# Patient Record
Sex: Female | Born: 1945
Health system: Southern US, Community
[De-identification: ages and names within clinical notes are randomized; demographics above are authoritative.]

## PROBLEM LIST (undated history)

## (undated) DIAGNOSIS — I10 Essential (primary) hypertension: Secondary | ICD-10-CM

## (undated) DIAGNOSIS — E785 Hyperlipidemia, unspecified: Secondary | ICD-10-CM

## (undated) DIAGNOSIS — N189 Chronic kidney disease, unspecified: Secondary | ICD-10-CM

## (undated) DIAGNOSIS — I480 Paroxysmal atrial fibrillation: Secondary | ICD-10-CM

## (undated) DIAGNOSIS — M797 Fibromyalgia: Secondary | ICD-10-CM

## (undated) HISTORY — DX: Hyperlipidemia, unspecified: E78.5

## (undated) HISTORY — DX: Chronic kidney disease, unspecified: N18.9

## (undated) HISTORY — DX: Paroxysmal atrial fibrillation: I48.0

## (undated) HISTORY — DX: Essential (primary) hypertension: I10

---

## 2001-07-01 ENCOUNTER — Emergency Department (HOSPITAL_COMMUNITY): Admission: EM | Admit: 2001-07-01 | Discharge: 2001-07-01 | Payer: Self-pay | Admitting: *Deleted

## 2001-08-01 ENCOUNTER — Ambulatory Visit (HOSPITAL_COMMUNITY): Admission: RE | Admit: 2001-08-01 | Discharge: 2001-08-01 | Payer: Self-pay | Admitting: Internal Medicine

## 2001-08-01 ENCOUNTER — Encounter: Payer: Self-pay | Admitting: Internal Medicine

## 2002-08-06 ENCOUNTER — Ambulatory Visit (HOSPITAL_COMMUNITY): Admission: RE | Admit: 2002-08-06 | Discharge: 2002-08-06 | Payer: Self-pay | Admitting: Internal Medicine

## 2002-08-06 ENCOUNTER — Encounter: Payer: Self-pay | Admitting: Internal Medicine

## 2003-08-18 ENCOUNTER — Encounter: Payer: Self-pay | Admitting: Family Medicine

## 2003-08-18 ENCOUNTER — Ambulatory Visit (HOSPITAL_COMMUNITY): Admission: RE | Admit: 2003-08-18 | Discharge: 2003-08-18 | Payer: Self-pay | Admitting: Family Medicine

## 2004-04-28 ENCOUNTER — Ambulatory Visit (HOSPITAL_COMMUNITY): Admission: RE | Admit: 2004-04-28 | Discharge: 2004-04-28 | Payer: Self-pay | Admitting: Family Medicine

## 2010-11-26 ENCOUNTER — Encounter: Payer: Self-pay | Admitting: Family Medicine

## 2011-07-29 ENCOUNTER — Encounter: Payer: Self-pay | Admitting: *Deleted

## 2011-07-29 ENCOUNTER — Emergency Department (HOSPITAL_COMMUNITY)
Admission: EM | Admit: 2011-07-29 | Discharge: 2011-07-29 | Disposition: A | Discharge status: Home / Self Care | Payer: Medicare Other | Attending: Emergency Medicine | Admitting: Emergency Medicine

## 2011-07-29 ENCOUNTER — Emergency Department (HOSPITAL_COMMUNITY): Payer: Medicare Other

## 2011-07-29 DIAGNOSIS — R509 Fever, unspecified: Secondary | ICD-10-CM | POA: Insufficient documentation

## 2011-07-29 DIAGNOSIS — R05 Cough: Secondary | ICD-10-CM | POA: Insufficient documentation

## 2011-07-29 DIAGNOSIS — I10 Essential (primary) hypertension: Secondary | ICD-10-CM | POA: Insufficient documentation

## 2011-07-29 DIAGNOSIS — J3489 Other specified disorders of nose and nasal sinuses: Secondary | ICD-10-CM | POA: Insufficient documentation

## 2011-07-29 DIAGNOSIS — R059 Cough, unspecified: Secondary | ICD-10-CM | POA: Insufficient documentation

## 2011-07-29 DIAGNOSIS — R079 Chest pain, unspecified: Secondary | ICD-10-CM | POA: Insufficient documentation

## 2011-07-29 DIAGNOSIS — J159 Unspecified bacterial pneumonia: Secondary | ICD-10-CM

## 2011-07-29 MED ORDER — ALBUTEROL SULFATE HFA 108 (90 BASE) MCG/ACT IN AERS: 1.0000 | INHALATION_SPRAY | Freq: Four times a day (QID) | RESPIRATORY_TRACT | Status: DC | PRN

## 2011-07-29 MED ORDER — AZITHROMYCIN 250 MG PO TABS: 250.0000 mg | ORAL_TABLET | Freq: Every day | ORAL | Status: AC

## 2011-07-29 NOTE — ED Provider Notes (Signed)
History     CSN: [id number] Arrival date & time: 07/29/2011  6:42 AM  Chief Complaint  Patient presents with  . Cough  . Nasal Congestion  . Fever  . Nausea    HPI  (Consider location/radiation/quality/duration/timing/severity/associated sxs/prior treatment)  Patient is a 65 y.o. female presenting with cough and fever. The history is provided by the patient.  Cough This is a new problem. The current episode started 2 days ago. The problem occurs constantly. The cough is productive of sputum. There has been no fever. Associated symptoms include chest pain. Pertinent negatives include no chills, no ear pain, no headaches, no sore throat, no myalgias, no shortness of breath, no wheezing and no eye redness.  Fever Primary symptoms of the febrile illness include fever and cough. Primary symptoms do not include headaches, wheezing, shortness of breath, abdominal pain, dysuria or myalgias.  SAW DR Margo Aye ON Friday FOR THIS AND RX WITH ROBITUSSIN DM. ? FEVER BUT NOT DOCUMENTED.   Past Medical History  Diagnosis Date  . Hypertension     History reviewed. No pertinent past surgical history.  History reviewed. No pertinent family history.  History  Substance Use Topics  . Smoking status: Unknown If Ever Smoked  . Smokeless tobacco: Not on file  . Alcohol Use: No    OB History    Grav Para Term Preterm Abortions TAB SAB Ect Mult Living                  Review of Systems  Review of Systems  Constitutional: Positive for fever. Negative for chills.  HENT: Negative for ear pain and sore throat.   Eyes: Negative for redness.  Respiratory: Positive for cough. Negative for shortness of breath and wheezing.   Cardiovascular: Positive for chest pain.  Gastrointestinal: Negative for abdominal pain.  Genitourinary: Negative for dysuria.  Musculoskeletal: Negative for myalgias and back pain.  Neurological: Negative for headaches.    Allergies  Review of patient's allergies indicates  no known allergies.  Home Medications   Current Outpatient Rx  Name Route Sig Dispense Refill  . ASPIRIN 81 MG PO TABS Oral Take 81 mg by mouth daily.      Marland Kitchen UNKNOWN TO PATIENT       . ALBUTEROL SULFATE HFA 108 (90 BASE) MCG/ACT IN AERS Inhalation Inhale 1-2 puffs into the lungs every 6 (six) hours as needed for wheezing. 1 Inhaler 0  . AZITHROMYCIN 250 MG PO TABS Oral Take 1 tablet (250 mg total) by mouth daily. Take first 2 tablets together, then 1 every day until finished. 6 tablet 0    Physical Exam    BP 120/56  Pulse 79  Temp(Src) 99.3 F (37.4 C) (Oral)  Resp 20  Ht 5\' 10"  (1.778 m)  Wt 180 lb (81.647 kg)  BMI 25.83 kg/m2  SpO2 95%  Physical Exam  Nursing note and vitals reviewed. Constitutional: She is oriented to person, place, and time. She appears well-developed and well-nourished.  HENT:  Head: Normocephalic and atraumatic.  Mouth/Throat: Oropharynx is clear and moist.  Eyes: Conjunctivae and EOM are normal. Pupils are equal, round, and reactive to light.  Neck: Normal range of motion. Neck supple.  Cardiovascular: Normal rate, regular rhythm and normal heart sounds.   Pulmonary/Chest: Effort normal and breath sounds normal. No respiratory distress. She has no wheezes. She has no rales. She exhibits tenderness.  Abdominal: Soft. Bowel sounds are normal. There is no tenderness.  Musculoskeletal: Normal range of motion. She exhibits  no edema.  Neurological: She is alert and oriented to person, place, and time. No cranial nerve deficit. She exhibits normal muscle tone. Coordination normal.  Skin: Skin is warm and dry. No rash noted.    ED Course  Procedures (including critical care time)  Labs Reviewed - No data to display Dg Chest 2 View  07/29/2011  *RADIOLOGY REPORT*  Clinical Data: Cough and fever  CHEST - 2 VIEW  Comparison: None.  Findings: Focal airspace disease in the posteromedial left lower lobe.  No effusion.  Heart size upper limits normal.  Regional  bones unremarkable.  IMPRESSION:  1. Posterior left   lower lobe airspace disease suggesting pneumonia.  Original Report Authenticated By: Osa Craver, M.D.     1. Community acquired bacterial pneumonia      MDM cxr cw pneumonia  WILL RX WITH ZITHROMAX AND ALBUTEROL FOR COUGH. ALREADY TAKING ROBITUSIN DM.         Shelda Jakes, MD 07/29/11 [phone]

## 2011-07-29 NOTE — ED Notes (Signed)
Pt c/o cough, congestion, fever, and nausea x 3 days.

## 2011-10-26 ENCOUNTER — Other Ambulatory Visit (HOSPITAL_COMMUNITY): Payer: Self-pay | Admitting: Internal Medicine

## 2011-10-26 DIAGNOSIS — Z139 Encounter for screening, unspecified: Secondary | ICD-10-CM

## 2011-11-02 ENCOUNTER — Ambulatory Visit (HOSPITAL_COMMUNITY)
Admission: RE | Admit: 2011-11-02 | Discharge: 2011-11-02 | Disposition: A | Discharge status: Home / Self Care | Payer: Medicare Other | Source: Ambulatory Visit | Attending: Internal Medicine | Admitting: Internal Medicine

## 2011-11-02 DIAGNOSIS — Z1231 Encounter for screening mammogram for malignant neoplasm of breast: Secondary | ICD-10-CM | POA: Insufficient documentation

## 2011-11-02 DIAGNOSIS — Z139 Encounter for screening, unspecified: Secondary | ICD-10-CM

## 2013-09-12 ENCOUNTER — Emergency Department (HOSPITAL_COMMUNITY)
Admission: EM | Admit: 2013-09-12 | Discharge: 2013-09-12 | Disposition: A | Discharge status: Home / Self Care | Payer: Medicare Other | Attending: Emergency Medicine | Admitting: Emergency Medicine

## 2013-09-12 ENCOUNTER — Encounter (HOSPITAL_COMMUNITY): Payer: Self-pay | Admitting: Emergency Medicine

## 2013-09-12 DIAGNOSIS — I10 Essential (primary) hypertension: Secondary | ICD-10-CM | POA: Insufficient documentation

## 2013-09-12 DIAGNOSIS — M2550 Pain in unspecified joint: Secondary | ICD-10-CM | POA: Insufficient documentation

## 2013-09-12 DIAGNOSIS — Z7982 Long term (current) use of aspirin: Secondary | ICD-10-CM | POA: Insufficient documentation

## 2013-09-12 DIAGNOSIS — R197 Diarrhea, unspecified: Secondary | ICD-10-CM | POA: Insufficient documentation

## 2013-09-12 LAB — BASIC METABOLIC PANEL
CO2: 25 mEq/L (ref 19–32)
Calcium: 9.4 mg/dL (ref 8.4–10.5)
Chloride: 102 mEq/L (ref 96–112)
Creatinine, Ser: 0.98 mg/dL (ref 0.50–1.10)
Glucose, Bld: 98 mg/dL (ref 70–99)

## 2013-09-12 LAB — CBC WITH DIFFERENTIAL/PLATELET
Eosinophils Relative: 0 % (ref 0–5)
HCT: 38.5 % (ref 36.0–46.0)
Hemoglobin: 12.4 g/dL (ref 12.0–15.0)
Lymphocytes Relative: 22 % (ref 12–46)
Lymphs Abs: 1.5 10*3/uL (ref 0.7–4.0)
MCV: 88.7 fL (ref 78.0–100.0)
Monocytes Absolute: 0.7 10*3/uL (ref 0.1–1.0)
Neutro Abs: 4.8 10*3/uL (ref 1.7–7.7)
RBC: 4.34 MIL/uL (ref 3.87–5.11)
WBC: 7.1 10*3/uL (ref 4.0–10.5)

## 2013-09-12 MED ORDER — SODIUM CHLORIDE 0.9 % IV SOLN
1000.0000 mL | Freq: Once | INTRAVENOUS | Status: AC
  Administered 2013-09-12: 1000 mL via INTRAVENOUS

## 2013-09-12 MED ORDER — PB-HYOSCY-ATROPINE-SCOPOLAMINE 16.2 MG/5ML PO ELIX
10.0000 mL | ORAL_SOLUTION | Freq: Once | ORAL | Status: AC
  Administered 2013-09-12: 32.4 mg via ORAL
  Filled 2013-09-12: qty 2

## 2013-09-12 MED ORDER — SODIUM CHLORIDE 0.9 % IV SOLN
1000.0000 mL | INTRAVENOUS | Status: DC
  Administered 2013-09-12: 1000 mL via INTRAVENOUS

## 2013-09-12 NOTE — ED Notes (Signed)
Pt states "really bad diarrhea" began yesterday morning at 0200. Denies any other symptoms. States otc meds are not working.

## 2013-09-12 NOTE — ED Notes (Signed)
Pt c/o diarrhea that started yesterday am, has tried otc medication without any relief, states that she recently has finished up two rounds of antibiotics, last dose was last Tuesday, denies any n/v, pain,

## 2013-09-12 NOTE — ED Provider Notes (Signed)
CSN: [id number]     Arrival date & time 09/12/13  4098 History   First MD Initiated Contact with Patient 09/12/13 [phone]     Chief Complaint  Patient presents with  . Diarrhea   (Consider location/radiation/quality/duration/timing/severity/associated sxs/prior Treatment) Patient is a 67 y.o. female presenting with diarrhea. The history is provided by the patient.  Diarrhea Quality:  Semi-solid Severity:  Moderate Onset quality:  Sudden Duration:  1 day Timing:  Intermittent Progression:  Unchanged Relieved by:  Nothing Ineffective treatments:  Anti-motility medications Associated symptoms: arthralgias   Associated symptoms: no abdominal pain, no chills, no fever and no vomiting   Risk factors: recent antibiotic use   Risk factors: no suspicious food intake     Past Medical History  Diagnosis Date  . Hypertension    Past Surgical History  Procedure Laterality Date  . Cesarean section     No family history on file. History  Substance Use Topics  . Smoking status: Never Smoker   . Smokeless tobacco: Not on file  . Alcohol Use: No   OB History   Grav Para Term Preterm Abortions TAB SAB Ect Mult Living                 Review of Systems  Constitutional: Negative for fever, chills and activity change.       All ROS Neg except as noted in HPI  HENT: Negative for nosebleeds.   Eyes: Negative for photophobia and discharge.  Respiratory: Negative for cough, shortness of breath and wheezing.   Cardiovascular: Negative for chest pain and palpitations.  Gastrointestinal: Positive for diarrhea. Negative for vomiting, abdominal pain and blood in stool.  Genitourinary: Negative for dysuria, frequency and hematuria.  Musculoskeletal: Positive for arthralgias. Negative for back pain and neck pain.  Skin: Negative.   Neurological: Negative for dizziness, seizures and speech difficulty.  Psychiatric/Behavioral: Negative for hallucinations and confusion.    Allergies  Review of  patient's allergies indicates no known allergies.  Home Medications   Current Outpatient Rx  Name  Route  Sig  Dispense  Refill  . EXPIRED: albuterol (PROVENTIL HFA;VENTOLIN HFA) 108 (90 BASE) MCG/ACT inhaler   Inhalation   Inhale 1-2 puffs into the lungs every 6 (six) hours as needed for wheezing.   1 Inhaler   0   . aspirin 81 MG tablet   Oral   Take 81 mg by mouth daily.           Marland Kitchen UNKNOWN TO PATIENT                 BP 123/70  Pulse 70  Temp(Src) 98.4 F (36.9 C) (Oral)  Resp 16  Ht 5\' 2"  (1.575 m)  Wt 165 lb (74.844 kg)  BMI 30.17 kg/m2  SpO2 98% Physical Exam  Nursing note and vitals reviewed. Constitutional: She is oriented to person, place, and time. She appears well-developed and well-nourished.  Non-toxic appearance.  HENT:  Head: Normocephalic.  Right Ear: Tympanic membrane and external ear normal.  Left Ear: Tympanic membrane and external ear normal.  Eyes: EOM and lids are normal. Pupils are equal, round, and reactive to light.  Neck: Normal range of motion. Neck supple. Carotid bruit is not present.  Cardiovascular: Normal rate, regular rhythm, normal heart sounds, intact distal pulses and normal pulses.   Pulmonary/Chest: Breath sounds normal. No respiratory distress.  Abdominal: Soft. Bowel sounds are normal. There is tenderness. There is no guarding.  Mild diffuse tenderness of the abdomen.  Musculoskeletal: Normal range of motion.  Lymphadenopathy:       Head (right side): No submandibular adenopathy present.       Head (left side): No submandibular adenopathy present.    She has no cervical adenopathy.  Neurological: She is alert and oriented to person, place, and time. She has normal strength. No cranial nerve deficit or sensory deficit.  Skin: Skin is warm and dry.  Psychiatric: She has a normal mood and affect. Her speech is normal.    ED Course  Procedures (including critical care time) Labs Review Labs Reviewed - No data to  display Imaging Review No results found.  EKG Interpretation   None       MDM  No diagnosis found. *I have reviewed nursing notes, vital signs, and all appropriate lab and imaging results for this patient.**  Complete blood count is well within normal limits. Basic metabolic panel well within normal limits.  Patient states she feels much better after IV fluids and donnatal . There's been no diarrhea since the patient has been in the emergency department. No significant vital sign changes. IPt to return if any changes or problem.  Kathie Dike, PA-C 09/14/13 [phone]

## 2013-09-12 NOTE — ED Provider Notes (Signed)
Medical screening examination/treatment/procedure(s) were conducted as a shared visit with non-physician practitioner(s) and myself.  I personally evaluated the patient during the encounter.  EKG Interpretation   None       Patient is a 67 y.o. female who presents emergency Department diarrhea. No vomiting. No fever. No abdominal pain. She is hemodynamically stable with a benign abdominal exam. She has not had any diarrhea in the ED. Her labs are unremarkable. Plan is to discharge patient home. She does have stool cultures pending given she recently finished a course of penicillin.  Vanessa Maw Armend Hochstatter, DO 09/12/13 1621

## 2013-09-12 NOTE — ED Notes (Signed)
Pt states she has been on antibiotics lately due to sore throat then for dental procedure.

## 2013-09-23 NOTE — ED Provider Notes (Signed)
Medical screening examination/treatment/procedure(s) were conducted as a shared visit with non-physician practitioner(s) and myself.  I personally evaluated the patient during the encounter.  EKG Interpretation   None         Layla Maw Zephyr Ridley, DO 09/23/13 [phone]

## 2013-11-26 ENCOUNTER — Encounter (HOSPITAL_COMMUNITY): Payer: Self-pay | Admitting: Emergency Medicine

## 2013-11-26 ENCOUNTER — Inpatient Hospital Stay (HOSPITAL_COMMUNITY)
Admission: EM | Admit: 2013-11-26 | Discharge: 2013-11-27 | DRG: 310 | Disposition: A | Discharge status: Home / Self Care | Payer: Medicare Other | Attending: Family Medicine | Admitting: Family Medicine

## 2013-11-26 ENCOUNTER — Emergency Department (HOSPITAL_COMMUNITY): Payer: Medicare Other

## 2013-11-26 DIAGNOSIS — J101 Influenza due to other identified influenza virus with other respiratory manifestations: Secondary | ICD-10-CM | POA: Diagnosis present

## 2013-11-26 DIAGNOSIS — R059 Cough, unspecified: Secondary | ICD-10-CM | POA: Diagnosis present

## 2013-11-26 DIAGNOSIS — I10 Essential (primary) hypertension: Secondary | ICD-10-CM | POA: Diagnosis present

## 2013-11-26 DIAGNOSIS — R0789 Other chest pain: Secondary | ICD-10-CM

## 2013-11-26 DIAGNOSIS — IMO0001 Reserved for inherently not codable concepts without codable children: Secondary | ICD-10-CM | POA: Diagnosis present

## 2013-11-26 DIAGNOSIS — R05 Cough: Secondary | ICD-10-CM

## 2013-11-26 DIAGNOSIS — J111 Influenza due to unidentified influenza virus with other respiratory manifestations: Secondary | ICD-10-CM

## 2013-11-26 DIAGNOSIS — I480 Paroxysmal atrial fibrillation: Secondary | ICD-10-CM | POA: Diagnosis present

## 2013-11-26 DIAGNOSIS — I4891 Unspecified atrial fibrillation: Principal | ICD-10-CM

## 2013-11-26 HISTORY — DX: Fibromyalgia: M79.7

## 2013-11-26 HISTORY — DX: Essential (primary) hypertension: I10

## 2013-11-26 LAB — CBC WITH DIFFERENTIAL/PLATELET
Basophils Absolute: 0 10*3/uL (ref 0.0–0.1)
Basophils Relative: 0 % (ref 0–1)
EOS ABS: 0.1 10*3/uL (ref 0.0–0.7)
EOS PCT: 1 % (ref 0–5)
HCT: 41.7 % (ref 36.0–46.0)
HEMOGLOBIN: 13.4 g/dL (ref 12.0–15.0)
LYMPHS ABS: 1.4 10*3/uL (ref 0.7–4.0)
LYMPHS PCT: 17 % (ref 12–46)
MCH: 28.8 pg (ref 26.0–34.0)
MCHC: 32.1 g/dL (ref 30.0–36.0)
MCV: 89.5 fL (ref 78.0–100.0)
MONO ABS: 1 10*3/uL (ref 0.1–1.0)
Monocytes Relative: 12 % (ref 3–12)
Neutro Abs: 5.4 10*3/uL (ref 1.7–7.7)
Neutrophils Relative %: 69 % (ref 43–77)
Platelets: 179 10*3/uL (ref 150–400)
RBC: 4.66 MIL/uL (ref 3.87–5.11)
RDW: 13.9 % (ref 11.5–15.5)
WBC: 7.8 10*3/uL (ref 4.0–10.5)

## 2013-11-26 LAB — COMPREHENSIVE METABOLIC PANEL
ALT: 11 U/L (ref 0–35)
AST: 15 U/L (ref 0–37)
Albumin: 4.3 g/dL (ref 3.5–5.2)
Alkaline Phosphatase: 135 U/L — ABNORMAL HIGH (ref 39–117)
BILIRUBIN TOTAL: 0.4 mg/dL (ref 0.3–1.2)
BUN: 17 mg/dL (ref 6–23)
CALCIUM: 9.7 mg/dL (ref 8.4–10.5)
CHLORIDE: 100 meq/L (ref 96–112)
CO2: 27 meq/L (ref 19–32)
CREATININE: 0.99 mg/dL (ref 0.50–1.10)
GFR, EST AFRICAN AMERICAN: 66 mL/min — AB (ref >90)
GFR, EST NON AFRICAN AMERICAN: 57 mL/min — AB (ref >90)
GLUCOSE: 97 mg/dL (ref 70–99)
Potassium: 4.4 mEq/L (ref 3.7–5.3)
Sodium: 140 mEq/L (ref 137–147)
Total Protein: 8.1 g/dL (ref 6.0–8.3)

## 2013-11-26 LAB — TROPONIN I: Troponin I: <0.3 ng/mL (ref <0.30)

## 2013-11-26 LAB — APTT: APTT: 33 s (ref 24–37)

## 2013-11-26 LAB — INFLUENZA PANEL BY PCR (TYPE A & B)
H1N1 flu by pcr: NOT DETECTED
Influenza A By PCR: POSITIVE — AB
Influenza B By PCR: NEGATIVE

## 2013-11-26 LAB — PROTIME-INR
INR: 1.02 (ref 0.00–1.49)
PROTHROMBIN TIME: 13.2 s (ref 11.6–15.2)

## 2013-11-26 LAB — MAGNESIUM: MAGNESIUM: 2.1 mg/dL (ref 1.5–2.5)

## 2013-11-26 LAB — PRO B NATRIURETIC PEPTIDE: PRO B NATRI PEPTIDE: 135.7 pg/mL — AB (ref 0–125)

## 2013-11-26 MED ORDER — SODIUM CHLORIDE 0.9 % IV SOLN: 250.0000 mL | INTRAVENOUS | Status: DC | PRN

## 2013-11-26 MED ORDER — LISINOPRIL-HYDROCHLOROTHIAZIDE 20-25 MG PO TABS: 1.0000 | ORAL_TABLET | Freq: Every day | ORAL | Status: DC

## 2013-11-26 MED ORDER — LISINOPRIL 10 MG PO TABS
20.0000 mg | ORAL_TABLET | Freq: Every day | ORAL | Status: DC
  Administered 2013-11-27: 20 mg via ORAL
  Filled 2013-11-26 (×2): qty 2

## 2013-11-26 MED ORDER — OSELTAMIVIR PHOSPHATE 75 MG PO CAPS
75.0000 mg | ORAL_CAPSULE | Freq: Two times a day (BID) | ORAL | Status: DC
  Administered 2013-11-26 – 2013-11-27 (×2): 75 mg via ORAL
  Filled 2013-11-26 (×2): qty 1

## 2013-11-26 MED ORDER — ASPIRIN 81 MG PO CHEW
81.0000 mg | CHEWABLE_TABLET | Freq: Every day | ORAL | Status: DC
  Administered 2013-11-26 – 2013-11-27 (×2): 81 mg via ORAL
  Filled 2013-11-26 (×2): qty 1

## 2013-11-26 MED ORDER — SODIUM CHLORIDE 0.9 % IJ SOLN
3.0000 mL | Freq: Two times a day (BID) | INTRAMUSCULAR | Status: DC
  Administered 2013-11-26: 3 mL via INTRAVENOUS

## 2013-11-26 MED ORDER — GUAIFENESIN-DM 100-10 MG/5ML PO SYRP
5.0000 mL | ORAL_SOLUTION | ORAL | Status: DC | PRN
  Administered 2013-11-26 – 2013-11-27 (×3): 5 mL via ORAL
  Filled 2013-11-26 (×3): qty 5

## 2013-11-26 MED ORDER — ENOXAPARIN SODIUM 80 MG/0.8ML ~~LOC~~ SOLN
1.0000 mg/kg | Freq: Two times a day (BID) | SUBCUTANEOUS | Status: DC
Start: 2013-11-26 — End: 2013-11-27
  Administered 2013-11-26 – 2013-11-27 (×2): 80 mg via SUBCUTANEOUS
  Filled 2013-11-26 (×2): qty 0.8

## 2013-11-26 MED ORDER — SODIUM CHLORIDE 0.9 % IJ SOLN: 3.0000 mL | Freq: Two times a day (BID) | INTRAMUSCULAR | Status: DC

## 2013-11-26 MED ORDER — DILTIAZEM HCL 100 MG IV SOLR
5.0000 mg/h | Freq: Once | INTRAVENOUS | Status: DC
  Filled 2013-11-26: qty 100

## 2013-11-26 MED ORDER — ONDANSETRON HCL 4 MG/2ML IJ SOLN: 4.0000 mg | Freq: Four times a day (QID) | INTRAMUSCULAR | Status: DC | PRN

## 2013-11-26 MED ORDER — ALBUTEROL SULFATE (2.5 MG/3ML) 0.083% IN NEBU: 3.0000 mL | INHALATION_SOLUTION | Freq: Four times a day (QID) | RESPIRATORY_TRACT | Status: DC | PRN

## 2013-11-26 MED ORDER — DILTIAZEM HCL 25 MG/5ML IV SOLN: 10.0000 mg | Freq: Once | INTRAVENOUS | Status: DC

## 2013-11-26 MED ORDER — SODIUM CHLORIDE 0.9 % IJ SOLN: 3.0000 mL | INTRAMUSCULAR | Status: DC | PRN

## 2013-11-26 MED ORDER — ONDANSETRON HCL 4 MG PO TABS
4.0000 mg | ORAL_TABLET | Freq: Four times a day (QID) | ORAL | Status: DC | PRN
Start: 2013-11-26 — End: 2013-11-27

## 2013-11-26 MED ORDER — HYDROCHLOROTHIAZIDE 25 MG PO TABS
25.0000 mg | ORAL_TABLET | Freq: Every day | ORAL | Status: DC
  Administered 2013-11-27: 25 mg via ORAL
  Filled 2013-11-26 (×2): qty 1

## 2013-11-26 NOTE — ED Notes (Signed)
EKG shown to Dr. Tomi Bamberger, Lab called for stat blood work, will move pt in room 2 asap.

## 2013-11-26 NOTE — ED Provider Notes (Signed)
CSN: [id number]     Arrival date & time 11/26/13  1651 History  This chart was scribed for Janice Norrie, MD by Roxan Diesel, ED scribe.  This patient was seen in room APA02/APA02 and the patient's care was started at 5:29 PM.   Chief Complaint  Patient presents with  . Atrial Fibrillation    The history is provided by the patient. No language interpreter was used.    HPI Comments: Vanessa Anderson is a 68 y.o. female with h/o HTN sent from PCP's office to the Emergency Department complaining of atrial fibrillation.  Pt was seen by her PCP this evening for a cough that began yesterday and was told that an EKG shows new-onset rapid A-fib and PCP was unable to rule out ischemic changes.  She was sent here for further evaluation.  Currently her A-fib has resolved since she was placed in her room after triage.  Pt denies any symptoms associated with A-fib.  She notes some central CP from coughing but denies any other CP.  She denies palpitations, leg swelling, SOB, nausea, vomiting, lightheadedness, dizziness, exertional SOB, or orthopnea or PND.  She states she does not feel any differently currently than when she was in A-fib.  She states she had a scratchy throat yesterday but this has since resolved.  Pt reports she was seen by her PCP for a physical 6 days ago and told at that visit that she has an "irregular beat but nothing to worry about."  She had never been told of any other heart problems before that time and has never seen a cardiologist. She does not smoke or drink.  She lives at home with her husband.    PCP Dr [name]   Past Medical History  Diagnosis Date  . Hypertension     Past Surgical History  Procedure Laterality Date  . Cesarean section      No family history on file.   History  Substance Use Topics  . Smoking status: Never Smoker   . Smokeless tobacco: Not on file  . Alcohol Use: No  lives at home Lives with spouse  OB History   Grav Para Term Preterm Abortions  TAB SAB Ect Mult Living                  Review of Systems  HENT: Positive for sore throat.   Respiratory: Positive for cough. Negative for shortness of breath.   Cardiovascular: Positive for chest pain (only on coughing). Negative for palpitations and leg swelling.  Gastrointestinal: Negative for nausea and vomiting.  Neurological: Negative for dizziness and light-headedness.  All other systems reviewed and are negative.     Allergies  Review of patient's allergies indicates no known allergies.  Home Medications   Current Outpatient Rx  Name  Route  Sig  Dispense  Refill  . albuterol (PROVENTIL HFA;VENTOLIN HFA) 108 (90 BASE) MCG/ACT inhaler   Inhalation   Inhale 2 puffs into the lungs every 6 (six) hours as needed for wheezing or shortness of breath.         Marland Kitchen aspirin 81 MG tablet   Oral   Take 81 mg by mouth daily.          Marland Kitchen lisinopril-hydrochlorothiazide (PRINZIDE,ZESTORETIC) 20-25 MG per tablet   Oral   Take 1 tablet by mouth daily.          BP 155/64  Pulse 164  Temp(Src) 100.2 F (37.9 C) (Oral)  Resp 18  Ht  5\' 2"  (1.575 m)  Wt 175 lb (79.379 kg)  BMI 32.00 kg/m2  SpO2 99%   Vital signs normal except low grade temp, tachycardia   Physical Exam  Nursing note and vitals reviewed. Constitutional: She is oriented to person, place, and time. She appears well-developed and well-nourished.  Non-toxic appearance. She does not appear ill. No distress.  HENT:  Head: Normocephalic and atraumatic.  Right Ear: External ear normal.  Left Ear: External ear normal.  Nose: Nose normal. No mucosal edema or rhinorrhea.  Mouth/Throat: Oropharynx is clear and moist and mucous membranes are normal. No dental abscesses or uvula swelling.  Eyes: Conjunctivae and EOM are normal. Pupils are equal, round, and reactive to light.  Neck: Normal range of motion and full passive range of motion without pain. Neck supple.  Cardiovascular: Normal rate, regular rhythm and  normal heart sounds.  Exam reveals no gallop and no friction rub.   No murmur heard. In normal sinus rhythm  Pulmonary/Chest: Effort normal and breath sounds normal. No respiratory distress. She has no wheezes. She has no rhonchi. She has no rales. She exhibits no tenderness and no crepitus.  Abdominal: Soft. Normal appearance and bowel sounds are normal. She exhibits no distension. There is no tenderness. There is no rebound and no guarding.  Musculoskeletal: Normal range of motion. She exhibits no edema and no tenderness.  Moves all extremities well.   Neurological: She is alert and oriented to person, place, and time. She has normal strength. No cranial nerve deficit.  Skin: Skin is warm, dry and intact. No rash noted. No erythema. No pallor.  Psychiatric: She has a normal mood and affect. Her speech is normal and behavior is normal. Her mood appears not anxious.    ED Course  Procedures (including critical care time) Medications  diltiazem (CARDIZEM) injection 10 mg (not administered)  diltiazem (CARDIZEM) 100 mg in dextrose 5 % 100 mL infusion (not administered)   These were ordered but held because pt was in NSR after placed in ED room  DIAGNOSTIC STUDIES: Oxygen Saturation is 99% on room air, normal by my interpretation.    COORDINATION OF CARE: 5:34 PM-Discussed treatment plan which includes CXR and labs with pt at bedside and pt agreed to plan.   When I entered the room to see the patient she was on the monitor. She was noted to be in normal sinus rhythm although her triage EKG had shown atrial fibrillation with fast ventricular response.  Pt remained in NSR. Influenza panel ordered with her respiratory symptoms x 2 days and low grade fever in the ED.   18:36 Dr Sarajane Jews, admit to observation, tele, team 1   Labs Review Results for orders placed during the hospital encounter of 11/26/13  CBC WITH DIFFERENTIAL      Result Value Range   WBC 7.8  4.0 - 10.5 K/uL   RBC 4.66   3.87 - 5.11 MIL/uL   Hemoglobin 13.4  12.0 - 15.0 g/dL   HCT 41.7  36.0 - 46.0 %   MCV 89.5  78.0 - 100.0 fL   MCH 28.8  26.0 - 34.0 pg   MCHC 32.1  30.0 - 36.0 g/dL   RDW 13.9  11.5 - 15.5 %   Platelets 179  150 - 400 K/uL   Neutrophils Relative % 69  43 - 77 %   Neutro Abs 5.4  1.7 - 7.7 K/uL   Lymphocytes Relative 17  12 - 46 %   Lymphs Abs  1.4  0.7 - 4.0 K/uL   Monocytes Relative 12  3 - 12 %   Monocytes Absolute 1.0  0.1 - 1.0 K/uL   Eosinophils Relative 1  0 - 5 %   Eosinophils Absolute 0.1  0.0 - 0.7 K/uL   Basophils Relative 0  0 - 1 %   Basophils Absolute 0.0  0.0 - 0.1 K/uL  TROPONIN I      Result Value Range   Troponin I <0.30  <0.30 ng/mL  COMPREHENSIVE METABOLIC PANEL      Result Value Range   Sodium 140  137 - 147 mEq/L   Potassium 4.4  3.7 - 5.3 mEq/L   Chloride 100  96 - 112 mEq/L   CO2 27  19 - 32 mEq/L   Glucose, Bld 97  70 - 99 mg/dL   BUN 17  6 - 23 mg/dL   Creatinine, Ser 0.99  0.50 - 1.10 mg/dL   Calcium 9.7  8.4 - 10.5 mg/dL   Total Protein 8.1  6.0 - 8.3 g/dL   Albumin 4.3  3.5 - 5.2 g/dL   AST 15  0 - 37 U/L   ALT 11  0 - 35 U/L   Alkaline Phosphatase 135 (*) 39 - 117 U/L   Total Bilirubin 0.4  0.3 - 1.2 mg/dL   GFR calc non Af Amer 57 (*) >90 mL/min   GFR calc Af Amer 66 (*) >90 mL/min  APTT      Result Value Range   aPTT 33  24 - 37 seconds  PROTIME-INR      Result Value Range   Prothrombin Time 13.2  11.6 - 15.2 seconds   INR 1.02  0.00 - 1.49  MAGNESIUM      Result Value Range   Magnesium 2.1  1.5 - 2.5 mg/dL  PRO B NATRIURETIC PEPTIDE      Result Value Range   Pro B Natriuretic peptide (BNP) 135.7 (*) 0 - 125 pg/mL   Laboratory interpretation all normal, thyroid tests pending    Imaging Review Dg Chest Portable 1 View  11/26/2013   CLINICAL DATA:  Cough, hypertension  EXAM: PORTABLE CHEST - 1 VIEW  COMPARISON:  July 29, 2011  FINDINGS: The heart size and mediastinal contours are within normal limits. Both lungs are clear.  The visualized skeletal structures are stable.  IMPRESSION: No active disease.   Electronically Signed   By: Abelardo Diesel M.D.   On: 11/26/2013 18:00    #1 EKG Interpretation    Date/Time:  Thursday November 26 2013 17:00:40 EST Ventricular Rate:  149 PR Interval:    QRS Duration: 80 QT Interval:  278 QTC Calculation: 437 R Axis:   58 Text Interpretation:  Atrial fibrillation with rapid ventricular response ST \\T \ T wave abnormality, consider inferior ischemia No previous ECGs available Confirmed by Nakeia Calvi  MD-I, Braelynn Benning ([id number]) on 11/26/2013 5:03:22 PM           #2 EKG Interpretation    Date/Time:  Thursday November 26 2013 17:30:25 EST Ventricular Rate:  89 PR Interval:  132 QRS Duration: 84 QT Interval:  326 QTC Calculation: 396 R Axis:   61 Text Interpretation:  Normal sinus rhythm Possible Left atrial enlargement Nonspecific ST abnormality When compared with ECG of 26-Nov-2013 17:00, Sinus rhythm has replaced Atrial fibrillation Vent. rate has decreased BY  60 BPM T wave inversion no longer evident in Inferior leads T wave inversion no longer evident in Lateral leads Confirmed by  Clifford Benninger  MD-I, Tarryn Bogdan ([id number]) on 11/26/2013 6:20:44 PM              MDM   1. Cough   2. Chest wall pain   3. Atrial fibrillation with RVR     Plan admission   Rolland Porter, MD, FACEP   I personally performed the services described in this documentation, which was scribed in my presence. The recorded information has been reviewed and considered.  Rolland Porter, MD, FACEP    Janice Norrie, MD 11/26/13 [phone]

## 2013-11-26 NOTE — H&P (Signed)
PCP:   [name], MD   Chief Complaint:  Cough uri  HPI: 68 yo female sent here from pcp for afib with rvr in 160s.  On arrival to ED she had converted spontaneously to nsr.  Pt has started with cough, nasal congestion yesterday.  Low grade fever.  No n/v/d.  No abd pain.  Has chest pain associated with her coughing.  No sick contacts.  No flu vaccine this year.  Cough nonproductive.  Denies malaise or weakness.  No sob.  Review of Systems:  Positive and negative as per HPI otherwise all other systems are negative  Past Medical History: Past Medical History  Diagnosis Date  . Hypertension   . Fibromyalgia    Past Surgical History  Procedure Laterality Date  . Cesarean section      Medications: Prior to Admission medications   Medication Sig Start Date End Date Taking? Authorizing Provider  albuterol (PROVENTIL HFA;VENTOLIN HFA) 108 (90 BASE) MCG/ACT inhaler Inhale 2 puffs into the lungs every 6 (six) hours as needed for wheezing or shortness of breath.   Yes Historical Provider, MD  aspirin 81 MG tablet Take 81 mg by mouth daily.    Yes Historical Provider, MD  lisinopril-hydrochlorothiazide (PRINZIDE,ZESTORETIC) 20-25 MG per tablet Take 1 tablet by mouth daily.   Yes Historical Provider, MD    Allergies:  No Known Allergies  Social History:  reports that she has never smoked. She has never used smokeless tobacco. She reports that she does not drink alcohol or use illicit drugs.  Family History: none  Physical Exam: Filed Vitals:   11/26/13 1655 11/26/13 1830 11/26/13 1900 11/26/13 2000  BP: 155/64 123/53 121/53 125/74  Pulse: 164 87 82 77  Temp: 100.2 F (37.9 C)   100.7 F (38.2 C)  TempSrc: Oral   Oral  Resp: 18 16 13 20   Height: 5\' 2"  (1.575 m)   5\' 2"  (1.575 m)  Weight: 79.379 kg (175 lb)   80.2 kg (176 lb 12.9 oz)  SpO2: 99% 100% 97% 97%   General appearance: alert, cooperative and no distress Head: Normocephalic, without obvious abnormality,  atraumatic Eyes: negative Nose: Nares normal. Septum midline. Mucosa normal. No drainage or sinus tenderness. Neck: no JVD and supple, symmetrical, trachea midline Lungs: clear to auscultation bilaterally Heart: regular rate and rhythm, S1, S2 normal, no murmur, click, rub or gallop Abdomen: soft, non-tender; bowel sounds normal; no masses,  no organomegaly Extremities: extremities normal, atraumatic, no cyanosis or edema Pulses: 2+ and symmetric Skin: Skin color, texture, turgor normal. No rashes or lesions Neurologic: Grossly normal   Labs on Admission:   Recent Labs  11/26/13 1705 11/26/13 1709  NA  --  140  K  --  4.4  CL  --  100  CO2  --  27  GLUCOSE  --  97  BUN  --  17  CREATININE  --  0.99  CALCIUM  --  9.7  MG 2.1  --     Recent Labs  11/26/13 1709  AST 15  ALT 11  ALKPHOS 135*  BILITOT 0.4  PROT 8.1  ALBUMIN 4.3    Recent Labs  11/26/13 1705  WBC 7.8  NEUTROABS 5.4  HGB 13.4  HCT 41.7  MCV 89.5  PLT 179    Recent Labs  11/26/13 1705  TROPONINI <0.30   Radiological Exams on Admission: Dg Chest Portable 1 View  11/26/2013   CLINICAL DATA:  Cough, hypertension  EXAM: PORTABLE CHEST -  1 VIEW  COMPARISON:  July 29, 2011  FINDINGS: The heart size and mediastinal contours are within normal limits. Both lungs are clear. The visualized skeletal structures are stable.  IMPRESSION: No active disease.   Electronically Signed   By: Abelardo Diesel M.D.   On: 11/26/2013 18:00    Assessment/Plan  68 yo female with pafib in/out rvr likely due to influenza illness  Principal Problem:   Atrial fibrillation with rapid ventricular response- now nsr.  Full dose lovenox.  Echo in am.  Tele monitoring for now.  Does have remote h/o afib several years ago.  Active Problems:   Influenza A-  tamiflu started.  Flu pcr positive influenza A.   Hypertension  stable   Cough as above   Paroxysmal a-fib  As above    Vanessa Anderson A 11/26/2013, 9:23 PM

## 2013-11-26 NOTE — ED Notes (Signed)
Patient and family members educated on isolation precaution (mask, handwashing.)

## 2013-11-26 NOTE — ED Notes (Signed)
Late note:  Hold Cardizem for present- patient in NSR  Dr Tomi Bamberger

## 2013-11-26 NOTE — ED Notes (Signed)
Dr. Juel Burrow NP called and reports pt was being seen today for cough x 2 days.  Reports  EKG shows new onset rapid afib and cannot rule out ischemic changes.  PT sent here for evaluation.  Pt denies any chest pain or sob at this time.  Pt says has some chest pain with coughing.   Also reports fever.

## 2013-11-27 ENCOUNTER — Encounter (HOSPITAL_COMMUNITY): Payer: Self-pay | Admitting: Cardiology

## 2013-11-27 DIAGNOSIS — I519 Heart disease, unspecified: Secondary | ICD-10-CM

## 2013-11-27 LAB — TROPONIN I
Troponin I: <0.3 ng/mL (ref <0.30)
Troponin I: <0.3 ng/mL (ref <0.30)

## 2013-11-27 LAB — T4: T4, Total: 8.5 ug/dL (ref 5.0–12.5)

## 2013-11-27 LAB — TSH: TSH: 3.534 u[IU]/mL (ref 0.350–4.500)

## 2013-11-27 MED ORDER — ACETAMINOPHEN 500 MG PO TABS
500.0000 mg | ORAL_TABLET | Freq: Four times a day (QID) | ORAL | Status: DC | PRN
  Administered 2013-11-27 (×2): 500 mg via ORAL
  Filled 2013-11-27 (×2): qty 1

## 2013-11-27 MED ORDER — GUAIFENESIN-DM 100-10 MG/5ML PO SYRP: 5.0000 mL | ORAL_SOLUTION | ORAL | Status: DC | PRN

## 2013-11-27 MED ORDER — OSELTAMIVIR PHOSPHATE 75 MG PO CAPS: 75.0000 mg | ORAL_CAPSULE | Freq: Two times a day (BID) | ORAL | Status: DC

## 2013-11-27 MED ORDER — LISINOPRIL-HYDROCHLOROTHIAZIDE 20-25 MG PO TABS: 1.0000 | ORAL_TABLET | Freq: Every day | ORAL | Status: DC

## 2013-11-27 NOTE — Discharge Summary (Signed)
Patient seen, independently examined and chart reviewed. I agree with exam, assessment and plan discussed with Dyanne Carrel, NP.  Late entry.  68 year old woman sent to the emergency department from primary care physician's office for atrial fibrillation with rapid ventricular response. In the emergency department she converted spontaneously to sinus rhythm prior to treatment. She reported history of upper respiratory symptoms and low-grade fever. Chest pain associated with coughing only.  Admitted for atrial fibrillation with rapid ventricular response, transient and influenza.  PMH HTN Fibromyalgia   Evaluated by cardiology today. She still some cough but feels okay.  Low-grade fever, MAXIMUM TEMPERATURE 100.7. Vitals stable no hypoxia. Appears calm and comfortable. Cardiovascular regular rate and rhythm. Respiratory clear to auscultation bilaterally. Telemetry sinus rhythm.  Basic metabolic panel unremarkable. Troponins negative. CBC normal. TSH normal. Influenza screen positive. Left ventricular ejection fraction 60-65%. Normal wall motion. Grade 2 diastolic dysfunction.  Paroxysmal atrial fibrillation, likely related to acute influenza. CHADS score of 1 and CHADSVASC of 3. Cardiology recommends no anticoagulation and no rate control, followup as an outpatient after she has recovered from influenza. She takes aspirin chronically.  Complete Tamiflu as outpatient.  Stable for discharge  Murray Hodgkins, MD [facility] [phone]

## 2013-11-27 NOTE — Consult Note (Signed)
CARDIOLOGY CONSULT NOTE   Patient ID: Vanessa Anderson MRN: [id number] DOB/AGE: [birth year] 68 y.o.  Admit Date: 11/26/2013 Referring Physician: PTH Primary Physician: [name], MD Consulting Cardiologist: Rozann Lesches MD Reason for Consultation: PAF   Clinical Summary Vanessa Anderson is a 68 y.o.female admitted with atrial fibrillation with RVR after presenting to PCP's office for complaints of dry cough, which had been occuring for two days. She took OTC Mucinex, but cough was non-productive. She was found to be tachycardic in PCP office. EKG demonstrated atrial fibrillation with RVR. She was sent to ER. She was unaware of rapid irregular rhythm.    On arrival BP 155/64, HR 164. Temp of 100.2. She spontaneously converted to NSR without medical intervention and has remained in NSR since that time. CXR was unremarkable. Cardiac markers have been negative for ACS. No metabolic or hematologic lab abnormalities.She is being treated for inifluenza A - positive screen. Coughing has subsided. Echo has been ordered.     She is normally in good health with the exception of hypertension history. She has only been hospitalized for birth of children..    No Known Allergies  Medications Scheduled Medications: . aspirin  81 mg Oral Daily  . enoxaparin (LOVENOX) injection  1 mg/kg Subcutaneous Q12H  . lisinopril  20 mg Oral Daily  . oseltamivir  75 mg Oral BID  . sodium chloride  3 mL Intravenous Q12H  . sodium chloride  3 mL Intravenous Q12H    PRN Medications: sodium chloride, acetaminophen, albuterol, guaiFENesin-dextromethorphan, ondansetron (ZOFRAN) IV, ondansetron, sodium chloride   Past Medical History  Diagnosis Date  . Essential hypertension, benign   . Fibromyalgia     Past Surgical History  Procedure Laterality Date  . Cesarean section      History reviewed. No pertinent family history.  Social History Vanessa Anderson reports that she has never smoked. She has never used  smokeless tobacco. Vanessa Anderson reports that she does not drink alcohol.  Review of Systems Otherwise reviewed and negative except as outlined.  Physical Examination Blood pressure 117/67, pulse 70, temperature 98.8 F (37.1 C), temperature source Oral, resp. rate 20, height 5\' 2"  (1.575 m), weight 176 lb 12.9 oz (80.2 kg), SpO2 96.00%.  Intake/Output Summary (Last 24 hours) at 11/27/13 1151 Last data filed at 11/27/13 0800  Gross per 24 hour  Intake    720 ml  Output      0 ml  Net    720 ml    Telemetry: NSR   Appears comfortable. HEENT: Conjunctiva and lids normal, oropharynx clear. Neck: Supple, no elevated JVP or carotid bruits, no thyromegaly. Lungs: Clear to auscultation, nonlabored breathing at rest. Cardiac: Regular rate and rhythm, no S3 or significant systolic murmur, no pericardial rub. Abdomen: Soft, nontender, bowel sounds present. Extremities: No pitting edema, distal pulses 2+. Skin: Warm and dry. Musculoskeletal: No kyphosis. Neuropsychiatric: Alert and oriented x3, affect grossly appropriate.   Lab Results  Basic Metabolic Panel:  Recent Labs Lab 11/26/13 1705 11/26/13 1709  NA  --  140  K  --  4.4  CL  --  100  CO2  --  27  GLUCOSE  --  97  BUN  --  17  CREATININE  --  0.99  CALCIUM  --  9.7  MG 2.1  --     Liver Function Tests:  Recent Labs Lab 11/26/13 1709  AST 15  ALT 11  ALKPHOS 135*  BILITOT 0.4  PROT 8.1  ALBUMIN 4.3    CBC:  Recent Labs Lab 11/26/13 1705  WBC 7.8  NEUTROABS 5.4  HGB 13.4  HCT 41.7  MCV 89.5  PLT 179    Cardiac Enzymes:  Recent Labs Lab 11/26/13 1705 11/26/13 2210 11/27/13 0321 11/27/13 0923  TROPONINI <0.30 <0.30 <0.30 <0.30    Radiology: Dg Chest Portable 1 View  11/26/2013   CLINICAL DATA:  Cough, hypertension  EXAM: PORTABLE CHEST - 1 VIEW  COMPARISON:  July 29, 2011  FINDINGS: The heart size and mediastinal contours are within normal limits. Both lungs are clear. The visualized  skeletal structures are stable.  IMPRESSION: No active disease.   Electronically Signed   By: Abelardo Diesel M.D.   On: 11/26/2013 18:00    WTU:UEKCMK fib with RVR, with inferior T-wave concavity 149 bpm.    Impression and Recommendations  1. PAF: Likely related to acute illness with febrile state.Marland Kitchen She was unaware of rapid irregular HR. This may have been occuring with associated dry cough over the last two days prior to admission. Since returning to NSR, coughing has almost completely been eliminated,. CHADS score of 1 and CHADSVASC  of 3. Currently in NSR. Echo is pending. Do not think she needs to be on anticoagulation at this time. Will not add rate control medications.  2. Hypertension:  Blood pressure is well controlled to low range on lisinopril and HCTZ. Would stop HCTZ while febrile to avoid dehydration.   3. Flu-Like Symptoms: Continues on Tamiflu and breathing treatments with bedrest. Temp normal this am.  Signed: Phill Myron. Purcell Nails NP [facility] 11/27/2013, 11:51 AM Co-Sign MD   Attending note:  Patient seen and examined. Reviewed records and modified above note by [name] NP. Patient is admitted with recent symptoms of cough and chest congestion, noted to be tachycardiac at PCP office and sent to the ER where she was found to be in rapid atrial fibrillation. She was not aware of any sense of palpitations or chest pain. She spontaneously converted to sinus rhythm without specific intervention, where she remains today on telemetry. Cardiac markers argue against ACS. ECG in sinus rhythm shows leftward axis with nonspecific ST changes. Review of the tracing showing tachycardia finds possible atrial fibrillation following initial sinus rhythm, hard to exclude atrial tachycardia with some regularity in atrial activity. Plan is to followup on echocardiogram today. If no structural abnormalities of significance are noted, would recommend observation for now. She is being  treated for influenza A. When she has recovered, we can see her back in the office to determine if any further monitoring is necessary. She has not been aware of any palpitations, so still possible that she may have paroxysmal arrhythmias that has never been uncovered. Her thromboembolic risk score is relatively low as outlined above, no anticoaguation for now - can discuss at followup.  Satira Sark, M.D., F.A.C.C.

## 2013-11-27 NOTE — Discharge Summary (Signed)
Physician Discharge Summary  Vanessa Anderson FAO:[id number] DOB: April 06, 1946 DOA: 11/26/2013  PCP: [name], MD  Admit date: 11/26/2013 Discharge date: 11/27/2013  Time spent: 45  minutes  Recommendations for Outpatient Follow-up:  1. PCP Dr [name] in 1-2 weeks for evaluation cough/flu and monitoring of HR and BP 2. [name] cardiology on 2/13 to determine if further monitoring needed and get op echo.   Discharge Diagnoses:  Principal Problem:   Atrial fibrillation with rapid ventricular response Active Problems:   Influenza A   Hypertension   Cough   Paroxysmal a-fib   Discharge Condition: stable  Diet recommendation: heart healthy  Filed Weights   11/26/13 1655 11/26/13 2000  Weight: 79.379 kg (175 lb) 80.2 kg (176 lb 12.9 oz)    History of present illness:  68 yo female sent to ED on 11/26/13 from pcp for afib with rvr in 160s. On arrival to ED she had converted spontaneously to nsr. Pt had started with cough, nasal congestion the day before. Low grade fever. No n/v/d. No abd pain. Had chest pain associated with her coughing. No sick contacts. No flu vaccine this year. Cough nonproductive. Denied malaise or weakness. No sob.    Hospital Course:  Atrial fibrillation with rapid ventricular response-  Converted to  NSR in ED without medical intervention. Chest xray unremarkable,TSH within the limits of normal.  Troponin negative x3. Echo to be done as outpatient per cardiology. Remained in nsr for hospitalization. Does have remote h/o afib several years ago. Evaluated by cardiology who opine that review of the tracing showing tachycardia finds possible atrial fibrillation following initial sinus rhythm, hard to exclude atrial tachycardia with some regularity in atrial activity. Pt not aware of any palpitations so possible that she may have paroxysmal arrhythmias. Cardiology recommend no anticoagulation at this time as thromboembolic risk score relatively low. Will follow up  with cardiology for possible further monitoring 12/18/13.    Active Problems:  Influenza A- tamiflu started. Flu pcr positive influenza A. Will continue for 5 day completion. Temp 99.3 at discharge. VSS. Non-toxic appearing. Instructed to maintain hydration with po fluids. Will hold lisinopril and HCTZ until 11/19/13 to avoid dehydration. SBP range at discharge 99-117. Follow up with PCP 1 week.   Hypertension stable: see above Cough: provided with robitussin DM. Much improved at discharge. Oxygen saturation level 96% on room air   Procedures:  Echo  Consultations:  Dr [name] cardiology  Discharge Exam: Filed Vitals:   11/27/13 1300  BP: 117/68  Pulse: 60  Temp: 99.3 F (37.4 C)  Resp: 18    General: well nourished appears comfortable Cardiovascular: RRR No MGR  No LE edema Respiratory: normal effort BS clear bilaterally to ausculation no wheeze or rhonchi  Discharge Instructions     Medication List         albuterol 108 (90 BASE) MCG/ACT inhaler  Commonly known as:  PROVENTIL HFA;VENTOLIN HFA  Inhale 2 puffs into the lungs every 6 (six) hours as needed for wheezing or shortness of breath.     aspirin 81 MG tablet  Take 81 mg by mouth daily.     guaiFENesin-dextromethorphan 100-10 MG/5ML syrup  Commonly known as:  ROBITUSSIN DM  Take 5 mLs by mouth every 4 (four) hours as needed for cough.     lisinopril-hydrochlorothiazide 20-25 MG per tablet  Commonly known as:  PRINZIDE,ZESTORETIC  Take 1 tablet by mouth daily.  Start taking on:  11/29/2013     oseltamivir 75 MG  capsule  Commonly known as:  TAMIFLU  Take 1 capsule (75 mg total) by mouth 2 (two) times daily.       No Known Allergies    The results of significant diagnostics from this hospitalization (including imaging, microbiology, ancillary and laboratory) are listed below for reference.    Significant Diagnostic Studies: Dg Chest Portable 1 View  11/26/2013   CLINICAL DATA:  Cough, hypertension   EXAM: PORTABLE CHEST - 1 VIEW  COMPARISON:  July 29, 2011  FINDINGS: The heart size and mediastinal contours are within normal limits. Both lungs are clear. The visualized skeletal structures are stable.  IMPRESSION: No active disease.   Electronically Signed   By: Abelardo Diesel M.D.   On: 11/26/2013 18:00    Microbiology: No results found for this or any previous visit (from the past 240 hour(s)).   Labs: Basic Metabolic Panel:  Recent Labs Lab 11/26/13 1705 11/26/13 1709  NA  --  140  K  --  4.4  CL  --  100  CO2  --  27  GLUCOSE  --  97  BUN  --  17  CREATININE  --  0.99  CALCIUM  --  9.7  MG 2.1  --    Liver Function Tests:  Recent Labs Lab 11/26/13 1709  AST 15  ALT 11  ALKPHOS 135*  BILITOT 0.4  PROT 8.1  ALBUMIN 4.3   No results found for this basename: LIPASE, AMYLASE,  in the last 168 hours No results found for this basename: AMMONIA,  in the last 168 hours CBC:  Recent Labs Lab 11/26/13 1705  WBC 7.8  NEUTROABS 5.4  HGB 13.4  HCT 41.7  MCV 89.5  PLT 179   Cardiac Enzymes:  Recent Labs Lab 11/26/13 1705 11/26/13 2210 11/27/13 0321 11/27/13 0923  TROPONINI <0.30 <0.30 <0.30 <0.30   BNP: BNP (last 3 results)  Recent Labs  11/26/13 1705  PROBNP 135.7*   CBG: No results found for this basename: GLUCAP,  in the last 168 hours     Signed:  Radene Gunning  [facility] 11/27/2013, 1:55 PM

## 2013-11-27 NOTE — Plan of Care (Signed)
Problem: Discharge Progression Outcomes Goal: Complications resolved/controlled Outcome: Adequate for Discharge Tamiflu ordered

## 2013-11-27 NOTE — Progress Notes (Signed)
Utilization Review Complete  

## 2013-11-27 NOTE — Care Management Note (Signed)
    Page 1 of 1   11/27/2013     2:48:51 PM   CARE MANAGEMENT NOTE 11/27/2013  Patient:  Vanessa Anderson, Vanessa Anderson   Account Number:  [id number]  Date Initiated:  11/27/2013  Documentation initiated by:  Claretha Cooper  Subjective/Objective Assessment:   Pt from home where she lives alone. Independent ADL. Plan to return home. No needs identified or anticipated     Action/Plan:   Anticipated DC Date:     Anticipated DC Plan:  [place]  CM consult      Choice offered to / List presented to:             Status of service:  Completed, signed off Medicare Important Message given?   (If response is "NO", the following Medicare IM given date fields will be blank) Date Medicare IM given:   Date Additional Medicare IM given:    Discharge Disposition:    Per UR Regulation:    If discussed at Long Length of Stay Meetings, dates discussed:    Comments:  11/27/13 Claretha Cooper RN BSN CM

## 2013-12-18 ENCOUNTER — Encounter: Payer: Self-pay | Admitting: Adult Health

## 2013-12-18 ENCOUNTER — Ambulatory Visit (INDEPENDENT_AMBULATORY_CARE_PROVIDER_SITE_OTHER): Payer: Medicare Other | Admitting: Adult Health

## 2013-12-18 VITALS — BP 138/66 | HR 66 | Ht 62.0 in | Wt 173.0 lb

## 2013-12-18 DIAGNOSIS — I10 Essential (primary) hypertension: Secondary | ICD-10-CM

## 2013-12-18 DIAGNOSIS — J111 Influenza due to unidentified influenza virus with other respiratory manifestations: Secondary | ICD-10-CM

## 2013-12-18 DIAGNOSIS — I4891 Unspecified atrial fibrillation: Secondary | ICD-10-CM

## 2013-12-18 DIAGNOSIS — I480 Paroxysmal atrial fibrillation: Secondary | ICD-10-CM

## 2013-12-18 DIAGNOSIS — J101 Influenza due to other identified influenza virus with other respiratory manifestations: Secondary | ICD-10-CM

## 2013-12-18 MED ORDER — AMLODIPINE BESYLATE 5 MG PO TABS: 5.0000 mg | ORAL_TABLET | Freq: Every day | ORAL | Status: DC

## 2013-12-18 MED ORDER — HYDROCHLOROTHIAZIDE 25 MG PO TABS: 25.0000 mg | ORAL_TABLET | Freq: Every day | ORAL | Status: DC

## 2013-12-18 NOTE — Progress Notes (Deleted)
Name: Vanessa Anderson    DOB: May 19, 1946  Age: 68 y.o.  MR#: [id number]       PCP:  [name], MD      Insurance: Payor: [employer] MEDICARE / Plan: AARP MEDICARE COMPLETE / Product Type: *No Product type* /   CC:    Chief Complaint  Patient presents with  . Atrial Fibrillation    VS Filed Vitals:   12/18/13 1323  BP: 138/66  Pulse: 66  Height: 5\' 2"  (1.575 m)  Weight: 173 lb (78.472 kg)  SpO2: 100%    Weights Current Weight  12/18/13 173 lb (78.472 kg)  11/26/13 176 lb 12.9 oz (80.2 kg)  09/12/13 165 lb (74.844 kg)    Blood Pressure  BP Readings from Last 3 Encounters:  12/18/13 138/66  11/27/13 117/68  09/12/13 123/70     Admit date:  (Not on file) Last encounter with RMR:  Visit date not found   Allergy Review of patient's allergies indicates no known allergies.  Current Outpatient Prescriptions  Medication Sig Dispense Refill  . albuterol (PROVENTIL HFA;VENTOLIN HFA) 108 (90 BASE) MCG/ACT inhaler Inhale 2 puffs into the lungs every 6 (six) hours as needed for wheezing or shortness of breath.      Marland Kitchen aspirin 81 MG tablet Take 81 mg by mouth daily.       Marland Kitchen guaiFENesin-dextromethorphan (ROBITUSSIN DM) 100-10 MG/5ML syrup Take 5 mLs by mouth every 4 (four) hours as needed for cough.  118 mL  0  . lisinopril-hydrochlorothiazide (PRINZIDE,ZESTORETIC) 20-25 MG per tablet Take 1 tablet by mouth daily.       No current facility-administered medications for this visit.    Discontinued Meds:    Medications Discontinued During This Encounter  Medication Reason  . oseltamivir (TAMIFLU) 75 MG capsule Error    Patient Active Problem List   Diagnosis Date Noted  . Atrial fibrillation with rapid ventricular response 11/26/2013  . Influenza A 11/26/2013  . Cough 11/26/2013  . Paroxysmal a-fib 11/26/2013  . Hypertension     LABS    Component Value Date/Time   NA 140 11/26/2013 1709   NA 136 09/12/2013 1005   K 4.4 11/26/2013 1709   K 3.5 09/12/2013 1005   CL  100 11/26/2013 1709   CL 102 09/12/2013 1005   CO2 27 11/26/2013 1709   CO2 25 09/12/2013 1005   GLUCOSE 97 11/26/2013 1709   GLUCOSE 98 09/12/2013 1005   BUN 17 11/26/2013 1709   BUN 18 09/12/2013 1005   CREATININE 0.99 11/26/2013 1709   CREATININE 0.98 09/12/2013 1005   CALCIUM 9.7 11/26/2013 1709   CALCIUM 9.4 09/12/2013 1005   GFRNONAA 57* 11/26/2013 1709   GFRNONAA 58* 09/12/2013 1005   GFRAA 66* 11/26/2013 1709   GFRAA 68* 09/12/2013 1005   CMP     Component Value Date/Time   NA 140 11/26/2013 1709   K 4.4 11/26/2013 1709   CL 100 11/26/2013 1709   CO2 27 11/26/2013 1709   GLUCOSE 97 11/26/2013 1709   BUN 17 11/26/2013 1709   CREATININE 0.99 11/26/2013 1709   CALCIUM 9.7 11/26/2013 1709   PROT 8.1 11/26/2013 1709   ALBUMIN 4.3 11/26/2013 1709   AST 15 11/26/2013 1709   ALT 11 11/26/2013 1709   ALKPHOS 135* 11/26/2013 1709   BILITOT 0.4 11/26/2013 1709   GFRNONAA 57* 11/26/2013 1709   GFRAA 66* 11/26/2013 1709       Component Value Date/Time   WBC 7.8 11/26/2013 1705  WBC 7.1 09/12/2013 1005   HGB 13.4 11/26/2013 1705   HGB 12.4 09/12/2013 1005   HCT 41.7 11/26/2013 1705   HCT 38.5 09/12/2013 1005   MCV 89.5 11/26/2013 1705   MCV 88.7 09/12/2013 1005    Lipid Panel  No results found for this basename: chol, trig, hdl, cholhdl, vldl, ldlcalc    ABG No results found for this basename: phart, pco2, pco2art, po2, po2art, hco3, tco2, acidbasedef, o2sat     Lab Results  Component Value Date   TSH 3.534 11/26/2013   BNP (last 3 results)  Recent Labs  11/26/13 1705  PROBNP 135.7*   Cardiac Panel (last 3 results) No results found for this basename: CKTOTAL, CKMB, TROPONINI, RELINDX,  in the last 72 hours  Iron/TIBC/Ferritin No results found for this basename: iron, tibc, ferritin     EKG Orders placed during the hospital encounter of 11/26/13  . EKG 12-LEAD  . EKG 12-LEAD  . ED EKG  . ED EKG  . EKG 12-LEAD  . EKG 12-LEAD  . EKG     Prior Assessment and Plan Problem List as  of 12/18/2013     Cardiovascular and Mediastinum   Atrial fibrillation with rapid ventricular response   Hypertension   Paroxysmal a-fib     Respiratory   Influenza A     Other   Cough       Imaging: Dg Chest Portable 1 View  11/26/2013   CLINICAL DATA:  Cough, hypertension  EXAM: PORTABLE CHEST - 1 VIEW  COMPARISON:  July 29, 2011  FINDINGS: The heart size and mediastinal contours are within normal limits. Both lungs are clear. The visualized skeletal structures are stable.  IMPRESSION: No active disease.   Electronically Signed   By: Abelardo Diesel M.D.   On: 11/26/2013 18:00

## 2013-12-18 NOTE — Assessment & Plan Note (Signed)
The patient complains of a tickle cough on Prinzide. This may be a inhibitor induced cough. I will discontinue lisinopril, and changed amlodipine 5 mg daily, and continue HCTZ 25 mg daily separately. Her blood pressure is well-controlled currently on Prinzide. If the tickle cough does not go away with discontinuation of the ACE inhibitor, I have explained to her that it is probably not related to the medication. She is to followup with Dr. [name] in 2 weeks for evaluation posthospitalization. She will check in with him concerning the tickle cough and will also have blood pressure checked. If she has not had any change in the cough, I would recommend that she return to the Prinzide as this is working well for her.  She will followup with Dr. [name] in one month. I will probably see him on a yearly basis if her blood pressure and heart rate remained controlled.

## 2013-12-18 NOTE — Patient Instructions (Signed)
Your physician recommends that you schedule a follow-up appointment in: 1 month   Your physician has recommended you make the following change in your medication:  1. STOP lisinopril/HCTZ 2. Start HCTZ 25 mg daily 3. START Amlodipine 5 mg daily  Your physician has requested that you regularly monitor and record your blood pressure readings at home. Please use the same machine at the same time of day to check your readings and record them to bring to your follow-up visit.

## 2013-12-18 NOTE — Progress Notes (Signed)
    HPI: [name] is a 68 year old female patient of Dr. [name] we are following posthospitalization after admission for atrial fibrillation with RVR. The patient was also being treated for the flu. The patient spontaneously converted to normal sinus rhythm without medical intervention and remained in normal sinus rhythm during hospitalization.    CHADS-VASC was  found to be 3, but was not placed on anticoagulation and she remained in normal sinus rhythm. She was negative for ischemia and ruled out for myocardial infarction. She was discharged and was treated with Tamiflu for flu. Other history includes hypertension.   Her main complaint today is a typical cough. She has been on Prinzide for years, and has noticed that the tickle cough has become worse. She is denying any rapid heart rhythms, palpitations, edema, or chest discomfort. She is medically compliant.   No Known Allergies  Current Outpatient Prescriptions  Medication Sig Dispense Refill  . albuterol (PROVENTIL HFA;VENTOLIN HFA) 108 (90 BASE) MCG/ACT inhaler Inhale 2 puffs into the lungs every 6 (six) hours as needed for wheezing or shortness of breath.      Marland Kitchen aspirin 81 MG tablet Take 81 mg by mouth daily.       Marland Kitchen guaiFENesin-dextromethorphan (ROBITUSSIN DM) 100-10 MG/5ML syrup Take 5 mLs by mouth every 4 (four) hours as needed for cough.  118 mL  0  . amLODipine (NORVASC) 5 MG tablet Take 1 tablet (5 mg total) by mouth daily.  90 tablet  3  . hydrochlorothiazide (HYDRODIURIL) 25 MG tablet Take 1 tablet (25 mg total) by mouth daily.  90 tablet  3   No current facility-administered medications for this visit.    Past Medical History  Diagnosis Date  . Essential hypertension, benign   . Fibromyalgia     Past Surgical History  Procedure Laterality Date  . Cesarean section      ROS: Review of systems complete and found to be negative unless listed above  PHYSICAL EXAM BP 138/66  Pulse 66  Ht 5\' 2"  (1.575 m)  Wt 173 lb  (78.472 kg)  BMI 31.63 kg/m2  SpO2 100%  General: Well developed, well nourished, in no acute distress Head: Eyes PERRLA, No xanthomas.   Normal cephalic and atramatic  Lungs: Clear bilaterally to auscultation and percussion. Heart: HRRR S1 S2, without MRG.  Pulses are 2+ & equal.            No carotid bruit. No JVD.  No abdominal bruits. No femoral bruits. Abdomen: Bowel sounds are positive, abdomen soft and non-tender without masses or                  Hernia's noted. Msk:  Back normal, normal gait. Normal strength and tone for age. Extremities: No clubbing, cyanosis or edema.  DP +1 Neuro: Alert and oriented X 3. Psych:  Good affect, responds appropriately    ASSESSMENT AND PLAN

## 2013-12-18 NOTE — Assessment & Plan Note (Signed)
Heart rhythm remains regular. She has had no recurrence of rapid heart rhythm. She will not be placed on anticoagulation at this time. Should not be placed and any rate control medications. See her again in one month for ongoing assessment.

## 2013-12-18 NOTE — Assessment & Plan Note (Signed)
She has recovered well. She denies any flulike symptoms. She will follow with her PCP.

## 2014-01-29 ENCOUNTER — Encounter: Payer: Self-pay | Admitting: Cardiology

## 2014-01-29 ENCOUNTER — Ambulatory Visit (INDEPENDENT_AMBULATORY_CARE_PROVIDER_SITE_OTHER): Payer: Medicare Other | Admitting: Cardiology

## 2014-01-29 VITALS — BP 135/41 | HR 61 | Ht 62.0 in | Wt 171.0 lb

## 2014-01-29 DIAGNOSIS — I4891 Unspecified atrial fibrillation: Secondary | ICD-10-CM

## 2014-01-29 DIAGNOSIS — I480 Paroxysmal atrial fibrillation: Secondary | ICD-10-CM

## 2014-01-29 DIAGNOSIS — I10 Essential (primary) hypertension: Secondary | ICD-10-CM

## 2014-01-29 NOTE — Patient Instructions (Signed)
Your physician wants you to follow-up  On as needed     Your physician recommends that you continue on your current medications as directed. Please refer to the Current Medication list given to you today.    Thank you for choosing [facility] !

## 2014-01-29 NOTE — Assessment & Plan Note (Signed)
Cough has resolved off ACE inhibitor, she is tolerating Norvasc. Would recommend follow with Dr. [name] at this point for medication adjustments and refills.

## 2014-01-29 NOTE — Assessment & Plan Note (Signed)
Single episode noted with influenza A back in January. No recurrence. Would recommend observation for now. She will stay on aspirin at this point. Keep follow with Dr. [name], we can see her back if she has recurring arrhythmias.

## 2014-01-29 NOTE — Progress Notes (Signed)
    Clinical Summary Vanessa Anderson is a 68 y.o.female last seen in the office by [name] NP in February of this year. She had been seen as an inpatient consult in January with atrial fibrillation in the setting of influenza that converted spontaneously to sinus rhythm with rate control. She was not anticoagulated (CHADS score of 1, CHADSVASC score of 3) particularly without evidence of recurrence as yet. Medications were adjusted at the last visit for management of hypertension, also cough possibly related to ACE inhibitor. Her primary care physician is Vanessa Anderson.  Echocardiogram from January of this year demonstrated normal LV wall thickness and chamber size with LVEF 13-08%, grade 2 diastolic dysfunction, mild left atrial enlargement, mildly thickened mitral valve with trivial mitral regurgitation, normal PASP 25 mm mercury, trivial posterior pericardial effusion.   She is here with her daughter today. Generally doing well, no palpitations or chest pain. States she has recovered completely from the flu. Also states that her dry cough has stopped since off ACE inhibitor. She is tolerating Norvasc.   No Known Allergies  Current Outpatient Prescriptions  Medication Sig Dispense Refill  . albuterol (PROVENTIL HFA;VENTOLIN HFA) 108 (90 BASE) MCG/ACT inhaler Inhale 2 puffs into the lungs every 6 (six) hours as needed for wheezing or shortness of breath.      Marland Kitchen amLODipine (NORVASC) 5 MG tablet Take 1 tablet (5 mg total) by mouth daily.  90 tablet  3  . aspirin 81 MG tablet Take 81 mg by mouth daily.       . hydrochlorothiazide (HYDRODIURIL) 25 MG tablet Take 1 tablet (25 mg total) by mouth daily.  90 tablet  3   No current facility-administered medications for this visit.    Past Medical History  Diagnosis Date  . Essential hypertension, benign   . Fibromyalgia   . Atrial fibrillation     In the setting of influenza    Social History Vanessa Anderson reports that she has never smoked. She has  never used smokeless tobacco. Vanessa Anderson reports that she does not drink alcohol.  Review of Systems Negative except as outlined.  Physical Examination Filed Vitals:   01/29/14 0931  BP: 135/41  Pulse: 61   Filed Weights   01/29/14 0931  Weight: 171 lb (77.565 kg)   Patient appears comfortable at rest. HEENT: Conjunctiva and lids normal, oropharynx clear. Neck: Supple, no elevated JVP or carotid bruits, no thyromegaly. Lungs: Clear to auscultation, nonlabored breathing at rest. Cardiac: Regular rate and rhythm, no S3 or significant systolic murmur, no pericardial rub. Abdomen: Soft, nontender, bowel sounds present. Extremities: No pitting edema, distal pulses 2+.   Problem List and Plan   PAF (paroxysmal atrial fibrillation) Single episode noted with influenza A back in January. No recurrence. Would recommend observation for now. She will stay on aspirin at this point. Keep follow with Vanessa Anderson, we can see her back if she has recurring arrhythmias.  Essential hypertension, benign Cough has resolved off ACE inhibitor, she is tolerating Norvasc. Would recommend follow with Vanessa Anderson at this point for medication adjustments and refills.    Vanessa Anderson, M.D., F.A.C.C.

## 2014-04-20 ENCOUNTER — Telehealth: Payer: Self-pay

## 2014-04-20 NOTE — Telephone Encounter (Signed)
Pt was referred by Dr. Wende Neighbors for screening colonoscopy.

## 2014-04-20 NOTE — Telephone Encounter (Signed)
Pt received letter from triage nurse. Please call her back at [phone]

## 2014-04-27 ENCOUNTER — Other Ambulatory Visit: Payer: Self-pay

## 2014-04-27 ENCOUNTER — Telehealth: Payer: Self-pay

## 2014-04-27 DIAGNOSIS — Z1211 Encounter for screening for malignant neoplasm of colon: Secondary | ICD-10-CM

## 2014-04-28 NOTE — Telephone Encounter (Signed)
See separate triage.  

## 2014-05-06 NOTE — Telephone Encounter (Signed)
Gastroenterology Pre-Procedure Review  Request Date: 05/06/2014 Requesting Physician: Dr. Wende Neighbors  PATIENT REVIEW QUESTIONS: The patient responded to the following health history questions as indicated:    1. Diabetes Melitis: no 2. Joint replacements in the past 12 months: no 3. Major health problems in the past 3 months: no 4. Has an artificial valve or MVP: no 5. Has a defibrillator: no 6. Has been advised in past to take antibiotics in advance of a procedure like teeth cleaning: no    MEDICATIONS & ALLERGIES:    Patient reports the following regarding taking any blood thinners:   Plavix? no Aspirin? YES Coumadin? no  Patient confirms/reports the following medications:  Current Outpatient Prescriptions  Medication Sig Dispense Refill  . amLODipine (NORVASC) 5 MG tablet Take 1 tablet (5 mg total) by mouth daily.  90 tablet  3  . aspirin 81 MG tablet Take 81 mg by mouth daily.       . hydrochlorothiazide (HYDRODIURIL) 25 MG tablet Take 1 tablet (25 mg total) by mouth daily.  90 tablet  3  . albuterol (PROVENTIL HFA;VENTOLIN HFA) 108 (90 BASE) MCG/ACT inhaler Inhale 2 puffs into the lungs every 6 (six) hours as needed for wheezing or shortness of breath.       No current facility-administered medications for this visit.    Patient confirms/reports the following allergies:  No Known Allergies  No orders of the defined types were placed in this encounter.    AUTHORIZATION INFORMATION Primary Insurance:  ID #:   Group #:  Pre-Cert / Auth required: Pre-Cert / Auth #:   Secondary Insurance:   ID #:   Group #:  Pre-Cert / Auth required: Pre-Cert / Auth #:   SCHEDULE INFORMATION: Procedure has been scheduled as follows:  Date:  05/28/2014                        Time: 8:30 AM  Location: [facility] Short Stay  This Gastroenterology Pre-Precedure Review Form is being routed to the following provider(s): Barney Drain, MD

## 2014-05-10 NOTE — Telephone Encounter (Signed)
MOVI PREP SPLIT DOSING, FULL LIQUIDS WITH BREAKFAST.

## 2014-05-11 MED ORDER — PEG-KCL-NACL-NASULF-NA ASC-C 100 G PO SOLR: 1.0000 | ORAL | Status: DC

## 2014-05-11 NOTE — Telephone Encounter (Signed)
Rx sent to the pharmacy and instructions mailed to pt.  

## 2014-05-17 ENCOUNTER — Other Ambulatory Visit (HOSPITAL_COMMUNITY): Payer: Self-pay | Admitting: Internal Medicine

## 2014-05-17 DIAGNOSIS — Z1231 Encounter for screening mammogram for malignant neoplasm of breast: Secondary | ICD-10-CM

## 2014-05-21 ENCOUNTER — Ambulatory Visit (HOSPITAL_COMMUNITY)
Admission: RE | Admit: 2014-05-21 | Discharge: 2014-05-21 | Disposition: A | Discharge status: Home / Self Care | Payer: Medicare Other | Source: Ambulatory Visit | Attending: Internal Medicine | Admitting: Internal Medicine

## 2014-05-21 DIAGNOSIS — Z1231 Encounter for screening mammogram for malignant neoplasm of breast: Secondary | ICD-10-CM | POA: Insufficient documentation

## 2014-05-24 ENCOUNTER — Encounter (HOSPITAL_COMMUNITY): Payer: Self-pay | Admitting: Pharmacy Technician

## 2014-05-25 ENCOUNTER — Telehealth: Payer: Self-pay

## 2014-05-25 NOTE — Telephone Encounter (Signed)
Pt is scheduled for colonoscopy on 05/28/2014. She has not had any new medical problems and no change in her medications.

## 2014-05-27 NOTE — Telephone Encounter (Signed)
REVIEWED.  

## 2014-05-28 ENCOUNTER — Ambulatory Visit (HOSPITAL_COMMUNITY): Payer: Medicare Other

## 2014-05-28 ENCOUNTER — Ambulatory Visit (HOSPITAL_COMMUNITY)
Admission: RE | Admit: 2014-05-28 | Discharge: 2014-05-28 | Disposition: A | Discharge status: Home / Self Care | Payer: Medicare Other | Source: Ambulatory Visit | Attending: Gastroenterology | Admitting: Gastroenterology

## 2014-05-28 ENCOUNTER — Encounter (HOSPITAL_COMMUNITY)
Admission: RE | Disposition: A | Discharge status: Home / Self Care | Payer: Self-pay | Source: Ambulatory Visit | Attending: Gastroenterology

## 2014-05-28 ENCOUNTER — Encounter (HOSPITAL_COMMUNITY): Payer: Self-pay | Admitting: *Deleted

## 2014-05-28 DIAGNOSIS — K648 Other hemorrhoids: Secondary | ICD-10-CM | POA: Insufficient documentation

## 2014-05-28 DIAGNOSIS — Z79899 Other long term (current) drug therapy: Secondary | ICD-10-CM | POA: Insufficient documentation

## 2014-05-28 DIAGNOSIS — Z7982 Long term (current) use of aspirin: Secondary | ICD-10-CM | POA: Insufficient documentation

## 2014-05-28 DIAGNOSIS — K5520 Angiodysplasia of colon without hemorrhage: Secondary | ICD-10-CM | POA: Insufficient documentation

## 2014-05-28 DIAGNOSIS — Z1211 Encounter for screening for malignant neoplasm of colon: Secondary | ICD-10-CM | POA: Diagnosis not present

## 2014-05-28 DIAGNOSIS — I10 Essential (primary) hypertension: Secondary | ICD-10-CM | POA: Diagnosis not present

## 2014-05-28 DIAGNOSIS — Q438 Other specified congenital malformations of intestine: Secondary | ICD-10-CM | POA: Diagnosis not present

## 2014-05-28 HISTORY — PX: COLONOSCOPY: SHX5424

## 2014-05-28 SURGERY — COLONOSCOPY
Anesthesia: Moderate Sedation

## 2014-05-28 MED ORDER — MEPERIDINE HCL 100 MG/ML IJ SOLN
INTRAMUSCULAR | Status: AC
  Filled 2014-05-28: qty 2

## 2014-05-28 MED ORDER — MIDAZOLAM HCL 5 MG/5ML IJ SOLN
INTRAMUSCULAR | Status: DC | PRN
  Administered 2014-05-28 (×2): 2 mg via INTRAVENOUS
  Administered 2014-05-28: 1 mg via INTRAVENOUS

## 2014-05-28 MED ORDER — STERILE WATER FOR IRRIGATION IR SOLN
Status: DC | PRN
  Administered 2014-05-28: 09:00:00

## 2014-05-28 MED ORDER — MIDAZOLAM HCL 5 MG/5ML IJ SOLN
INTRAMUSCULAR | Status: AC
  Filled 2014-05-28: qty 5

## 2014-05-28 MED ORDER — MEPERIDINE HCL 100 MG/ML IJ SOLN
INTRAMUSCULAR | Status: DC | PRN
  Administered 2014-05-28 (×2): 25 mg via INTRAVENOUS

## 2014-05-28 MED ORDER — MIDAZOLAM HCL 5 MG/5ML IJ SOLN
INTRAMUSCULAR | Status: AC
  Filled 2014-05-28: qty 10

## 2014-05-28 MED ORDER — SODIUM CHLORIDE 0.9 % IV SOLN
INTRAVENOUS | Status: DC
  Administered 2014-05-28: 08:00:00 via INTRAVENOUS

## 2014-05-28 NOTE — H&P (Signed)
  Primary Care Physician:  [name], MD Primary Gastroenterologist:  Dr. Oneida Alar  Pre-Procedure History & Physical: HPI:  Vanessa Anderson is a 68 y.o. female here for [place].   Past Medical History  Diagnosis Date  . Essential hypertension, benign   . Fibromyalgia   . Atrial fibrillation     In the setting of influenza    Past Surgical History  Procedure Laterality Date  . Cesarean section      Prior to Admission medications   Medication Sig Start Date End Date Taking? Authorizing Provider  amLODipine (NORVASC) 5 MG tablet Take 1 tablet (5 mg total) by mouth daily. 12/18/13  Yes Lendon Colonel, NP  aspirin 81 MG tablet Take 81 mg by mouth daily.    Yes Historical Provider, MD  hydrochlorothiazide (HYDRODIURIL) 25 MG tablet Take 1 tablet (25 mg total) by mouth daily. 12/18/13  Yes Lendon Colonel, NP  peg 3350 powder (MOVIPREP) 100 G SOLR Take 1 kit by mouth as directed. 05/11/14  Yes Danie Binder, MD  albuterol (PROVENTIL HFA;VENTOLIN HFA) 108 (90 BASE) MCG/ACT inhaler Inhale 2 puffs into the lungs every 6 (six) hours as needed for wheezing or shortness of breath.    Historical Provider, MD    Allergies as of 04/27/2014  . (No Known Allergies)    History reviewed. No pertinent family history.  History   Social History  . Marital Status: Married    Spouse Name: N/A    Number of Children: N/A  . Years of Education: N/A   Occupational History  . Not on file.   Social History Main Topics  . Smoking status: Never Smoker   . Smokeless tobacco: Never Used  . Alcohol Use: No  . Drug Use: No  . Sexual Activity: Not Currently   Other Topics Concern  . Not on file   Social History Narrative  . No narrative on file    Review of Systems: See HPI, otherwise negative ROS   Physical Exam: BP 143/55  Pulse 57  Temp(Src) 98.5 F (36.9 C) (Oral)  Resp 18  Ht $R'5\' 2"'OH$  (1.575 m)  SpO2 99% General:   Alert,  pleasant and cooperative in NAD Head:   Normocephalic and atraumatic. Neck:  Supple; Lungs:  Clear throughout to auscultation.    Heart:  Regular rate and rhythm. Abdomen:  Soft, nontender and nondistended. Normal bowel sounds, without guarding, and without rebound.   Neurologic:  Alert and  oriented x4;  grossly normal neurologically.  Impression/Plan:      SCREENING  Plan:  1. TCS TODAY

## 2014-05-28 NOTE — Progress Notes (Signed)
Dr Oneida Alar in to talk with pt. To Radiology via wc for stat KUB.

## 2014-05-28 NOTE — Progress Notes (Signed)
Back from Radiology. Dr Oneida Alar in to talk with pt and family. Patient discharged.

## 2014-05-28 NOTE — Discharge Instructions (Signed)
You have internal hemorrhoids. YOU DID NOT HAVE ANY POLYPS. YOU DO HAVE A DIVERTICULUM IN YOUR RECTUM AND SMALL AVMS.Marland Kitchen   Next colonoscopy in 10-15 years IF THE BENEFITS OUTWEIGH THE RISKS.  FOLLOW A HIGH FIBER DIET. AVOID ITEMS THAT CAUSE BLOATING. SEE INFO BELOW.  YOU NEED TO SEE a CT SCAN OF YOUR PELVIS WITH RECTAL CONTRAST NEXT WEEK AND WE WILL MAKE AN APPT FOR [facility] IN 2 WEEKS TO EVALUATE THE BLADDER.  Colonoscopy Care After Read the instructions outlined below and refer to this sheet in the next week. These discharge instructions provide you with general information on caring for yourself after you leave the hospital. While your treatment has been planned according to the most current medical practices available, unavoidable complications occasionally occur. If you have any problems or questions after discharge, call DR. Omarie Parcell, [phone].  ACTIVITY  You may resume your regular activity, but move at a slower pace for the next 24 hours.   Take frequent rest periods for the next 24 hours.   Walking will help get rid of the air and reduce the bloated feeling in your belly (abdomen).   No driving for 24 hours (because of the medicine (anesthesia) used during the test).   You may shower.   Do not sign any important legal documents or operate any machinery for 24 hours (because of the anesthesia used during the test).    NUTRITION  Drink plenty of fluids.   You may resume your normal diet as instructed by your doctor.   Begin with a light meal and progress to your normal diet. Heavy or fried foods are harder to digest and may make you feel sick to your stomach (nauseated).   Avoid alcoholic beverages for 24 hours or as instructed.    MEDICATIONS  You may resume your normal medications.   WHAT YOU CAN EXPECT TODAY  Some feelings of bloating in the abdomen.   Passage of more gas than usual.   Spotting of blood in your stool or on the toilet paper  .  IF  YOU HAD POLYPS REMOVED DURING THE COLONOSCOPY:  Eat a soft diet IF YOU HAVE NAUSEA, BLOATING, ABDOMINAL PAIN, OR VOMITING.    FINDING OUT THE RESULTS OF YOUR TEST Not all test results are available during your visit. DR. Oneida Alar WILL CALL YOU WITHIN 7 DAYS OF YOUR PROCEDUE WITH YOUR RESULTS. Do not assume everything is normal if you have not heard from DR. Taiwan Talcott IN ONE WEEK, CALL HER OFFICE AT [phone].  SEEK IMMEDIATE MEDICAL ATTENTION AND CALL THE OFFICE: [phone] IF:  You have more than a spotting of blood in your stool.   Your belly is swollen (abdominal distention).   You are nauseated or vomiting.   You have a temperature over 101F.   You have abdominal pain or discomfort that is severe or gets worse throughout the day.  High-Fiber Diet A high-fiber diet changes your normal diet to include more whole grains, legumes, fruits, and vegetables. Changes in the diet involve replacing refined carbohydrates with unrefined foods. The calorie level of the diet is essentially unchanged. The Dietary Reference Intake (recommended amount) for adult males is 38 grams per day. For adult females, it is 25 grams per day. Pregnant and lactating women should consume 28 grams of fiber per day. Fiber is the intact part of a plant that is not broken down during digestion. Functional fiber is fiber that has been isolated from the plant to provide a beneficial  effect in the body. PURPOSE  Increase stool bulk.   Ease and regulate bowel movements.   Lower cholesterol.  INDICATIONS THAT YOU NEED MORE FIBER  Constipation and hemorrhoids.   Uncomplicated diverticulosis (intestine condition) and irritable bowel syndrome.   Weight management.   As a protective measure against hardening of the arteries (atherosclerosis), diabetes, and cancer.   GUIDELINES FOR INCREASING FIBER IN THE DIET  Start adding fiber to the diet slowly. A gradual increase of about 5 more grams (2 slices of whole-wheat  bread, 2 servings of most fruits or vegetables, or 1 bowl of high-fiber cereal) per day is best. Too rapid an increase in fiber may result in constipation, flatulence, and bloating.   Drink enough water and fluids to keep your urine clear or pale yellow. Water, juice, or caffeine-free drinks are recommended. Not drinking enough fluid may cause constipation.   Eat a variety of high-fiber foods rather than one type of fiber.   Try to increase your intake of fiber through using high-fiber foods rather than fiber pills or supplements that contain small amounts of fiber.   The goal is to change the types of food eaten. Do not supplement your present diet with high-fiber foods, but replace foods in your present diet.  INCLUDE A VARIETY OF FIBER SOURCES  Replace refined and processed grains with whole grains, canned fruits with fresh fruits, and incorporate other fiber sources. White rice, white breads, and most bakery goods contain little or no fiber.   Brown whole-grain rice, buckwheat oats, and many fruits and vegetables are all good sources of fiber. These include: broccoli, Brussels sprouts, cabbage, cauliflower, beets, sweet potatoes, white potatoes (skin on), carrots, tomatoes, eggplant, squash, berries, fresh fruits, and dried fruits.   Cereals appear to be the richest source of fiber. Cereal fiber is found in whole grains and bran. Bran is the fiber-rich outer coat of cereal grain, which is largely removed in refining. In whole-grain cereals, the bran remains. In breakfast cereals, the largest amount of fiber is found in those with "bran" in their names. The fiber content is sometimes indicated on the label.   You may need to include additional fruits and vegetables each day.   In baking, for 1 cup white flour, you may use the following substitutions:   1 cup whole-wheat flour minus 2 tablespoons.   1/2 cup white flour plus 1/2 cup whole-wheat flour.   Hemorrhoids Hemorrhoids are dilated  (enlarged) veins around the rectum. Sometimes clots will form in the veins. This makes them swollen and painful. These are called thrombosed hemorrhoids. Causes of hemorrhoids include:  Constipation.   Straining to have a bowel movement.   HEAVY LIFTING HOME CARE INSTRUCTIONS  Eat a well balanced diet and drink 6 to 8 glasses of water every day to avoid constipation. You may also use a bulk laxative.   Avoid straining to have bowel movements.   Keep anal area dry and clean.   Do not use a donut shaped pillow or sit on the toilet for long periods. This increases blood pooling and pain.   Move your bowels when your body has the urge; this will require less straining and will decrease pain and pressure.

## 2014-05-29 NOTE — Op Note (Addendum)
[facility] [address] [place], [address]   COLONOSCOPY PROCEDURE REPORT  PATIENT: Vanessa Anderson, Vanessa Anderson  MR#: [id number] BIRTHDATE: 21-Jan-1946 , 67  yrs. old GENDER: Female ENDOSCOPIST: Barney Drain, MD REFERRED WP:VXYI Hall, M.D. PROCEDURE DATE:  05/28/2014 PROCEDURE:   Colonoscopy, screening INDICATIONS:Average risk patient for colon cancer.  HAD BLADDER SURGERY FOR UTIs 15 YEARS AGO. MAY PASS AIR WITH URINATION. NO RECENT PROBLEMS WITH UTI. MEDICATIONS: Demerol 50 mg IV and Versed 5 mg IV  DESCRIPTION OF PROCEDURE:    Physical exam was performed.  Informed consent was obtained from the patient after explaining the benefits, risks, and alternatives to procedure.  The patient was connected to monitor and placed in left lateral position. Continuous oxygen was provided by nasal cannula and IV medicine administered through an indwelling cannula.  After administration of sedation and rectal exam, the patients rectum was intubated and the EC-3490TLi ([id number]) and EC-3890Li ([id number])  colonoscope was advanced under direct visualization to the ileum.  The scope was removed slowly by carefully examining the color, texture, anatomy, and integrity mucosa on the way out.  The patient was recovered in endoscopy and discharged home in satisfactory condition.    COLON FINDINGS: The mucosa appeared normal in the terminal ileum. FEW CECAL AVMs. The LEFT colon IS SLIGHTLY redundant.  Manual abdominal counter-pressure was used to reach the cecum, The colon mucosa was otherwise normal.  , Small internal hemorrhoids were found.  Defect in rectal wall WITH SURROUNDING CHANGES CONSISTENT WITH CHRONIC PROCESS.  PREP QUALITY: excellent.  CECAL W/D TIME: 12 minutes COMPLICATIONS: None  ENDOSCOPIC IMPRESSION: 1.   Normal mucosa in the terminal ileum 2.   The LEFT colon IS SLIGHTLY redundant 3.   Small internal hemorrhoids 4.   Defect in rectal wall: DIVERTICULUM V. COLOVESICAL  FISTULA.   RECOMMENDATIONS: Next colonoscopy in 10-15 years IF THE BENEFITS OUTWEIGH THE RISKS. FOLLOW A HIGH FIBER DIET.  AVOID ITEMS THAT CAUSE BLOATING.  KUB UPRIGHT/L LAT DECUBITUS SHOWED NO FREE AIR.  CT SCAN OF PELVIS WITH RECTAL CONTRAST NEXT WEEK SURGERY APPT(UROLOGY OR GENERAL SURGERY) IN 2 WEEKS TO EVALUATE THE DEFECT.       _______________________________ Lorrin MaisBarney Drain, MD 05/29/2014 2:06 PM Revised: 05/29/2014 2:06 PM

## 2014-05-31 ENCOUNTER — Encounter (HOSPITAL_COMMUNITY): Payer: Self-pay | Admitting: Gastroenterology

## 2014-05-31 ENCOUNTER — Telehealth: Payer: Self-pay | Admitting: Gastroenterology

## 2014-05-31 ENCOUNTER — Other Ambulatory Visit: Payer: Self-pay | Admitting: Gastroenterology

## 2014-05-31 DIAGNOSIS — N321 Vesicointestinal fistula: Secondary | ICD-10-CM

## 2014-05-31 NOTE — Telephone Encounter (Signed)
CT scan is scheduled for Wednesday July 29th at 1:30 and Ms. Bendickson is aware. CT PELVIS W/O IV CONTRAST PER [facility]

## 2014-05-31 NOTE — Telephone Encounter (Signed)
Message copied by Rodney Langton on Mon May 31, 2014  9:20 AM ------      Message from: Danie Binder      Created: Fri May 28, 2014 10:41 AM       CT SCAN OF YOUR PELVIS WITH RECTAL CONTRAST-evaluate for colovesical fistula  NEXT WEEK             MAKE AN APPT FOR [facility] IN 2 WEEKS TO EVALUATE for colovesical fistula ------

## 2014-06-02 ENCOUNTER — Ambulatory Visit (HOSPITAL_COMMUNITY)
Admission: RE | Admit: 2014-06-02 | Discharge: 2014-06-02 | Disposition: A | Discharge status: Home / Self Care | Payer: Medicare Other | Source: Ambulatory Visit | Attending: Gastroenterology | Admitting: Gastroenterology

## 2014-06-02 DIAGNOSIS — IMO0002 Reserved for concepts with insufficient information to code with codable children: Secondary | ICD-10-CM | POA: Insufficient documentation

## 2014-06-02 DIAGNOSIS — N321 Vesicointestinal fistula: Secondary | ICD-10-CM

## 2015-03-02 DIAGNOSIS — I10 Essential (primary) hypertension: Secondary | ICD-10-CM | POA: Diagnosis not present

## 2015-03-02 DIAGNOSIS — R7301 Impaired fasting glucose: Secondary | ICD-10-CM | POA: Diagnosis not present

## 2015-03-04 DIAGNOSIS — E782 Mixed hyperlipidemia: Secondary | ICD-10-CM | POA: Diagnosis not present

## 2015-03-04 DIAGNOSIS — R7301 Impaired fasting glucose: Secondary | ICD-10-CM | POA: Diagnosis not present

## 2015-03-04 DIAGNOSIS — I10 Essential (primary) hypertension: Secondary | ICD-10-CM | POA: Diagnosis not present

## 2015-04-05 DIAGNOSIS — J309 Allergic rhinitis, unspecified: Secondary | ICD-10-CM | POA: Diagnosis not present

## 2015-07-25 DIAGNOSIS — Z23 Encounter for immunization: Secondary | ICD-10-CM | POA: Diagnosis not present

## 2015-08-12 DIAGNOSIS — R7301 Impaired fasting glucose: Secondary | ICD-10-CM | POA: Diagnosis not present

## 2015-08-12 DIAGNOSIS — E782 Mixed hyperlipidemia: Secondary | ICD-10-CM | POA: Diagnosis not present

## 2015-08-17 DIAGNOSIS — R7301 Impaired fasting glucose: Secondary | ICD-10-CM | POA: Diagnosis not present

## 2015-08-17 DIAGNOSIS — I10 Essential (primary) hypertension: Secondary | ICD-10-CM | POA: Diagnosis not present

## 2015-08-17 DIAGNOSIS — E782 Mixed hyperlipidemia: Secondary | ICD-10-CM | POA: Diagnosis not present

## 2015-08-17 DIAGNOSIS — I482 Chronic atrial fibrillation: Secondary | ICD-10-CM | POA: Diagnosis not present

## 2015-08-23 ENCOUNTER — Other Ambulatory Visit (HOSPITAL_COMMUNITY): Payer: Self-pay | Admitting: Internal Medicine

## 2015-08-23 DIAGNOSIS — Z1239 Encounter for other screening for malignant neoplasm of breast: Secondary | ICD-10-CM

## 2015-08-31 ENCOUNTER — Ambulatory Visit (HOSPITAL_COMMUNITY)
Admission: RE | Admit: 2015-08-31 | Discharge: 2015-08-31 | Disposition: A | Discharge status: Home / Self Care | Payer: Medicare Other | Source: Ambulatory Visit | Attending: Internal Medicine | Admitting: Internal Medicine

## 2015-08-31 DIAGNOSIS — Z1239 Encounter for other screening for malignant neoplasm of breast: Secondary | ICD-10-CM

## 2015-08-31 DIAGNOSIS — Z1231 Encounter for screening mammogram for malignant neoplasm of breast: Secondary | ICD-10-CM | POA: Diagnosis not present

## 2015-09-28 DIAGNOSIS — J00 Acute nasopharyngitis [common cold]: Secondary | ICD-10-CM | POA: Diagnosis not present

## 2015-09-28 DIAGNOSIS — J309 Allergic rhinitis, unspecified: Secondary | ICD-10-CM | POA: Diagnosis not present

## 2015-12-26 DIAGNOSIS — H52223 Regular astigmatism, bilateral: Secondary | ICD-10-CM | POA: Diagnosis not present

## 2015-12-26 DIAGNOSIS — H5203 Hypermetropia, bilateral: Secondary | ICD-10-CM | POA: Diagnosis not present

## 2015-12-26 DIAGNOSIS — H40053 Ocular hypertension, bilateral: Secondary | ICD-10-CM | POA: Diagnosis not present

## 2015-12-26 DIAGNOSIS — H524 Presbyopia: Secondary | ICD-10-CM | POA: Diagnosis not present

## 2016-01-27 DIAGNOSIS — H2512 Age-related nuclear cataract, left eye: Secondary | ICD-10-CM | POA: Diagnosis not present

## 2016-01-27 DIAGNOSIS — H25011 Cortical age-related cataract, right eye: Secondary | ICD-10-CM | POA: Diagnosis not present

## 2016-01-27 DIAGNOSIS — H2511 Age-related nuclear cataract, right eye: Secondary | ICD-10-CM | POA: Diagnosis not present

## 2016-01-27 DIAGNOSIS — H25012 Cortical age-related cataract, left eye: Secondary | ICD-10-CM | POA: Diagnosis not present

## 2016-01-27 DIAGNOSIS — H40003 Preglaucoma, unspecified, bilateral: Secondary | ICD-10-CM | POA: Diagnosis not present

## 2016-02-16 DIAGNOSIS — R7301 Impaired fasting glucose: Secondary | ICD-10-CM | POA: Diagnosis not present

## 2016-02-16 DIAGNOSIS — E782 Mixed hyperlipidemia: Secondary | ICD-10-CM | POA: Diagnosis not present

## 2016-02-21 DIAGNOSIS — E782 Mixed hyperlipidemia: Secondary | ICD-10-CM | POA: Diagnosis not present

## 2016-02-21 DIAGNOSIS — I482 Chronic atrial fibrillation: Secondary | ICD-10-CM | POA: Diagnosis not present

## 2016-02-21 DIAGNOSIS — R7301 Impaired fasting glucose: Secondary | ICD-10-CM | POA: Diagnosis not present

## 2016-02-21 DIAGNOSIS — I10 Essential (primary) hypertension: Secondary | ICD-10-CM | POA: Diagnosis not present

## 2016-03-14 ENCOUNTER — Ambulatory Visit: Payer: Self-pay | Admitting: Cardiology

## 2016-03-14 ENCOUNTER — Ambulatory Visit (INDEPENDENT_AMBULATORY_CARE_PROVIDER_SITE_OTHER): Payer: Medicare Other | Admitting: Cardiology

## 2016-03-14 ENCOUNTER — Encounter: Payer: Self-pay | Admitting: Cardiology

## 2016-03-14 ENCOUNTER — Encounter: Payer: Self-pay | Admitting: *Deleted

## 2016-03-14 VITALS — BP 130/68 | HR 79 | Ht 62.0 in | Wt 185.0 lb

## 2016-03-14 DIAGNOSIS — I4891 Unspecified atrial fibrillation: Secondary | ICD-10-CM

## 2016-03-14 DIAGNOSIS — I10 Essential (primary) hypertension: Secondary | ICD-10-CM | POA: Diagnosis not present

## 2016-03-14 DIAGNOSIS — I480 Paroxysmal atrial fibrillation: Secondary | ICD-10-CM

## 2016-03-14 NOTE — Progress Notes (Signed)
Cardiology Office Note  Date: 03/14/2016   ID: Vanessa, Anderson [birth year], MRN [id number]  PCP: Wende Neighbors, MD  Primary Cardiologist: Rozann Lesches, MD   Chief Complaint  Patient presents with  . History of atrial fibrillation    History of Present Illness: Vanessa Anderson is a 70 y.o. female last seen in March 2015. She has a prior history of atrial fibrillation documented in the setting of influenza back in January 2015, no obvious recurrences. She was seen by Dr. [name] recently in April, heart rate reported as irregular, no ECG obtained.  She comes in today for a follow-up visit. She does not report any regular sense of palpitations, unusual breathlessness, or chest pain. She states that a Doctor, general practice checked on her at home and noticed an irregular heartbeat about 6 weeks ago.  ECG done in the office today showed normal sinus rhythm with increased voltage and nonspecific ST changes.  CHADSVASC score is 3. Annual risk of stroke on aspirin is approximately 3.4% versus 1.1% on an agent such as Eliquis.  Past Medical History  Diagnosis Date  . Essential hypertension, benign   . Fibromyalgia   . Atrial fibrillation ([place])     In the setting of influenza    Past Surgical History  Procedure Laterality Date  . Cesarean section    . Colonoscopy N/A 05/28/2014    Procedure: COLONOSCOPY;  Surgeon: Danie Binder, MD;  [facility];  Service: Endoscopy;  Laterality: N/A;  8:30 AM    Current Outpatient Prescriptions  Medication Sig Dispense Refill  . aspirin 81 MG tablet Take 81 mg by mouth daily.     . hydrochlorothiazide (HYDRODIURIL) 25 MG tablet Take 1 tablet (25 mg total) by mouth daily. 90 tablet 3   No current facility-administered medications for this visit.   Allergies:  Review of patient's allergies indicates no known allergies.   Social History: The patient  reports that she has never smoked. She has never used smokeless tobacco. She  reports that she does not drink alcohol or use illicit drugs.   ROS:  Please see the history of present illness. Otherwise, complete review of systems is positive for fibromyalgia pains.  All other systems are reviewed and negative.   Physical Exam: VS:  BP 130/68 mmHg  Pulse 79  Ht 5\' 2"  (1.575 m)  Wt 185 lb (83.915 kg)  BMI 33.83 kg/m2  SpO2 98%, BMI Body mass index is 33.83 kg/(m^2).  Wt Readings from Last 3 Encounters:  03/14/16 185 lb (83.915 kg)  01/29/14 171 lb (77.565 kg)  12/18/13 173 lb (78.472 kg)    Patient appears comfortable at rest. HEENT: Conjunctiva and lids normal, oropharynx clear. Neck: Supple, no elevated JVP or carotid bruits, no thyromegaly. Lungs: Clear to auscultation, nonlabored breathing at rest. Cardiac: Regular rate and rhythm, no S3 or significant systolic murmur, no pericardial rub. Abdomen: Soft, nontender, bowel sounds present. Extremities: No pitting edema, distal pulses 2+.  ECG: I personally reviewed the prior tracing from 11/26/2013 which showed sinus rhythm with subsequent burst of atrial fibrillation/flutter at 149 bpm, nonspecific ST changes.  Other Studies Reviewed Today:  Echocardiogram 11/27/2013: Study Conclusions  - Left ventricle: The cavity size was normal. Wall thickness was normal. Systolic function was normal. The estimated ejection fraction was in the range of 60% to 65%. Wall motion was normal; there were no regional wall motion abnormalities. Features are consistent with a pseudonormal left ventricular filling pattern, with concomitant  abnormal relaxation and increased filling pressure (grade 2 diastolic dysfunction). - Aortic valve: No significant regurgitation. Mean gradient: 52mm Hg (S). Peak gradient: 46mm Hg (S). - Mitral valve: Mildly thickened leaflets . Trivial regurgitation. - Left atrium: The atrium was mildly dilated. - Right atrium: Central venous pressure: 22mm Hg (est). - Atrial septum: No  defect or patent foramen ovale was identified. - Tricuspid valve: Trivial regurgitation. - Pulmonary arteries: PA peak pressure: 60mm Hg (S). - Pericardium, extracardiac: A trivial pericardial effusion was identified posterior to the heart. Impressions:  - Normal LV wall thickness with LVEF 123456, grade 2 diastolic dysfunction. Mild left atrial enlargement. Mildly thickened mitral valve with trivial mitral regurgitation. Normal PASP 25 mmHg. Trivial posterior pericardial effusion.  Assessment and Plan:  1. History of atrial fibrillation back in 2005 in the setting of influenza. Reportedly she has had documentation of irregular heartbeat within the last 6 weeks, but is not aware of any palpitations, and is in sinus rhythm by ECG today. I have recommended a two-week cardiac monitor to investigate any potential PAF that may make [place] consider switching from aspirin to DOAC.  2. Essential hypertension, blood pressure is adequately controlled today on HCTZ.  Current medicines were reviewed with the patient today.   Orders Placed This Encounter  Procedures  . Cardiac event monitor  . EKG 12-Lead    Disposition: FU with me in 6 months.   Signed, Satira Sark, MD, [facility] 03/14/2016 2:43 PM    [place] [facility] [address], [place], [place] [address] Phone: [phone]; Fax: [phone]

## 2016-03-14 NOTE — Patient Instructions (Addendum)
Medication Instructions:  Your physician recommends that you continue on your current medications as directed. Please refer to the Current Medication list given to you today.   Labwork: NONE  Testing/Procedures: Your physician has recommended that you wear an event monitor. Event monitors are medical devices that record the heart's electrical activity. Doctors most often [place] these monitors to diagnose arrhythmias. Arrhythmias are problems with the speed or rhythm of the heartbeat. The monitor is a small, portable device. You can wear one while you do your normal daily activities. This is usually used to diagnose what is causing palpitations/syncope (passing out).  ** 14 days**   Follow-Up: Your physician wants you to follow-up in: 6 MONTHS.  You will receive a reminder letter in the mail two months in advance. If you don't receive a letter, please call our office to schedule the follow-up appointment.    Any Other Special Instructions Will Be Listed Below (If Applicable).     If you need a refill on your cardiac medications before your next appointment, please call your pharmacy.

## 2016-03-16 ENCOUNTER — Ambulatory Visit (INDEPENDENT_AMBULATORY_CARE_PROVIDER_SITE_OTHER): Payer: Medicare Other

## 2016-03-16 DIAGNOSIS — I480 Paroxysmal atrial fibrillation: Secondary | ICD-10-CM

## 2016-03-22 ENCOUNTER — Telehealth: Payer: Self-pay

## 2016-03-22 ENCOUNTER — Other Ambulatory Visit (HOSPITAL_COMMUNITY)
Admission: RE | Admit: 2016-03-22 | Discharge: 2016-03-22 | Disposition: A | Discharge status: Home / Self Care | Payer: Medicare Other | Source: Ambulatory Visit | Attending: Cardiovascular Disease | Admitting: Cardiovascular Disease

## 2016-03-22 DIAGNOSIS — I480 Paroxysmal atrial fibrillation: Secondary | ICD-10-CM | POA: Diagnosis not present

## 2016-03-22 LAB — CBC
HEMATOCRIT: 42.2 % (ref 36.0–46.0)
Hemoglobin: 13.7 g/dL (ref 12.0–15.0)
MCH: 28.7 pg (ref 26.0–34.0)
MCHC: 32.5 g/dL (ref 30.0–36.0)
MCV: 88.5 fL (ref 78.0–100.0)
PLATELETS: 245 10*3/uL (ref 150–400)
RBC: 4.77 MIL/uL (ref 3.87–5.11)
RDW: 14.2 % (ref 11.5–15.5)
WBC: 12.5 10*3/uL — ABNORMAL HIGH (ref 4.0–10.5)

## 2016-03-22 LAB — BASIC METABOLIC PANEL
Anion gap: 8 (ref 5–15)
BUN: 22 mg/dL — AB (ref 6–20)
CO2: 26 mmol/L (ref 22–32)
Calcium: 9.4 mg/dL (ref 8.9–10.3)
Chloride: 104 mmol/L (ref 101–111)
Creatinine, Ser: 1.11 mg/dL — ABNORMAL HIGH (ref 0.44–1.00)
GFR calc non Af Amer: 49 mL/min — ABNORMAL LOW (ref >60)
GFR, EST AFRICAN AMERICAN: 57 mL/min — AB (ref >60)
Glucose, Bld: 153 mg/dL — ABNORMAL HIGH (ref 65–99)
Potassium: 3.8 mmol/L (ref 3.5–5.1)
Sodium: 138 mmol/L (ref 135–145)

## 2016-03-22 MED ORDER — DILTIAZEM HCL ER COATED BEADS 120 MG PO CP24: 120.0000 mg | ORAL_CAPSULE | Freq: Every day | ORAL | Status: DC

## 2016-03-22 MED ORDER — APIXABAN 5 MG PO TABS: 5.0000 mg | ORAL_TABLET | Freq: Two times a day (BID) | ORAL | Status: DC

## 2016-03-22 NOTE — Telephone Encounter (Signed)
Received another critical call form [employer] with auto detect Afib with SVT (sec), RVR,HR 250 sustained for 2 minutes this time.Patient states she was sitting in chair both times, no c/o dizzinees,sob,or CP.messaged dr Louanna Raw egarding this episode.Placed report in reading room also.   I spoke with Dr Bronson Ing, STOP aspirin, STOP HCTZ   GET CBC,BMET NOW  START ELIQUIS 5 mg twice a day,first dose tonight.  START Diltiazem 120 mg daily   Follow up on Monday 03/26/16 at 3:10 pm with Arnold Long NP

## 2016-03-22 NOTE — Telephone Encounter (Signed)
Pt of dr Dannielle Huh called with auto detect afib with 1 minute RVR, had also noted SR with PAC's.Per Dr Piedad Climes in reading room so he can review.

## 2016-03-22 NOTE — Telephone Encounter (Signed)
Called by Mertha Finders as cardiac monitoring company called regarding rapid atrial fibrillation. Patient is entirely asymptomatic. I have personally reviewed rhythm strips documenting rapid atrial fibrillation, HR 170 bpm.  I will stop HCTZ and start long-acting diltiazem 120 mg daily. Given elevated CHA2DS2Vasc score, I will stop ASA and start Eliquis 5 mg BID. Will need CBC and BMET as well.  I have arranged for follow up with Arnold Long NP on 03/26/16.

## 2016-03-26 ENCOUNTER — Ambulatory Visit (INDEPENDENT_AMBULATORY_CARE_PROVIDER_SITE_OTHER): Payer: Medicare Other | Admitting: Adult Health

## 2016-03-26 ENCOUNTER — Encounter: Payer: Self-pay | Admitting: Adult Health

## 2016-03-26 VITALS — BP 140/60 | HR 57 | Ht 62.0 in | Wt 187.0 lb

## 2016-03-26 DIAGNOSIS — I4891 Unspecified atrial fibrillation: Secondary | ICD-10-CM

## 2016-03-26 NOTE — Progress Notes (Deleted)
Name: Vanessa Anderson    DOB: 1946/10/07  Age: 70 y.o.  MR#: [id number]       PCP:  Wende Neighbors, MD      Insurance: Payor: [employer] MEDICARE / Plan: [facility] MEDICARE / Product Type: *No Product type* /   CC:   No chief complaint on file.   VS Filed Vitals:   03/26/16 1500  BP: 140/60  Pulse: 57  Height: 5\' 2"  (1.575 m)  Weight: 187 lb (84.823 kg)  SpO2: 97%    Weights Current Weight  03/26/16 187 lb (84.823 kg)  03/14/16 185 lb (83.915 kg)  01/29/14 171 lb (77.565 kg)    Blood Pressure  BP Readings from Last 3 Encounters:  03/26/16 140/60  03/14/16 130/68  05/28/14 108/55     Admit date:  (Not on file) Last encounter with RMR:  Visit date not found   Allergy Review of patient's allergies indicates no known allergies.  Current Outpatient Prescriptions  Medication Sig Dispense Refill  . apixaban (ELIQUIS) 5 MG TABS tablet Take 1 tablet (5 mg total) by mouth 2 (two) times daily. 60 tablet 6  . diltiazem (CARDIZEM CD) 120 MG 24 hr capsule Take 1 capsule (120 mg total) by mouth daily. 60 capsule 3   No current facility-administered medications for this visit.    Discontinued Meds:   There are no discontinued medications.  Patient Active Problem List   Diagnosis Date Noted  . Influenza A 11/26/2013  . Cough 11/26/2013  . PAF (paroxysmal atrial fibrillation) ([place]) 11/26/2013  . Essential hypertension, benign     LABS    Component Value Date/Time   NA 138 03/22/2016 1551   NA 140 11/26/2013 1709   NA 136 09/12/2013 1005   K 3.8 03/22/2016 1551   K 4.4 11/26/2013 1709   K 3.5 09/12/2013 1005   CL 104 03/22/2016 1551   CL 100 11/26/2013 1709   CL 102 09/12/2013 1005   CO2 26 03/22/2016 1551   CO2 27 11/26/2013 1709   CO2 25 09/12/2013 1005   GLUCOSE 153* 03/22/2016 1551   GLUCOSE 97 11/26/2013 1709   GLUCOSE 98 09/12/2013 1005   BUN 22* 03/22/2016 1551   BUN 17 11/26/2013 1709   BUN 18 09/12/2013 1005   CREATININE 1.11* 03/22/2016 1551   CREATININE  0.99 11/26/2013 1709   CREATININE 0.98 09/12/2013 1005   CALCIUM 9.4 03/22/2016 1551   CALCIUM 9.7 11/26/2013 1709   CALCIUM 9.4 09/12/2013 1005   GFRNONAA 49* 03/22/2016 1551   GFRNONAA 57* 11/26/2013 1709   GFRNONAA 58* 09/12/2013 1005   GFRAA 57* 03/22/2016 1551   GFRAA 66* 11/26/2013 1709   GFRAA 68* 09/12/2013 1005   CMP     Component Value Date/Time   NA 138 03/22/2016 1551   K 3.8 03/22/2016 1551   CL 104 03/22/2016 1551   CO2 26 03/22/2016 1551   GLUCOSE 153* 03/22/2016 1551   BUN 22* 03/22/2016 1551   CREATININE 1.11* 03/22/2016 1551   CALCIUM 9.4 03/22/2016 1551   PROT 8.1 11/26/2013 1709   ALBUMIN 4.3 11/26/2013 1709   AST 15 11/26/2013 1709   ALT 11 11/26/2013 1709   ALKPHOS 135* 11/26/2013 1709   BILITOT 0.4 11/26/2013 1709   GFRNONAA 49* 03/22/2016 1551   GFRAA 57* 03/22/2016 1551       Component Value Date/Time   WBC 12.5* 03/22/2016 1551   WBC 7.8 11/26/2013 1705   WBC 7.1 09/12/2013 1005   HGB 13.7 03/22/2016  1551   HGB 13.4 11/26/2013 1705   HGB 12.4 09/12/2013 1005   HCT 42.2 03/22/2016 1551   HCT 41.7 11/26/2013 1705   HCT 38.5 09/12/2013 1005   MCV 88.5 03/22/2016 1551   MCV 89.5 11/26/2013 1705   MCV 88.7 09/12/2013 1005    Lipid Panel  No results found for: CHOL, TRIG, HDL, CHOLHDL, VLDL, LDLCALC, LDLDIRECT  ABG No results found for: PHART, PCO2ART, PO2ART, HCO3, TCO2, ACIDBASEDEF, O2SAT   Lab Results  Component Value Date   TSH 3.534 11/26/2013   BNP (last 3 results) No results for input(s): BNP in the last 8760 hours.  ProBNP (last 3 results) No results for input(s): PROBNP in the last 8760 hours.  Cardiac Panel (last 3 results) No results for input(s): CKTOTAL, CKMB, TROPONINI, RELINDX in the last 72 hours.  Iron/TIBC/Ferritin/ %Sat No results found for: IRON, TIBC, FERRITIN, IRONPCTSAT   EKG Orders placed or performed in visit on 03/26/16  . EKG 12-Lead     Prior Assessment and Plan Problem List as of 03/26/2016       Cardiovascular and Mediastinum   Essential hypertension, benign   Last Assessment & Plan 01/29/2014 Office Visit Written 01/29/2014  9:42 AM by Satira Sark, MD    Cough has resolved off ACE inhibitor, she is tolerating Norvasc. Would recommend follow with Dr. [name] at this point for medication adjustments and refills.      PAF (paroxysmal atrial fibrillation) [facility])   Last Assessment & Plan 01/29/2014 Office Visit Written 01/29/2014  9:41 AM by Satira Sark, MD    Single episode noted with influenza A back in January. No recurrence. Would recommend observation for now. She will stay on aspirin at this point. Keep follow with Dr. [name], we can see her back if she has recurring arrhythmias.        Respiratory   Influenza A   Last Assessment & Plan 12/18/2013 Office Visit Written 12/18/2013  3:08 PM by Lendon Colonel, NP    She has recovered well. She denies any flulike symptoms. She will follow with her PCP.        Other   Cough       Imaging: No results found.

## 2016-03-26 NOTE — Patient Instructions (Addendum)
Medication Instructions:  Your physician recommends that you continue on your current medications as directed. Please refer to the Current Medication list given to you today.   Labwork: none  Testing/Procedures: Your physician has requested that you have an echocardiogram. Echocardiography is a painless test that uses sound waves to create images of your heart. It provides your doctor with information about the size and shape of your heart and how well your heart's chambers and valves are working. This procedure takes approximately one hour. There are no restrictions for this procedure.    Follow-Up: Your physician recommends that you schedule a follow-up appointment in: 1 month with Dr. [name]    Any Other Special Instructions Will Be Listed Below (If Applicable).     If you need a refill on your cardiac medications before your next appointment, please call your pharmacy.

## 2016-03-26 NOTE — Progress Notes (Signed)
Cardiology Office Note   Date:  03/26/2016   ID:  Vanessa, Anderson May 20, 1946, MRN [id number]  PCP:  Wende Neighbors, MD  Cardiologist:[place]/ Jory Sims, NP   Chief Complaint  Patient presents with  . Atrial Fibrillation      History of Present Illness: Vanessa Anderson is a 70 y.o. female who presents for ongoing assessment and management of atrial fibrillation, hypertension, with other history to include fibromyalgia and chronic pain. He was last seen in the office by Dr. [name] on 03/14/2016 with complaints of irregular heart rhythm which was intermittent. In the office visit she was in sinus rhythm, but a two-week cardiac monitor to investigate any potential PAF was ordered. Consideration for switching from aspirin to DOAC.  On 03/22/2016, cardiac monitoring revealed rapid atrial fibrillation, heart rate 170 beats per minute, this was reported to Dr. Bronson Ing who was attending that day. It was his recommendation that she begin long-acting diltiazem 120 mg daily, stopped HCTZ, and begin ELIQUIS 5 mg twice a day. Followup CBC and BMET is planned.  BMET dated 03/22/2016: Sodium 138, potassium 3.8, chloride 104, CO2 26, BUN 22, creatinine 1.11. CBC dated 03/22/2016: Blood cells 12.5, hemoglobin 13.7, hematocrit 42.2, platelets 245,000.  She appears to be tolerating diltiazem and ELIQUIS without complaints of dizziness, bleeding, or discomfort. She states that last evening she did feel "funny" in her chest but this did not last long. She states she was seated watching a neighbor mow his lawn last week when her heart rate was elevated. Her only symptom was "feeling funny". She remains on cardiac monitor which will be taken off in 4 days.   Past Medical History  Diagnosis Date  . Essential hypertension, benign   . Fibromyalgia   . Atrial fibrillation ([place])     In the setting of influenza    Past Surgical History  Procedure Laterality Date  . Cesarean section    .  Colonoscopy N/A 05/28/2014    Procedure: COLONOSCOPY;  Surgeon: Danie Binder, MD;  Location: [facility];  Service: Endoscopy;  Laterality: N/A;  8:30 AM     Current Outpatient Prescriptions  Medication Sig Dispense Refill  . apixaban (ELIQUIS) 5 MG TABS tablet Take 1 tablet (5 mg total) by mouth 2 (two) times daily. 60 tablet 6  . diltiazem (CARDIZEM CD) 120 MG 24 hr capsule Take 1 capsule (120 mg total) by mouth daily. 60 capsule 3   No current facility-administered medications for this visit.    Allergies:   Review of patient's allergies indicates no known allergies.    Social History:  The patient  reports that she has never smoked. She has never used smokeless tobacco. She reports that she does not drink alcohol or use illicit drugs.   Family History:  The patient's family history is not on file.    ROS: All other systems are reviewed and negative. Unless otherwise mentioned in H&P    PHYSICAL EXAM: VS:  BP 140/60 mmHg  Pulse 57  Ht 5\' 2"  (1.575 m)  Wt 187 lb (84.823 kg)  BMI 34.19 kg/m2  SpO2 97% , BMI Body mass index is 34.19 kg/(m^2). GEN: Well nourished, well developed, in no acute distress HEENT: normal Neck: no JVD, carotid bruits, or masses Cardiac: RRR; occasional extra systole. no murmurs, rubs, or gallops,no edema  Respiratory:  clear to auscultation bilaterally, normal work of breathing GI: soft, nontender, nondistended, + BS MS: no deformity or atrophy Skin: warm and dry, no rash  Neuro:  Strength and sensation are intact Psych: euthymic mood, full affect.   Recent Labs: 03/22/2016: BUN 22*; Creatinine, Ser 1.11*; Hemoglobin 13.7; Platelets 245; Potassium 3.8; Sodium 138    Lipid Panel No results found for: CHOL, TRIG, HDL, CHOLHDL, VLDL, LDLCALC, LDLDIRECT    Wt Readings from Last 3 Encounters:  03/26/16 187 lb (84.823 kg)  03/14/16 185 lb (83.915 kg)  01/29/14 171 lb (77.565 kg)      Other studies Reviewed: Additional studies/ records that  were reviewed today include: echocardiogram from 2015. Review of the above records demonstrates: 11/27/2013 revealing estimated EF of 60-65%. Wall motion normal. Grade 2 diastolic dysfunction. Left atrium was mildly dilated. Trivial tricuspid valve regurg. Trivial pericardial effusion.    ASSESSMENT AND PLAN:  1. Newly diagnosed paroxysmal atrial fibrillation: this was noted on cardiac monitor that was placed on the patient by Dr. [name]. Patient's heart rate increased to 170 beats per minute, and had recurrence of 240 beats per minute per monitor. She was therefore placed on diltiazem 120 mg daily, and started on ELIQUIS 5 mg twice a day.  She is tolerating the medication without recurrence of symptoms. Her only symptom is "funny feeling" she denied any chest pain dyspnea or dizziness associated with elevated heart rate last week. She states that she felt a little funny last evening. She has not complained of any bleeding or bruising.  I will repeat her echocardiogram as one has not been completed since 2015 to evaluate for changes in heart function and structure. She will followup with Dr. [name] to discuss results and need for further testing.   Current medicines are reviewed at length with the patient today.    Labs/ tests ordered today include: echocardiogram  Orders Placed This Encounter  Procedures  . ECHOCARDIOGRAM COMPLETE     Disposition:   FU with Dr [name] in one month. Signed, Jory Sims, NP  03/26/2016 3:37 PM    [facility] [address], [place], [place] [address] Phone: [phone]; Fax: [phone]

## 2016-03-29 ENCOUNTER — Ambulatory Visit (HOSPITAL_COMMUNITY)
Admission: RE | Admit: 2016-03-29 | Discharge: 2016-03-29 | Disposition: A | Discharge status: Home / Self Care | Payer: Medicare Other | Source: Ambulatory Visit | Attending: Adult Health | Admitting: Adult Health

## 2016-03-29 DIAGNOSIS — I10 Essential (primary) hypertension: Secondary | ICD-10-CM | POA: Diagnosis not present

## 2016-03-29 DIAGNOSIS — I071 Rheumatic tricuspid insufficiency: Secondary | ICD-10-CM | POA: Insufficient documentation

## 2016-03-29 DIAGNOSIS — I340 Nonrheumatic mitral (valve) insufficiency: Secondary | ICD-10-CM | POA: Insufficient documentation

## 2016-03-29 DIAGNOSIS — I313 Pericardial effusion (noninflammatory): Secondary | ICD-10-CM | POA: Diagnosis not present

## 2016-03-29 DIAGNOSIS — I4891 Unspecified atrial fibrillation: Secondary | ICD-10-CM | POA: Diagnosis not present

## 2016-03-29 NOTE — Progress Notes (Signed)
*  PRELIMINARY RESULTS* Echocardiogram 2D Echocardiogram has been performed.  Leavy Cella 03/29/2016, 2:36 PM

## 2016-04-05 ENCOUNTER — Ambulatory Visit: Payer: Self-pay | Admitting: Cardiology

## 2016-05-09 ENCOUNTER — Ambulatory Visit: Payer: Self-pay | Admitting: Cardiology

## 2016-05-15 ENCOUNTER — Encounter: Payer: Self-pay | Admitting: Cardiology

## 2016-05-15 ENCOUNTER — Ambulatory Visit (INDEPENDENT_AMBULATORY_CARE_PROVIDER_SITE_OTHER): Payer: Medicare Other | Admitting: Cardiology

## 2016-05-15 VITALS — BP 142/60 | HR 79 | Ht 62.0 in | Wt 185.0 lb

## 2016-05-15 DIAGNOSIS — I480 Paroxysmal atrial fibrillation: Secondary | ICD-10-CM | POA: Diagnosis not present

## 2016-05-15 DIAGNOSIS — I10 Essential (primary) hypertension: Secondary | ICD-10-CM | POA: Diagnosis not present

## 2016-05-15 NOTE — Patient Instructions (Signed)
Your physician wants you to follow-up in: 4 months with Dr McDowell You will receive a reminder letter in the mail two months in advance. If you don't receive a letter, please call our office to schedule the follow-up appointment.    Your physician recommends that you continue on your current medications as directed. Please refer to the Current Medication list given to you today.    If you need a refill on your cardiac medications before your next appointment, please call your pharmacy.     Thank you for choosing [facility] !        

## 2016-05-15 NOTE — Progress Notes (Signed)
Cardiology Office Note  Date: 05/15/2016   ID: Vanessa, Anderson [birth year], MRN [id number]  PCP: Wende Neighbors, MD  Primary Cardiologist: Rozann Lesches, MD   Chief Complaint  Patient presents with  . History of atrial fibrillation    History of Present Illness: Vanessa Anderson is a 70 y.o. female last seen by [name] NP in May. Since I last saw her, cardiac monitor did identify episodes of rapid PAF, and she was placed on Eliquis instead of aspirin for stroke prophylaxis with CHADSVASC score of 3. She was also placed on Cardizem CD. She returns today with a family member reporting significant improvement in palpitations, now only very infrequent and brief. She does not report any bleeding episodes on Eliquis.  I reviewed the results of her echocardiogram from May, outlined below. She also had lab work in May which I reviewed.  Past Medical History  Diagnosis Date  . Essential hypertension, benign   . Fibromyalgia   . Atrial fibrillation ([place])     In the setting of influenza    Current Outpatient Prescriptions  Medication Sig Dispense Refill  . apixaban (ELIQUIS) 5 MG TABS tablet Take 1 tablet (5 mg total) by mouth 2 (two) times daily. 60 tablet 6  . diltiazem (CARDIZEM CD) 120 MG 24 hr capsule Take 1 capsule (120 mg total) by mouth daily. 60 capsule 3   No current facility-administered medications for this visit.   Allergies:  Review of patient's allergies indicates no known allergies.   Social History: The patient  reports that she has never smoked. She has never used smokeless tobacco. She reports that she does not drink alcohol or use illicit drugs.   ROS:  Please see the history of present illness. Otherwise, complete review of systems is positive for occasional brief palpitations.  All other systems are reviewed and negative.   Physical Exam: VS:  BP 142/60 mmHg  Pulse 79  Ht 5\' 2"  (1.575 m)  Wt 185 lb (83.915 kg)  BMI 33.83 kg/m2  SpO2 96%, BMI Body mass  index is 33.83 kg/(m^2).  Wt Readings from Last 3 Encounters:  05/15/16 185 lb (83.915 kg)  03/26/16 187 lb (84.823 kg)  03/14/16 185 lb (83.915 kg)    Patient appears comfortable at rest. HEENT: Conjunctiva and lids normal, oropharynx clear. Neck: Supple, no elevated JVP or carotid bruits, no thyromegaly. Lungs: Clear to auscultation, nonlabored breathing at rest. Cardiac: Regular rate and rhythm, no S3 or significant systolic murmur, no pericardial rub. Abdomen: Soft, nontender, bowel sounds present. Extremities: No pitting edema, distal pulses 2+.  ECG: I personally reviewed the tracing from 03/14/2016 which showed normal sinus rhythm with increased voltage and repolarization abnormalities.  Recent Labwork: 03/22/2016: BUN 22*; Creatinine, Ser 1.11*; Hemoglobin 13.7; Platelets 245; Potassium 3.8; Sodium 138   Other Studies Reviewed Today:  Echocardiogram 03/29/2016: Study Conclusions  - Left ventricle: The cavity size was normal. Wall thickness was  increased in a pattern of mild LVH. Systolic function was normal.  The estimated ejection fraction was in the range of 55% to 60%.  Wall motion was normal; there were no regional wall motion  abnormalities. Left ventricular diastolic function parameters  were normal for the patient&'s age. - Aortic valve: Mildly calcified annulus. Trileaflet. - Mitral valve: There was mild regurgitation. - Left atrium: The atrium was mildly dilated. - Right atrium: Central venous pressure (est): 3 mm Hg. - Tricuspid valve: There was trivial regurgitation. - Pulmonary arteries: Systolic pressure could  not be accurately  estimated. - Pericardium, extracardiac: A small pericardial effusion was  identified posterior to the heart.  Impressions:  - Mild LVH with LVEF 55-60% and grossly normal diastolic function.  Mild left atrial enlargement. Mild mitral regurgitation. Mildly  sclerotic aortic annulus. Trivial tricuspid regurgitation.  Small  posterior pericardial effusion.  Assessment and Plan:  1. Paroxysmal atrial fibrillation, symptomatically improved on current regimen which includes Cardizem CD as well as Eliquis for stroke prophylaxis. We discussed the situation today, no changes were made. She is comfortable with the present regimen. She has had some mild leg swelling on the Cardizem CD but states that this is not bothering her.  2. Essential hypertension, now on Cardizem CD. HCTZ was discontinued. She will keep follow-up with Dr. [name].  Current medicines were reviewed with the patient today.  Disposition: Follow-up with me in 4 months.   Signed, Satira Sark, MD, [facility] 05/15/2016 1:56 PM    [place] [address], [place], [place] [address] Phone: [phone]; Fax: [phone]

## 2016-05-30 ENCOUNTER — Other Ambulatory Visit: Payer: Self-pay

## 2016-05-30 MED ORDER — APIXABAN 5 MG PO TABS: 5.0000 mg | ORAL_TABLET | Freq: Two times a day (BID) | ORAL | 6 refills | Status: DC

## 2016-05-30 MED ORDER — DILTIAZEM HCL ER COATED BEADS 120 MG PO CP24: 120.0000 mg | ORAL_CAPSULE | Freq: Every day | ORAL | 6 refills | Status: DC

## 2016-05-31 ENCOUNTER — Telehealth: Payer: Self-pay

## 2016-05-31 MED ORDER — DILTIAZEM HCL ER COATED BEADS 120 MG PO CP24: 120.0000 mg | ORAL_CAPSULE | Freq: Every day | ORAL | 3 refills | Status: DC

## 2016-05-31 MED ORDER — APIXABAN 5 MG PO TABS: 5.0000 mg | ORAL_TABLET | Freq: Two times a day (BID) | ORAL | 3 refills | Status: DC

## 2016-05-31 NOTE — Telephone Encounter (Signed)
Sent in rx to [facility] for eliquis and cartia.

## 2016-06-08 DIAGNOSIS — M545 Low back pain: Secondary | ICD-10-CM | POA: Diagnosis not present

## 2016-07-06 DIAGNOSIS — R300 Dysuria: Secondary | ICD-10-CM | POA: Diagnosis not present

## 2016-07-06 DIAGNOSIS — I482 Chronic atrial fibrillation: Secondary | ICD-10-CM | POA: Diagnosis not present

## 2016-07-06 DIAGNOSIS — I10 Essential (primary) hypertension: Secondary | ICD-10-CM | POA: Diagnosis not present

## 2016-07-06 DIAGNOSIS — N390 Urinary tract infection, site not specified: Secondary | ICD-10-CM | POA: Diagnosis not present

## 2016-07-27 DIAGNOSIS — Z23 Encounter for immunization: Secondary | ICD-10-CM | POA: Diagnosis not present

## 2016-08-03 DIAGNOSIS — I10 Essential (primary) hypertension: Secondary | ICD-10-CM | POA: Diagnosis not present

## 2016-08-03 DIAGNOSIS — I482 Chronic atrial fibrillation: Secondary | ICD-10-CM | POA: Diagnosis not present

## 2016-08-15 DIAGNOSIS — E782 Mixed hyperlipidemia: Secondary | ICD-10-CM | POA: Diagnosis not present

## 2016-08-15 DIAGNOSIS — I10 Essential (primary) hypertension: Secondary | ICD-10-CM | POA: Diagnosis not present

## 2016-08-15 DIAGNOSIS — R7301 Impaired fasting glucose: Secondary | ICD-10-CM | POA: Diagnosis not present

## 2016-08-24 DIAGNOSIS — E782 Mixed hyperlipidemia: Secondary | ICD-10-CM | POA: Diagnosis not present

## 2016-08-24 DIAGNOSIS — I482 Chronic atrial fibrillation: Secondary | ICD-10-CM | POA: Diagnosis not present

## 2016-08-24 DIAGNOSIS — I10 Essential (primary) hypertension: Secondary | ICD-10-CM | POA: Diagnosis not present

## 2016-08-24 DIAGNOSIS — R7303 Prediabetes: Secondary | ICD-10-CM | POA: Diagnosis not present

## 2016-09-13 ENCOUNTER — Emergency Department (HOSPITAL_COMMUNITY): Payer: Medicare Other

## 2016-09-13 ENCOUNTER — Emergency Department (HOSPITAL_COMMUNITY)
Admission: EM | Admit: 2016-09-13 | Discharge: 2016-09-13 | Disposition: A | Discharge status: Home / Self Care | Payer: Medicare Other | Attending: Emergency Medicine | Admitting: Emergency Medicine

## 2016-09-13 ENCOUNTER — Encounter (HOSPITAL_COMMUNITY): Payer: Self-pay | Admitting: Emergency Medicine

## 2016-09-13 DIAGNOSIS — Z79899 Other long term (current) drug therapy: Secondary | ICD-10-CM | POA: Insufficient documentation

## 2016-09-13 DIAGNOSIS — R1030 Lower abdominal pain, unspecified: Secondary | ICD-10-CM | POA: Diagnosis present

## 2016-09-13 DIAGNOSIS — R938 Abnormal findings on diagnostic imaging of other specified body structures: Secondary | ICD-10-CM | POA: Diagnosis not present

## 2016-09-13 DIAGNOSIS — I10 Essential (primary) hypertension: Secondary | ICD-10-CM | POA: Insufficient documentation

## 2016-09-13 DIAGNOSIS — I480 Paroxysmal atrial fibrillation: Secondary | ICD-10-CM

## 2016-09-13 DIAGNOSIS — N939 Abnormal uterine and vaginal bleeding, unspecified: Secondary | ICD-10-CM | POA: Insufficient documentation

## 2016-09-13 DIAGNOSIS — R935 Abnormal findings on diagnostic imaging of other abdominal regions, including retroperitoneum: Secondary | ICD-10-CM | POA: Insufficient documentation

## 2016-09-13 DIAGNOSIS — R109 Unspecified abdominal pain: Secondary | ICD-10-CM | POA: Diagnosis not present

## 2016-09-13 DIAGNOSIS — R9389 Abnormal findings on diagnostic imaging of other specified body structures: Secondary | ICD-10-CM

## 2016-09-13 LAB — CBC WITH DIFFERENTIAL/PLATELET
BASOS ABS: 0 10*3/uL (ref 0.0–0.1)
Basophils Relative: 0 %
EOS PCT: 1 %
Eosinophils Absolute: 0.1 10*3/uL (ref 0.0–0.7)
HCT: 43.5 % (ref 36.0–46.0)
Hemoglobin: 14.1 g/dL (ref 12.0–15.0)
LYMPHS PCT: 18 %
Lymphs Abs: 1.9 10*3/uL (ref 0.7–4.0)
MCH: 27.9 pg (ref 26.0–34.0)
MCHC: 32.4 g/dL (ref 30.0–36.0)
MCV: 86 fL (ref 78.0–100.0)
Monocytes Absolute: 0.7 10*3/uL (ref 0.1–1.0)
Monocytes Relative: 7 %
NEUTROS ABS: 7.8 10*3/uL — AB (ref 1.7–7.7)
Neutrophils Relative %: 74 %
PLATELETS: 237 10*3/uL (ref 150–400)
RBC: 5.06 MIL/uL (ref 3.87–5.11)
RDW: 15 % (ref 11.5–15.5)
WBC: 10.6 10*3/uL — AB (ref 4.0–10.5)

## 2016-09-13 LAB — BASIC METABOLIC PANEL
Anion gap: 7 (ref 5–15)
BUN: 14 mg/dL (ref 6–20)
CHLORIDE: 104 mmol/L (ref 101–111)
CO2: 27 mmol/L (ref 22–32)
CREATININE: 0.98 mg/dL (ref 0.44–1.00)
Calcium: 9.4 mg/dL (ref 8.9–10.3)
GFR calc Af Amer: >60 mL/min (ref >60)
GFR calc non Af Amer: 57 mL/min — ABNORMAL LOW (ref >60)
Glucose, Bld: 122 mg/dL — ABNORMAL HIGH (ref 65–99)
POTASSIUM: 4 mmol/L (ref 3.5–5.1)
SODIUM: 138 mmol/L (ref 135–145)

## 2016-09-13 LAB — HEMOGLOBIN AND HEMATOCRIT, BLOOD
HCT: 42 % (ref 36.0–46.0)
HEMOGLOBIN: 13.9 g/dL (ref 12.0–15.0)

## 2016-09-13 LAB — URINALYSIS, ROUTINE W REFLEX MICROSCOPIC
BILIRUBIN URINE: NEGATIVE
Glucose, UA: NEGATIVE mg/dL
Ketones, ur: NEGATIVE mg/dL
Leukocytes, UA: NEGATIVE
NITRITE: NEGATIVE
PH: 6 (ref 5.0–8.0)
Protein, ur: NEGATIVE mg/dL

## 2016-09-13 LAB — URINE MICROSCOPIC-ADD ON

## 2016-09-13 MED ORDER — DILTIAZEM HCL ER COATED BEADS 240 MG PO CP24
240.0000 mg | ORAL_CAPSULE | Freq: Once | ORAL | Status: AC
  Administered 2016-09-13: 240 mg via ORAL
  Filled 2016-09-13: qty 1

## 2016-09-13 MED ORDER — DILTIAZEM HCL ER COATED BEADS 240 MG PO CP24: 240.0000 mg | ORAL_CAPSULE | Freq: Every day | ORAL | 0 refills | Status: DC

## 2016-09-13 MED ORDER — DILTIAZEM HCL 25 MG/5ML IV SOLN
10.0000 mg | Freq: Once | INTRAVENOUS | Status: AC
  Administered 2016-09-13: 10 mg via INTRAVENOUS
  Filled 2016-09-13: qty 5

## 2016-09-13 NOTE — Progress Notes (Signed)
Cardiology Office Note  Date: 09/14/2016   ID: Vanessa, Anderson 1946-06-16, MRN [id number]  PCP: Wende Neighbors, MD  Primary Cardiologist: Rozann Lesches, MD   Chief Complaint  Patient presents with  . PAF    History of Present Illness: Vanessa Anderson is a 70 y.o. female last seen in July. She is here with her daughter for a follow-up visit. I reviewed the chart, she was seen in the ER yesterday with vaginal bleeding and also paroxysmal atrial fibrillation with RVR. She had imaging studies including pelvic ultrasound and CT, is scheduled to see Dr. Elonda Husky on Monday with findings of abnormally thickened endometrium. She states that she had a significant amount of bleeding initially, only spotting today. She was not taken off Eliquis. Case was discussed with Dr. Johnsie Cancel who recommended increasing Cardizem CD to 240 mg daily. In talking with the patient and her daughter today, it seems that she actually ran out of her Cardizem CD 120 mg daily and this may be why her heart rate was up.  Today her heart rate is regular and in the 60s in sinus rhythm, had not actually taken any medications yet this morning. She does not report any palpitations or chest pain.  Past Medical History:  Diagnosis Date  . Atrial fibrillation ([place])    In the setting of influenza  . Essential hypertension, benign   . Fibromyalgia     Past Surgical History:  Procedure Laterality Date  . CESAREAN SECTION    . COLONOSCOPY N/A 05/28/2014   Procedure: COLONOSCOPY;  Surgeon: Danie Binder, MD;  [facility]: [facility];  Service: Endoscopy;  Laterality: N/A;  8:30 AM    Current Outpatient Prescriptions  Medication Sig Dispense Refill  . apixaban (ELIQUIS) 5 MG TABS tablet Take 1 tablet (5 mg total) by mouth 2 (two) times daily. 180 tablet 3  . diltiazem (CARDIZEM CD) 240 MG 24 hr capsule Take 1 capsule (240 mg total) by mouth daily. 30 capsule 0  . losartan-hydrochlorothiazide (HYZAAR) 100-12.5 MG tablet Take 1  tablet by mouth daily.      No current facility-administered medications for this visit.    Allergies:  Patient has no known allergies.   Social History: The patient  reports that she has never smoked. She has never used smokeless tobacco. She reports that she does not drink alcohol or use drugs.   ROS:  Please see the history of present illness. Otherwise, complete review of systems is positive for none.  All other systems are reviewed and negative.   Physical Exam: VS:  BP (!) 120/58   Pulse 66   Ht 5\' 2"  (1.575 m)   Wt 176 lb (79.8 kg)   SpO2 99%   BMI 32.19 kg/m , BMI Body mass index is 32.19 kg/m.  Wt Readings from Last 3 Encounters:  09/14/16 176 lb (79.8 kg)  09/13/16 180 lb (81.6 kg)  05/15/16 185 lb (83.9 kg)    Patient appears comfortable at rest. HEENT: Conjunctiva and lids normal, oropharynx clear. Neck: Supple, no elevated JVP or carotid bruits, no thyromegaly. Lungs: Clear to auscultation, nonlabored breathing at rest. Cardiac: Regular rate and rhythm, no S3 or significant systolic murmur, no pericardial rub. Abdomen: Soft, nontender, bowel sounds present. Extremities: No pitting edema, distal pulses 2+. Skin: Warm and dry. Musculoskeletal: No kyphosis or Neuropsychiatric: Alert and oriented 3, affect appropriate.  ECG: I personally reviewed the tracing from 03/14/2016 which showed normal sinus rhythm with LVH and repolarization  abnormalities.  Recent Labwork: 09/13/2016: BUN 14; Creatinine, Ser 0.98; Hemoglobin 13.9; Platelets 237; Potassium 4.0; Sodium 138   Other Studies Reviewed Today:  Echocardiogram 03/29/2016: Study Conclusions  - Left ventricle: The cavity size was normal. Wall thickness was  increased in a pattern of mild LVH. Systolic function was normal.  The estimated ejection fraction was in the range of 55% to 60%.  Wall motion was normal; there were no regional wall motion  abnormalities. Left ventricular diastolic function  parameters  were normal for the patient&'s age. - Aortic valve: Mildly calcified annulus. Trileaflet. - Mitral valve: There was mild regurgitation. - Left atrium: The atrium was mildly dilated. - Right atrium: Central venous pressure (est): 3 mm Hg. - Tricuspid valve: There was trivial regurgitation. - Pulmonary arteries: Systolic pressure could not be accurately  estimated. - Pericardium, extracardiac: A small pericardial effusion was  identified posterior to the heart.  Impressions:  - Mild LVH with LVEF 55-60% and grossly normal diastolic function.  Mild left atrial enlargement. Mild mitral regurgitation. Mildly  sclerotic aortic annulus. Trivial tricuspid regurgitation. Small  posterior pericardial effusion.  Assessment and Plan:  1. Paroxysmal atrial fibrillation, in sinus rhythm today with heart rate in the 60s. Recommending to continue Cardizem CD at 120 mg daily, confirm that she has the prescription available at home. We will have her follow-up for a nurse visit to check vital signs in one week  2. Vaginal bleeding with abnormal thickening of the endometrium in the postmenopausal state. She will be seeing Dr. Elonda Husky on Monday. I have recommend that she stop Eliquis for now pending further evaluation.  3. Essential hypertension, blood pressure is adequately controlled today.  Current medicines were reviewed with the patient today.  Disposition: Nurse visit in one week. 4 month visit with me, sooner if needed.  Signed, Satira Sark, MD, [facility] 09/14/2016 9:45 AM     [facility] [address], [place], [place] [address] Phone: [phone]; Fax: [phone]

## 2016-09-13 NOTE — ED Notes (Signed)
Pt alert & oriented x4, stable gait. Patient given discharge instructions, paperwork & prescription(s). Patient verbalized understanding. Pt left department in wheelchair escorted by staff. Pt left w/ no further questions. 

## 2016-09-13 NOTE — ED Provider Notes (Signed)
[place] DEPT Provider Note   CSN: [id number] Arrival date & time: 09/13/16  [id number]     History   Chief Complaint Chief Complaint  Patient presents with  . Abdominal Pain  . Hematuria  . Vaginal Bleeding    HPI Vanessa Anderson is a 70 y.o. female.  HPI  Pt was seen at 1250. Per pt, c/o gradual onset and persistence of multiple intermittent episodes of "seeing blood in the commode" that began overnight last night. Pt states when she urinates, she "sees blood," possibly vaginal or urine.  Has been associated with very mild lower abd "pain." Pt states she was dx with UTI approximately 2 months ago and rx abx by her PMD. Denies back/flank pain, no N/V/D, no fevers.   Past Medical History:  Diagnosis Date  . Atrial fibrillation ([place])    In the setting of influenza  . Essential hypertension, benign   . Fibromyalgia     Patient Active Problem List   Diagnosis Date Noted  . Influenza A 11/26/2013  . Cough 11/26/2013  . PAF (paroxysmal atrial fibrillation) ([place]) 11/26/2013  . Essential hypertension, benign     Past Surgical History:  Procedure Laterality Date  . CESAREAN SECTION    . COLONOSCOPY N/A 05/28/2014   Procedure: COLONOSCOPY;  Surgeon: Danie Binder, MD;  Location: [facility];  Service: Endoscopy;  Laterality: N/A;  8:30 AM    OB History    No data available       Home Medications    Prior to Admission medications   Medication Sig Start Date End Date Taking? Authorizing Provider  apixaban (ELIQUIS) 5 MG TABS tablet Take 1 tablet (5 mg total) by mouth 2 (two) times daily. 05/31/16  Yes Satira Sark, MD  diltiazem (CARDIZEM CD) 120 MG 24 hr capsule Take 1 capsule (120 mg total) by mouth daily. 05/31/16  Yes Satira Sark, MD  losartan-hydrochlorothiazide (HYZAAR) 100-12.5 MG tablet 1 tablet daily. 08/03/16  Yes Historical Provider, MD    Family History History reviewed. No pertinent family history.  Social History Social History  Substance  Use Topics  . Smoking status: Never Smoker  . Smokeless tobacco: Never Used  . Alcohol use No     Allergies   Patient has no known allergies.   Review of Systems Review of Systems ROS: Statement: All systems negative except as marked or noted in the HPI; Constitutional: Negative for fever and chills. ; ; Eyes: Negative for eye pain, redness and discharge. ; ; ENMT: Negative for ear pain, hoarseness, nasal congestion, sinus pressure and sore throat. ; ; Cardiovascular: Negative for chest pain, palpitations, diaphoresis, dyspnea and peripheral edema. ; ; Respiratory: Negative for cough, wheezing and stridor. ; ; Gastrointestinal: Negative for nausea, vomiting, diarrhea, abdominal pain, blood in stool, hematemesis, jaundice and rectal bleeding. . ; ; Genitourinary: Negative for dysuria, flank pain and +urinary frequency, +hematuria. ; ; GYN:  No pelvic pain, +vaginal bleeding, no vaginal discharge, no vulvar pain. ;; Musculoskeletal: Negative for back pain and neck pain. Negative for swelling and trauma.; ; Skin: Negative for pruritus, rash, abrasions, blisters, bruising and skin lesion.; ; Neuro: Negative for headache, lightheadedness and neck stiffness. Negative for weakness, altered level of consciousness, altered mental status, extremity weakness, paresthesias, involuntary movement, seizure and syncope.      Physical Exam Updated Vital Signs BP 128/67   Pulse 105   Temp 98.5 F (36.9 C) (Oral)   Resp 20   Ht 5\' 2"  (1.575  m)   Wt 180 lb (81.6 kg)   SpO2 99%   BMI 32.92 kg/m    Patient Vitals for the past 24 hrs:  BP Temp Temp src Pulse Resp SpO2 Height Weight  09/13/16 1930 125/67 - - 93 21 100 % - -  09/13/16 1918 124/81 - - 106 19 99 % - -  09/13/16 1830 140/68 - - 89 21 99 % - -  09/13/16 1800 111/68 - - 72 18 98 % - -  09/13/16 1730 140/69 - - 77 11 99 % - -  09/13/16 1700 129/69 - - 62 10 99 % - -  09/13/16 1630 136/75 - - - 20 97 % - -  09/13/16 1600 117/61 - - - 16 98 %  - -  09/13/16 1530 149/79 - - - 17 98 % - -  09/13/16 1500 129/74 - - - 15 97 % - -  09/13/16 1430 143/72 - - - 15 97 % - -  09/13/16 1400 121/61 - - - 20 98 % - -  09/13/16 1330 127/87 - - 79 19 98 % - -  09/13/16 1230 128/67 - - 105 - 99 % - -  09/13/16 1200 151/73 - - (!) 47 - 98 % - -  09/13/16 1130 135/79 - - 81 - 97 % - -  09/13/16 0955 135/62 98.5 F (36.9 C) Oral 66 20 98 % 5\' 2"  (1.575 m) 180 lb (81.6 kg)      Physical Exam 1255: Physical examination:  Nursing notes reviewed; Vital signs and O2 SAT reviewed;  Constitutional: Well developed, Well nourished, Well hydrated, In no acute distress; Head:  Normocephalic, atraumatic; Eyes: EOMI, PERRL, No scleral icterus; ENMT: Mouth and pharynx normal, Mucous membranes moist; Neck: Supple, Full range of motion, No lymphadenopathy; Cardiovascular: Irregular rate and rhythm, No gallop; Respiratory: Breath sounds clear & equal bilaterally, No wheezes.  Speaking full sentences with ease, Normal respiratory effort/excursion; Chest: Nontender, Movement normal; Abdomen: Soft, Nontender, Nondistended, Normal bowel sounds; Genitourinary: No CVA tenderness. Pelvic exam performed with permission of pt and female ED tech assist during exam.  External genitalia w/o lesions. Vaginal vault without discharge, +dark blood.  Cervix w/o lesions, not friable;;; Spine:  No midline CS, TS, LS tenderness.;; Extremities: Pulses normal, No tenderness, No edema, No calf edema or asymmetry.; Neuro: AA&Ox3, Major CN grossly intact.  Speech clear. No gross focal motor or sensory deficits in extremities.; Skin: Color normal, Warm, Dry.   ED Treatments / Results  Labs (all labs ordered are listed, but only abnormal results are displayed)   EKG  EKG Interpretation  Date/Time:  Thursday September 13 2016 13:13:16 EST Ventricular Rate:  96 PR Interval:    QRS Duration: 94 QT Interval:  316 QTC Calculation: 400 R Axis:   71 Text Interpretation:  Atrial fibrillation  Ventricular premature complex Borderline repol abnormality, diffuse leads When compared with ECG of 11/26/2013 Rate slower Confirmed by [facility]  MD, Nunzio Cory [phone]) on 09/13/2016 1:22:29 PM       Radiology   Procedures Procedures (including critical care time)  Medications Ordered in ED Medications - No data to display   Initial Impression / Assessment and Plan / ED Course  I have reviewed the triage vital signs and the nursing notes.  Pertinent labs & imaging results that were available during my care of the patient were reviewed by me and considered in my medical decision making (see chart for details).  MDM Reviewed: previous chart, nursing  note and vitals Reviewed previous: labs Interpretation: labs, CT scan and ultrasound   Results for orders placed or performed during the hospital encounter of 09/13/16  CBC with Differential  Result Value Ref Range   WBC 10.6 (H) 4.0 - 10.5 K/uL   RBC 5.06 3.87 - 5.11 MIL/uL   Hemoglobin 14.1 12.0 - 15.0 g/dL   HCT 43.5 36.0 - 46.0 %   MCV 86.0 78.0 - 100.0 fL   MCH 27.9 26.0 - 34.0 pg   MCHC 32.4 30.0 - 36.0 g/dL   RDW 15.0 11.5 - 15.5 %   Platelets 237 150 - 400 K/uL   Neutrophils Relative % 74 %   Neutro Abs 7.8 (H) 1.7 - 7.7 K/uL   Lymphocytes Relative 18 %   Lymphs Abs 1.9 0.7 - 4.0 K/uL   Monocytes Relative 7 %   Monocytes Absolute 0.7 0.1 - 1.0 K/uL   Eosinophils Relative 1 %   Eosinophils Absolute 0.1 0.0 - 0.7 K/uL   Basophils Relative 0 %   Basophils Absolute 0.0 0.0 - 0.1 K/uL  Basic metabolic panel  Result Value Ref Range   Sodium 138 135 - 145 mmol/L   Potassium 4.0 3.5 - 5.1 mmol/L   Chloride 104 101 - 111 mmol/L   CO2 27 22 - 32 mmol/L   Glucose, Bld 122 (H) 65 - 99 mg/dL   BUN 14 6 - 20 mg/dL   Creatinine, Ser 0.98 0.44 - 1.00 mg/dL   Calcium 9.4 8.9 - 10.3 mg/dL   GFR calc non Af Amer 57 (L) >60 mL/min   GFR calc Af Amer >60 >60 mL/min   Anion gap 7 5 - 15  Urinalysis, Routine w reflex microscopic    Result Value Ref Range   Color, Urine STRAW (A) YELLOW   APPearance HAZY (A) CLEAR   Specific Gravity, Urine <1.005 (L) 1.005 - 1.030   pH 6.0 5.0 - 8.0   Glucose, UA NEGATIVE NEGATIVE mg/dL   Hgb urine dipstick LARGE (A) NEGATIVE   Bilirubin Urine NEGATIVE NEGATIVE   Ketones, ur NEGATIVE NEGATIVE mg/dL   Protein, ur NEGATIVE NEGATIVE mg/dL   Nitrite NEGATIVE NEGATIVE   Leukocytes, UA NEGATIVE NEGATIVE  Urine microscopic-add on  Result Value Ref Range   Squamous Epithelial / LPF 0-5 (A) NONE SEEN   WBC, UA 0-5 0 - 5 WBC/hpf   RBC / HPF TOO NUMEROUS TO COUNT 0 - 5 RBC/hpf   Bacteria, UA FEW (A) NONE SEEN  Hemoglobin and hematocrit, blood  Result Value Ref Range   Hemoglobin 13.9 12.0 - 15.0 g/dL   HCT 42.0 36.0 - 46.0 %   US Pelvis Complete Result Date: 09/13/2016 CLINICAL DATA:  69 year old female with postmenopausal bleeding and endometrial thickening on CT study from earlier today. EXAM: TRANSABDOMINAL AND TRANSVAGINAL ULTRASOUND OF PELVIS TECHNIQUE: Both transabdominal and transvaginal ultrasound examinations of the pelvis were performed. Transabdominal technique was performed for global imaging of the pelvis including uterus, ovaries, adnexal regions, and pelvic cul-de-sac. It was necessary to proceed with endovaginal exam following the transabdominal exam to visualize the endometrium and adnexa. COMPARISON:  09/13/2016 unenhanced CT abdomen/ pelvis. FINDINGS: Uterus Measurements: 10.1 x 5.2 x 5.1 cm. Uterus is anteverted and mildly enlarged. There is a solitary subserosal 3.6 x 2.8 x 2.5 cm posterior uterine body fibroid. Endometrium Thickness: 16 mm. Poor endometrial visualization on the transvaginal images due to uterine position. Abnormally thickened heterogeneous endometrium based on the transabdominal images. There is heterogeneous hypoechoic material distending  the endocervical canal up to 22 mm thickness, with apparent internal vascularity on color Doppler. Right ovary  Nonvisualization of the right ovary.  No right adnexal mass. Left ovary Measurements: 1.8 x 1.1 x 1.3 cm (transabdominal measurements). Normal appearance/no adnexal mass. Other findings No free fluid. IMPRESSION: 1. Abnormally thickened (16 mm) heterogeneous endometrium. In the setting of post-menopausal bleeding, endometrial sampling is indicated to exclude carcinoma. If results are benign, sonohysterogram should be considered for focal lesion work-up. (Ref: Radiological Reasoning: Algorithmic Workup of Abnormal Vaginal Bleeding with Endovaginal Sonography and Sonohysterography. AJR 2008GA:7881869). 2. Distention of the endocervical canal up to 22 mm thickness by heterogeneous hypoechoic material with internal vascularity, worrisome for endocervical extension of endometrial malignancy and/or blood products. 3. Solitary subserosal posterior uterine body fibroid. 4. Nonvisualization of the right ovary. Normal left ovary. No adnexal masses. 5. No free fluid in the pelvis. Electronically Signed   By: Ilona Sorrel M.D.   On: 09/13/2016 17:18   Ct Renal Stone Study Result Date: 09/13/2016 CLINICAL DATA:  Standard/full stone protocol used; Patient complaining of abdominal pain and vaginal bleeding that started last night; hematuria EXAM: CT ABDOMEN AND PELVIS WITHOUT CONTRAST TECHNIQUE: Multidetector CT imaging of the abdomen and pelvis was performed following the standard protocol without IV contrast. COMPARISON:  06/02/2014 FINDINGS: Lower chest: Minimal bibasilar atelectasis. Trace pericardial effusion. Heart size is normal. Hepatobiliary: Within the posterior segment of the right hepatic lobe there is a 12 mm oval low-attenuation lesion. Within the anterior segment of the right hepatic lobe there is an 11 mm low-attenuation lesion. Small calcified granulomata are identified throughout the liver. Gallbladder is present and normal in CT appearance. Pancreas: Unremarkable. No pancreatic ductal dilatation or  surrounding inflammatory changes. Spleen: Normal in size without focal abnormality. Adrenals/Urinary Tract: Adrenal glands are unremarkable. Kidneys are normal, without renal calculi, focal lesion, or hydronephrosis. Bladder is unremarkable. Stomach/Bowel: The stomach and small bowel loops are normal in appearance. The appendix is well seen and has a normal appearance. Colonic loops are normal in appearance. Vascular/Lymphatic: No evidence for aortic aneurysm. No retroperitoneal or mesenteric adenopathy. Reproductive: Uterus is present. The region of the endometrium measures approximately 13 mm. Question of endometrial thickening. Posterior uterine fibroid measures approximate 3.9 cm. No adnexal mass. Other: There is no free pelvic fluid. Abdominal wall is unremarkable. Musculoskeletal: No acute or significant osseous findings. IMPRESSION: 1. Suspect endometrial thickening. Given the presence of postmenopausal bleeding, further evaluation with pelvic ultrasound is recommended. 2. Posterior uterine fibroid probably stable. 3. No intrarenal or ureteral stones. 4. Normal appendix. 5. Small probably benign hepatic lesions not further characterized. 6. Hepatic granulomata. Electronically Signed   By: Nolon Nations M.D.   On: 09/13/2016 15:42      1610:  Pt's monitor afib; HR varies from 90's to 150's, increases especially on exertion. Pt states she has hx of same, has f/u appt with Cards MD tomorrow. Denies any c/o CP/palpitaitons/SOB. Appears comfortable, NAD, resps easy.   T/C to [facility] Cards Dr. Johnsie Cancel, case discussed, including:  HPI, pertinent PM/SHx, VS/PE, dx testing, ED course and treatment:  Requests to give IV cardizem 10mg  bolus followed 1/2 hour later by PO cardizem 240mg  now, and have pt take this new dose daily, f/u ofc tomorrow as previously scheduled. CHADSVASC score 3; will defer Eliquis continuation to Cards MD tomorrow.   1955:  HR 80-90's after IV and PO cardizem. BP stable. 2 H/H obtained;  stable. T/C to OB/GYN Dr. Elonda Husky, case discussed, including:  HPI,  pertinent PM/SHx, VS/PE, dx testing, ED course and treatment:  Requests to have pt call ofc tomorrow to obtain appt for Monday for f/u. Dx and testing, as well as d/w Cards MD and OB/GYN MD, d/w pt and family.  Questions answered.  Verb understanding, agreeable to d/c home with outpt f/u with Cards MD tomorrow and OB/GYN MD on Monday. Return precautions given; pt and family verb understanding.     Final Clinical Impressions(s) / ED Diagnoses   Final diagnoses:  None    New Prescriptions New Prescriptions   No medications on file     Francine Graven, DO 09/16/16 1849

## 2016-09-13 NOTE — ED Triage Notes (Signed)
Patient complaining of abdominal pain and vaginal bleeding that started last night.

## 2016-09-13 NOTE — Discharge Instructions (Signed)
Take the new prescription of cardizem as directed.  Call your regular medical doctor today to schedule a follow up appointment within the next week.  Return to the Emergency Department immediately sooner if worsening.

## 2016-09-14 ENCOUNTER — Ambulatory Visit (INDEPENDENT_AMBULATORY_CARE_PROVIDER_SITE_OTHER): Payer: Medicare Other | Admitting: Cardiology

## 2016-09-14 ENCOUNTER — Encounter: Payer: Self-pay | Admitting: Cardiology

## 2016-09-14 VITALS — BP 120/58 | HR 66 | Ht 62.0 in | Wt 176.0 lb

## 2016-09-14 DIAGNOSIS — I10 Essential (primary) hypertension: Secondary | ICD-10-CM

## 2016-09-14 DIAGNOSIS — N950 Postmenopausal bleeding: Secondary | ICD-10-CM

## 2016-09-14 DIAGNOSIS — I480 Paroxysmal atrial fibrillation: Secondary | ICD-10-CM | POA: Diagnosis not present

## 2016-09-14 MED ORDER — DILTIAZEM HCL ER COATED BEADS 120 MG PO CP24: 120.0000 mg | ORAL_CAPSULE | Freq: Every day | ORAL | 3 refills | Status: DC

## 2016-09-14 NOTE — Patient Instructions (Addendum)
Your physician wants you to follow-up in: 4 months You will receive a reminder letter in the mail two months in advance. If you don't receive a letter, please call our office to schedule the follow-up appointment.     HOLD Eliquis till GYN sees you    DECREASE Cardizem to 120 mg daily    BP, Heart rate check in 1 week with nurse   Thank you for choosing [facility] !

## 2016-09-15 LAB — URINE CULTURE

## 2016-09-17 ENCOUNTER — Ambulatory Visit (INDEPENDENT_AMBULATORY_CARE_PROVIDER_SITE_OTHER): Payer: Medicare Other | Admitting: Obstetrics & Gynecology

## 2016-09-17 ENCOUNTER — Other Ambulatory Visit: Payer: Self-pay | Admitting: Obstetrics & Gynecology

## 2016-09-17 ENCOUNTER — Encounter: Payer: Self-pay | Admitting: Obstetrics & Gynecology

## 2016-09-17 VITALS — BP 130/60 | HR 76 | Ht 62.0 in | Wt 178.0 lb

## 2016-09-17 DIAGNOSIS — N950 Postmenopausal bleeding: Secondary | ICD-10-CM | POA: Diagnosis not present

## 2016-09-17 DIAGNOSIS — N9489 Other specified conditions associated with female genital organs and menstrual cycle: Secondary | ICD-10-CM

## 2016-09-17 DIAGNOSIS — R1909 Other intra-abdominal and pelvic swelling, mass and lump: Secondary | ICD-10-CM | POA: Diagnosis not present

## 2016-09-17 NOTE — Addendum Note (Signed)
Addended by: Diona Fanti A on: 09/17/2016 11:39 AM   Modules accepted: Orders

## 2016-09-17 NOTE — Progress Notes (Signed)
Endometrial Biopsy Procedure Note  Pre-operative Diagnosis: Endometrial cancer IMPRESSION: 1. Abnormally thickened (16 mm) heterogeneous endometrium. In the setting of post-menopausal bleeding, endometrial sampling is indicated to exclude carcinoma. If results are benign, sonohysterogram should be considered for focal lesion work-up. (Ref: Radiological Reasoning: Algorithmic Workup of Abnormal Vaginal Bleeding with Endovaginal Sonography and Sonohysterography. AJR [id number]). 2. Distention of the endocervical canal up to 22 mm thickness by heterogeneous hypoechoic material with internal vascularity, worrisome for endocervical extension of endometrial malignancy and/or blood products. 3. Solitary subserosal posterior uterine body fibroid. 4. Nonvisualization of the right ovary. Normal left ovary. No adnexal masses. 5. No free fluid in the pelvis. Electronically Signed   By: Ilona Sorrel M.D.   On: 09/13/2016 17:18  Post-operative Diagnosis: same  Indications: post menopausal bleeding with endometiral mass  Procedure Details   Urine pregnancy test was not done.  The risks (including infection, bleeding, pain, and uterine perforation) and benefits of the procedure were explained to the patient and Written informed consent was obtained.  Antibiotic prophylaxis against endocarditis was not indicated. The cervix was normal no lesions of the cervix was seen.  The patient was placed in the dorsal lithotomy position.  Bimanual exam showed the uterus to be in the neutral position.  A Graves' speculum inserted in the vagina, and the cervix prepped with povidone iodine.  Endocervical curettage with a Kevorkian curette was not performed.   A sharp tenaculum was applied to the anterior lip of the cervix for stabilization.  A sterile uterine sound was used to sound the uterus to a depth of 8.5cm.  A Mylex 38mm curette was used to sample the endometrium.  Sample was sent for pathologic  examination.  Condition: Stable  Complications: None  Plan:  The patient was advised to call for any fever or for prolonged or severe pain or bleeding. She was advised to use none as needed for mild to moderate pain. She was advised to avoid vaginal intercourse for 48 hours or until the bleeding has completely stopped.  Attending Physician Documentation: I was present for or performed the following: endometrial biopsy

## 2016-09-21 ENCOUNTER — Ambulatory Visit (INDEPENDENT_AMBULATORY_CARE_PROVIDER_SITE_OTHER): Payer: Medicare Other

## 2016-09-21 VITALS — BP 138/58 | HR 57

## 2016-09-21 DIAGNOSIS — I480 Paroxysmal atrial fibrillation: Secondary | ICD-10-CM | POA: Diagnosis not present

## 2016-09-21 NOTE — Progress Notes (Signed)
Nurse visit today,remains on cardizem 120 mg daily,has had two brief episodes of tachycardia since last visit,she described as lasting a second each tim,feels good

## 2016-09-21 NOTE — Patient Instructions (Signed)
We will call you if there are any changes to your medicine      Thank you for choosing [facility] !

## 2016-09-21 NOTE — Progress Notes (Signed)
Would continue Cardizem CD 120 mg daily for now.

## 2016-09-25 ENCOUNTER — Ambulatory Visit: Payer: Self-pay | Admitting: Obstetrics & Gynecology

## 2016-09-26 ENCOUNTER — Ambulatory Visit (INDEPENDENT_AMBULATORY_CARE_PROVIDER_SITE_OTHER): Payer: Medicare Other | Admitting: Obstetrics & Gynecology

## 2016-09-26 ENCOUNTER — Encounter: Payer: Self-pay | Admitting: Obstetrics & Gynecology

## 2016-09-26 VITALS — BP 130/70 | HR 80 | Wt 178.0 lb

## 2016-09-26 DIAGNOSIS — N950 Postmenopausal bleeding: Secondary | ICD-10-CM | POA: Diagnosis not present

## 2016-09-26 DIAGNOSIS — N9489 Other specified conditions associated with female genital organs and menstrual cycle: Secondary | ICD-10-CM | POA: Diagnosis not present

## 2016-09-26 NOTE — Progress Notes (Signed)
Follow up appointment for results  Chief Complaint  Patient presents with  . Follow-up    biopsy    Blood pressure 130/70, pulse 80, weight 178 lb (80.7 kg).    Endometrial biopsy revealed blood only and with such a thick heterogenous endometrium unfortunatly pt needs to undergo a formal hysteroscopy and uterine curettage for full evaluation  Pt is unenthusiastic about this management and wants a couple of weeks to mull over her decision She understands the uncertainty involved with this situation and the possibility of the diagnoses involved to include endometrial cancer or even just organized blood clot  MEDS ordered this encounter: No orders of the defined types were placed in this encounter.   Orders for this encounter: No orders of the defined types were placed in this encounter.   Impression: 1. Postmenopausal bleeding   2. Endometrial mass    Plan:   Follow Up: Return in about 2 weeks (around 10/10/2016) for Follow up, with Dr Elonda Husky.       Face to face time:  10 minutes  Greater than 50% of the visit time was spent in counseling and coordination of care with the patient.  The summary and outline of the counseling and care coordination is summarized in the note above.   All questions were answered.  Past Medical History:  Diagnosis Date  . Atrial fibrillation ([place])    In the setting of influenza  . Essential hypertension, benign   . Fibromyalgia     Past Surgical History:  Procedure Laterality Date  . CESAREAN SECTION    . COLONOSCOPY N/A 05/28/2014   Procedure: COLONOSCOPY;  Surgeon: Danie Binder, MD;  Location: [facility];  Service: Endoscopy;  Laterality: N/A;  8:30 AM    OB History    No data available      No Known Allergies  Social History   Social History  . Marital status: Married    Spouse name: N/A  . Number of children: N/A  . Years of education: N/A   Social History Main Topics  . Smoking status: Never Smoker  .  Smokeless tobacco: Never Used  . Alcohol use No  . Drug use: No  . Sexual activity: Not Currently   Other Topics Concern  . None   Social History Narrative  . None    History reviewed. No pertinent family history.

## 2016-10-11 ENCOUNTER — Ambulatory Visit (INDEPENDENT_AMBULATORY_CARE_PROVIDER_SITE_OTHER): Payer: Medicare Other | Admitting: Obstetrics & Gynecology

## 2016-10-11 ENCOUNTER — Encounter: Payer: Self-pay | Admitting: Obstetrics & Gynecology

## 2016-10-11 VITALS — BP 150/60 | HR 70 | Wt 177.0 lb

## 2016-10-11 DIAGNOSIS — N950 Postmenopausal bleeding: Secondary | ICD-10-CM | POA: Diagnosis not present

## 2016-10-11 DIAGNOSIS — N9489 Other specified conditions associated with female genital organs and menstrual cycle: Secondary | ICD-10-CM | POA: Diagnosis not present

## 2016-10-11 DIAGNOSIS — R1909 Other intra-abdominal and pelvic swelling, mass and lump: Secondary | ICD-10-CM

## 2016-10-11 NOTE — Progress Notes (Signed)
Preoperative History and Physical  Vanessa Anderson is a 70 y.o. No obstetric history on file. with No LMP recorded. Patient is postmenopausal. admitted for a hysteroscopy uterine curettage removal of endometrial mass. Office endometrial biopsy did not return anything but blood clot.  The pateitn did not really want to proceed with hysteroscopy uterine curettage but finally decides she will but wants to do it after the holidays.  It si scheduled for 11/07/2016   PMH:    Past Medical History:  Diagnosis Date  . Atrial fibrillation ([place])    In the setting of influenza  . Essential hypertension, benign   . Fibromyalgia     PSH:     Past Surgical History:  Procedure Laterality Date  . CESAREAN SECTION    . COLONOSCOPY N/A 05/28/2014   Procedure: COLONOSCOPY;  Surgeon: Danie Binder, MD;  Location: [facility];  Service: Endoscopy;  Laterality: N/A;  8:30 AM    POb/GynH:      OB History    No data available      SH:   Social History  Substance Use Topics  . Smoking status: Never Smoker  . Smokeless tobacco: Never Used  . Alcohol use No    FH:   History reviewed. No pertinent family history.   Allergies: No Known Allergies  Medications:       Current Outpatient Prescriptions:  .  apixaban (ELIQUIS) 5 MG TABS tablet, Take 1 tablet (5 mg total) by mouth 2 (two) times daily. (Patient taking differently: Take 5 mg by mouth 2 (two) times daily. ON HOLD 09/14/16), Disp: 180 tablet, Rfl: 3 .  diltiazem (CARDIZEM CD) 120 MG 24 hr capsule, Take 1 capsule (120 mg total) by mouth daily., Disp: 90 capsule, Rfl: 3 .  losartan-hydrochlorothiazide (HYZAAR) 100-12.5 MG tablet, Take 1 tablet by mouth daily. , Disp: , Rfl:   Review of Systems:   Review of Systems  Constitutional: Negative for fever, chills, weight loss, malaise/fatigue and diaphoresis.  HENT: Negative for hearing loss, ear pain, nosebleeds, congestion, sore throat, neck pain, tinnitus and ear discharge.   Eyes: Negative  for blurred vision, double vision, photophobia, pain, discharge and redness.  Respiratory: Negative for cough, hemoptysis, sputum production, shortness of breath, wheezing and stridor.   Cardiovascular: Negative for chest pain, palpitations, orthopnea, claudication, leg swelling and PND.  Gastrointestinal: Positive for abdominal pain. Negative for heartburn, nausea, vomiting, diarrhea, constipation, blood in stool and melena.  Genitourinary: Negative for dysuria, urgency, frequency, hematuria and flank pain.  Musculoskeletal: Negative for myalgias, back pain, joint pain and falls.  Skin: Negative for itching and rash.  Neurological: Negative for dizziness, tingling, tremors, sensory change, speech change, focal weakness, seizures, loss of consciousness, weakness and headaches.  Endo/Heme/Allergies: Negative for environmental allergies and polydipsia. Does not bruise/bleed easily.  Psychiatric/Behavioral: Negative for depression, suicidal ideas, hallucinations, memory loss and substance abuse. The patient is not nervous/anxious and does not have insomnia.      PHYSICAL EXAM:  Blood pressure (!) 150/60, pulse 70, weight 177 lb (80.3 kg).    Vitals reviewed. Constitutional: She is oriented to person, place, and time. She appears well-developed and well-nourished.  HENT:  Head: Normocephalic and atraumatic.  Right Ear: External ear normal.  Left Ear: External ear normal.  Nose: Nose normal.  Mouth/Throat: Oropharynx is clear and moist.  Eyes: Conjunctivae and EOM are normal. Pupils are equal, round, and reactive to light. Right eye exhibits no discharge. Left eye exhibits no discharge. No scleral icterus.  Neck: Normal range of motion. Neck supple. No tracheal deviation present. No thyromegaly present.  Cardiovascular: Normal rate, regular rhythm, normal heart sounds and intact distal pulses.  Exam reveals no gallop and no friction rub.   No murmur heard. Respiratory: Effort normal and  breath sounds normal. No respiratory distress. She has no wheezes. She has no rales. She exhibits no tenderness.  GI: Soft. Bowel sounds are normal. She exhibits no distension and no mass. There is tenderness. There is no rebound and no guarding.  Genitourinary:       Vulva is normal without lesions Vagina is pink moist without discharge Cervix normal in appearance and pap is normal Uterus is normal size, contour, position, consistency, mobility, non-tender Adnexa is negative with normal sized ovaries by sonogram  Musculoskeletal: Normal range of motion. She exhibits no edema and no tenderness.  Neurological: She is alert and oriented to person, place, and time. She has normal reflexes. She displays normal reflexes. No cranial nerve deficit. She exhibits normal muscle tone. Coordination normal.  Skin: Skin is warm and dry. No rash noted. No erythema. No pallor.  Psychiatric: She has a normal mood and affect. Her behavior is normal. Judgment and thought content normal.    Labs: No results found for this or any previous visit (from the past 336 hour(s)).  EKG: Orders placed or performed during the hospital encounter of 09/13/16  . EKG 12-Lead  . EKG 12-Lead  . EKG    Imaging Studies: US Transvaginal Non-ob  Result Date: 09/13/2016 CLINICAL DATA:  70 year old female with postmenopausal bleeding and endometrial thickening on CT study from earlier today. EXAM: TRANSABDOMINAL AND TRANSVAGINAL ULTRASOUND OF PELVIS TECHNIQUE: Both transabdominal and transvaginal ultrasound examinations of the pelvis were performed. Transabdominal technique was performed for global imaging of the pelvis including uterus, ovaries, adnexal regions, and pelvic cul-de-sac. It was necessary to proceed with endovaginal exam following the transabdominal exam to visualize the endometrium and adnexa. COMPARISON:  09/13/2016 unenhanced CT abdomen/ pelvis. FINDINGS: Uterus Measurements: 10.1 x 5.2 x 5.1 cm. Uterus is  anteverted and mildly enlarged. There is a solitary subserosal 3.6 x 2.8 x 2.5 cm posterior uterine body fibroid. Endometrium Thickness: 16 mm. Poor endometrial visualization on the transvaginal images due to uterine position. Abnormally thickened heterogeneous endometrium based on the transabdominal images. There is heterogeneous hypoechoic material distending the endocervical canal up to 22 mm thickness, with apparent internal vascularity on color Doppler. Right ovary Nonvisualization of the right ovary.  No right adnexal mass. Left ovary Measurements: 1.8 x 1.1 x 1.3 cm (transabdominal measurements). Normal appearance/no adnexal mass. Other findings No free fluid. IMPRESSION: 1. Abnormally thickened (16 mm) heterogeneous endometrium. In the setting of post-menopausal bleeding, endometrial sampling is indicated to exclude carcinoma. If results are benign, sonohysterogram should be considered for focal lesion work-up. (Ref: Radiological Reasoning: Algorithmic Workup of Abnormal Vaginal Bleeding with Endovaginal Sonography and Sonohysterography. AJR [id number]). 2. Distention of the endocervical canal up to 22 mm thickness by heterogeneous hypoechoic material with internal vascularity, worrisome for endocervical extension of endometrial malignancy and/or blood products. 3. Solitary subserosal posterior uterine body fibroid. 4. Nonvisualization of the right ovary. Normal left ovary. No adnexal masses. 5. No free fluid in the pelvis. Electronically Signed   By: Ilona Sorrel M.D.   On: 09/13/2016 17:18   US Pelvis Complete  Result Date: 09/13/2016 CLINICAL DATA:  70 year old female with postmenopausal bleeding and endometrial thickening on CT study from earlier today. EXAM: TRANSABDOMINAL AND TRANSVAGINAL ULTRASOUND OF PELVIS  TECHNIQUE: Both transabdominal and transvaginal ultrasound examinations of the pelvis were performed. Transabdominal technique was performed for global imaging of the pelvis including  uterus, ovaries, adnexal regions, and pelvic cul-de-sac. It was necessary to proceed with endovaginal exam following the transabdominal exam to visualize the endometrium and adnexa. COMPARISON:  09/13/2016 unenhanced CT abdomen/ pelvis. FINDINGS: Uterus Measurements: 10.1 x 5.2 x 5.1 cm. Uterus is anteverted and mildly enlarged. There is a solitary subserosal 3.6 x 2.8 x 2.5 cm posterior uterine body fibroid. Endometrium Thickness: 16 mm. Poor endometrial visualization on the transvaginal images due to uterine position. Abnormally thickened heterogeneous endometrium based on the transabdominal images. There is heterogeneous hypoechoic material distending the endocervical canal up to 22 mm thickness, with apparent internal vascularity on color Doppler. Right ovary Nonvisualization of the right ovary.  No right adnexal mass. Left ovary Measurements: 1.8 x 1.1 x 1.3 cm (transabdominal measurements). Normal appearance/no adnexal mass. Other findings No free fluid. IMPRESSION: 1. Abnormally thickened (16 mm) heterogeneous endometrium. In the setting of post-menopausal bleeding, endometrial sampling is indicated to exclude carcinoma. If results are benign, sonohysterogram should be considered for focal lesion work-up. (Ref: Radiological Reasoning: Algorithmic Workup of Abnormal Vaginal Bleeding with Endovaginal Sonography and Sonohysterography. AJR [id number]). 2. Distention of the endocervical canal up to 22 mm thickness by heterogeneous hypoechoic material with internal vascularity, worrisome for endocervical extension of endometrial malignancy and/or blood products. 3. Solitary subserosal posterior uterine body fibroid. 4. Nonvisualization of the right ovary. Normal left ovary. No adnexal masses. 5. No free fluid in the pelvis. Electronically Signed   By: Ilona Sorrel M.D.   On: 09/13/2016 17:18   Ct Renal Stone Study  Result Date: 09/13/2016 CLINICAL DATA:  Standard/full stone protocol used; Patient  complaining of abdominal pain and vaginal bleeding that started last night; hematuria EXAM: CT ABDOMEN AND PELVIS WITHOUT CONTRAST TECHNIQUE: Multidetector CT imaging of the abdomen and pelvis was performed following the standard protocol without IV contrast. COMPARISON:  06/02/2014 FINDINGS: Lower chest: Minimal bibasilar atelectasis. Trace pericardial effusion. Heart size is normal. Hepatobiliary: Within the posterior segment of the right hepatic lobe there is a 12 mm oval low-attenuation lesion. Within the anterior segment of the right hepatic lobe there is an 11 mm low-attenuation lesion. Small calcified granulomata are identified throughout the liver. Gallbladder is present and normal in CT appearance. Pancreas: Unremarkable. No pancreatic ductal dilatation or surrounding inflammatory changes. Spleen: Normal in size without focal abnormality. Adrenals/Urinary Tract: Adrenal glands are unremarkable. Kidneys are normal, without renal calculi, focal lesion, or hydronephrosis. Bladder is unremarkable. Stomach/Bowel: The stomach and small bowel loops are normal in appearance. The appendix is well seen and has a normal appearance. Colonic loops are normal in appearance. Vascular/Lymphatic: No evidence for aortic aneurysm. No retroperitoneal or mesenteric adenopathy. Reproductive: Uterus is present. The region of the endometrium measures approximately 13 mm. Question of endometrial thickening. Posterior uterine fibroid measures approximate 3.9 cm. No adnexal mass. Other: There is no free pelvic fluid. Abdominal wall is unremarkable. Musculoskeletal: No acute or significant osseous findings. IMPRESSION: 1. Suspect endometrial thickening. Given the presence of postmenopausal bleeding, further evaluation with pelvic ultrasound is recommended. 2. Posterior uterine fibroid probably stable. 3. No intrarenal or ureteral stones. 4. Normal appendix. 5. Small probably benign hepatic lesions not further characterized. 6. Hepatic  granulomata. Electronically Signed   By: Nolon Nations M.D.   On: 09/13/2016 15:42      Assessment: Postmenopausal bleeding Endometrial mass Office biopsy was non diagnostic Patient Active Problem  List   Diagnosis Date Noted  . Influenza A 11/26/2013  . Cough 11/26/2013  . PAF (paroxysmal atrial fibrillation) ([place]) 11/26/2013  . Essential hypertension, benign     Plan: Hysteroscopy uterine curettage removal of endometrial mass 11/07/2016  Hyatt Capobianco H 10/11/2016 4:13 PM

## 2016-10-12 ENCOUNTER — Ambulatory Visit (INDEPENDENT_AMBULATORY_CARE_PROVIDER_SITE_OTHER): Payer: Medicare Other | Admitting: Adult Health

## 2016-10-12 ENCOUNTER — Encounter: Payer: Self-pay | Admitting: Adult Health

## 2016-10-12 VITALS — BP 142/68 | HR 72 | Ht 62.0 in | Wt 176.0 lb

## 2016-10-12 DIAGNOSIS — I480 Paroxysmal atrial fibrillation: Secondary | ICD-10-CM | POA: Diagnosis not present

## 2016-10-12 DIAGNOSIS — I10 Essential (primary) hypertension: Secondary | ICD-10-CM | POA: Diagnosis not present

## 2016-10-12 NOTE — Progress Notes (Signed)
One is in place Cardiology Office Note   Date:  10/12/2016   ID:  Vanessa, [name] 07/17/1946, MRN [id number]  PCP:  Wende Neighbors, MD  Cardiologist:  [place]/ Jory Sims, NP   No chief complaint on file.     History of Present Illness: Vanessa Anderson is a 70 y.o. female who presents for ongoing assessment and management of paroxysmal atrial fibrillation, with history of abnormally t with vaginal bleeding. She was taken off of Eliquis,Diltiazem was increased to 240 mg daily.She was last seen in the office by Dr. [name] heart rate was well-controlled. Dr. [name] found that the patient had run out of her diltiazem which prompted rapid atrial fib. He therefore reduced her diltiazem back to 120 mg daily as she was bradycardic in the office that day. Follow up appointment to check BP and HR. BP and Heart rate were controlled on follow up with no increase or change in diltiazem dose.   She is here today without any complaints of dizziness, chest pain, or dyspnea. She did have one episode of rapid heart rate yesterday. It went away on its own after a few minutes. She is scheduled to have a D&C in January.  Past Medical History:  Diagnosis Date  . Atrial fibrillation ([place])    In the setting of influenza  . Essential hypertension, benign   . Fibromyalgia     Past Surgical History:  Procedure Laterality Date  . CESAREAN SECTION    . COLONOSCOPY N/A 05/28/2014   Procedure: COLONOSCOPY;  Surgeon: Danie Binder, MD;  [facility];  Service: Endoscopy;  Laterality: N/A;  8:30 AM     Current Outpatient Prescriptions  Medication Sig Dispense Refill  . apixaban (ELIQUIS) 5 MG TABS tablet Take 1 tablet (5 mg total) by mouth 2 (two) times daily. (Patient taking differently: Take 5 mg by mouth 2 (two) times daily. ON HOLD 09/14/16) 180 tablet 3  . diltiazem (CARDIZEM CD) 120 MG 24 hr capsule Take 1 capsule (120 mg total) by mouth daily. 90 capsule 3  .  losartan-hydrochlorothiazide (HYZAAR) 100-12.5 MG tablet Take 1 tablet by mouth daily.      No current facility-administered medications for this visit.     Allergies:   Patient has no known allergies.    Social History:  The patient  reports that she has never smoked. She has never used smokeless tobacco. She reports that she does not drink alcohol or use drugs.   Family History:  The patient's family history is not on file.    ROS: All other systems are reviewed and negative. Unless otherwise mentioned in H&P    PHYSICAL EXAM: VS:  There were no vitals taken for this visit. , BMI There is no height or weight on file to calculate BMI. GEN: Well nourished, well developed, in no acute distress  HEENT: normal  Neck: no JVD, carotid bruits, or masses Cardiac RRR; bradycardic,  no murmurs, rubs, or gallops,no edema  Respiratory:  clear to auscultation bilaterally, normal work of breathing GI: soft, nontender, nondistended, + BS MS: no deformity or atrophy  Skin: warm and dry, no rash Neuro:  Strength and sensation are intact Psych: euthymic mood, full affect   EKG:  The ekg ordered today demonstrates sinus bradycardia heart rate of 54 bpm.   Recent Labs: 09/13/2016: BUN 14; Creatinine, Ser 0.98; Hemoglobin 13.9; Platelets 237; Potassium 4.0; Sodium 138    Lipid Panel No results found for: CHOL, TRIG, HDL, CHOLHDL,  VLDL, LDLCALC, LDLDIRECT    Wt Readings from Last 3 Encounters:  10/11/16 177 lb (80.3 kg)  09/26/16 178 lb (80.7 kg)  09/17/16 178 lb (80.7 kg)     ASSESSMENT AND PLAN:  1.  Paroxysmal atrial fibrillation: Heart rate is well controlled on diltiazem 120 mg daily. She reports one episode of rapid heart rhythm at home yesterday. Going away on its own. As result of recurrent rapid heart rhythm, I will not make any changes in her diltiazem dose despite bradycardia. She will continue on anticoagulation therapy.  2. Hypertension: Hypertensive initially when she first  came in the office for rechecked manually and it normalized to 136/68. I will not make any changes in her medication regimen at this time.  3. Preoperative evaluation: She is due to have a D&C within the next week or 2. She is stable from a cardiac standpoint to proceed with surgery as this is low risk. She will need to discuss with her surgeon concerning holding ELIQUIS prior to procedure. We recommend 48 hours. She will need to restarted as soon as possible thereafter.   Current medicines are reviewed at length with the patient today.    Labs/ tests ordered today include:  No orders of the defined types were placed in this encounter.    Disposition:   FU with one month.   Signed, Jory Sims, NP  10/12/2016 7:05 AM    [facility] [address] [place], [place] [address] Phone: [phone]; Fax: [phone]

## 2016-10-12 NOTE — Progress Notes (Signed)
Name: Vanessa Anderson    DOB: Oct 10, 1946  Age: 70 y.o.  MR#: [id number]       PCP:  Wende Neighbors, MD      Insurance: Payor: [employer] MEDICARE / Plan: [facility] MEDICARE / Product Type: *No Product type* /   CC:   No chief complaint on file.   VS Vitals:   10/12/16 1436  Weight: 176 lb (79.8 kg)  Height: 5\' 2"  (1.575 m)    Weights Current Weight  10/12/16 176 lb (79.8 kg)  10/11/16 177 lb (80.3 kg)  09/26/16 178 lb (80.7 kg)    Blood Pressure  BP Readings from Last 3 Encounters:  10/11/16 (!) 150/60  09/26/16 130/70  09/21/16 (!) 138/58     Admit date:  (Not on file) Last encounter with RMR:  Visit date not found   Allergy Patient has no known allergies.  Current Outpatient Prescriptions  Medication Sig Dispense Refill  . apixaban (ELIQUIS) 5 MG TABS tablet Take 1 tablet (5 mg total) by mouth 2 (two) times daily. (Patient taking differently: Take 5 mg by mouth 2 (two) times daily. ON HOLD 09/14/16) 180 tablet 3  . diltiazem (CARDIZEM CD) 120 MG 24 hr capsule Take 1 capsule (120 mg total) by mouth daily. 90 capsule 3  . losartan-hydrochlorothiazide (HYZAAR) 100-12.5 MG tablet Take 1 tablet by mouth daily.      No current facility-administered medications for this visit.     Discontinued Meds:   There are no discontinued medications.  Patient Active Problem List   Diagnosis Date Noted  . Influenza A 11/26/2013  . Cough 11/26/2013  . PAF (paroxysmal atrial fibrillation) ([place]) 11/26/2013  . Essential hypertension, benign     LABS    Component Value Date/Time   NA 138 09/13/2016 1043   NA 138 03/22/2016 1551   NA 140 11/26/2013 1709   K 4.0 09/13/2016 1043   K 3.8 03/22/2016 1551   K 4.4 11/26/2013 1709   CL 104 09/13/2016 1043   CL 104 03/22/2016 1551   CL 100 11/26/2013 1709   CO2 27 09/13/2016 1043   CO2 26 03/22/2016 1551   CO2 27 11/26/2013 1709   GLUCOSE 122 (H) 09/13/2016 1043   GLUCOSE 153 (H) 03/22/2016 1551   GLUCOSE 97 11/26/2013 1709   BUN 14  09/13/2016 1043   BUN 22 (H) 03/22/2016 1551   BUN 17 11/26/2013 1709   CREATININE 0.98 09/13/2016 1043   CREATININE 1.11 (H) 03/22/2016 1551   CREATININE 0.99 11/26/2013 1709   CALCIUM 9.4 09/13/2016 1043   CALCIUM 9.4 03/22/2016 1551   CALCIUM 9.7 11/26/2013 1709   GFRNONAA 57 (L) 09/13/2016 1043   GFRNONAA 49 (L) 03/22/2016 1551   GFRNONAA 57 (L) 11/26/2013 1709   GFRAA >60 09/13/2016 1043   GFRAA 57 (L) 03/22/2016 1551   GFRAA 66 (L) 11/26/2013 1709   CMP     Component Value Date/Time   NA 138 09/13/2016 1043   K 4.0 09/13/2016 1043   CL 104 09/13/2016 1043   CO2 27 09/13/2016 1043   GLUCOSE 122 (H) 09/13/2016 1043   BUN 14 09/13/2016 1043   CREATININE 0.98 09/13/2016 1043   CALCIUM 9.4 09/13/2016 1043   PROT 8.1 11/26/2013 1709   ALBUMIN 4.3 11/26/2013 1709   AST 15 11/26/2013 1709   ALT 11 11/26/2013 1709   ALKPHOS 135 (H) 11/26/2013 1709   BILITOT 0.4 11/26/2013 1709   GFRNONAA 57 (L) 09/13/2016 1043   GFRAA >60 09/13/2016  1043       Component Value Date/Time   WBC 10.6 (H) 09/13/2016 1043   WBC 12.5 (H) 03/22/2016 1551   WBC 7.8 11/26/2013 1705   HGB 13.9 09/13/2016 1906   HGB 14.1 09/13/2016 1043   HGB 13.7 03/22/2016 1551   HCT 42.0 09/13/2016 1906   HCT 43.5 09/13/2016 1043   HCT 42.2 03/22/2016 1551   MCV 86.0 09/13/2016 1043   MCV 88.5 03/22/2016 1551   MCV 89.5 11/26/2013 1705    Lipid Panel  No results found for: CHOL, TRIG, HDL, CHOLHDL, VLDL, LDLCALC, LDLDIRECT  ABG No results found for: PHART, PCO2ART, PO2ART, HCO3, TCO2, ACIDBASEDEF, O2SAT   Lab Results  Component Value Date   TSH 3.534 11/26/2013   BNP (last 3 results) No results for input(s): BNP in the last 8760 hours.  ProBNP (last 3 results) No results for input(s): PROBNP in the last 8760 hours.  Cardiac Panel (last 3 results) No results for input(s): CKTOTAL, CKMB, TROPONINI, RELINDX in the last 72 hours.  Iron/TIBC/Ferritin/ %Sat No results found for: IRON, TIBC,  FERRITIN, IRONPCTSAT   EKG Orders placed or performed in visit on 10/12/16  . EKG 12-Lead     Prior Assessment and Plan Problem List as of 10/12/2016 Reviewed: 10/11/2016  4:12 PM by Florian Buff, MD     Cardiovascular and Mediastinum   Essential hypertension, benign   Last Assessment & Plan 01/29/2014 Office Visit Written 01/29/2014  9:42 AM by Satira Sark, MD    Cough has resolved off ACE inhibitor, she is tolerating Norvasc. Would recommend follow with Dr. [name] at this point for medication adjustments and refills.      PAF (paroxysmal atrial fibrillation) [facility])   Last Assessment & Plan 01/29/2014 Office Visit Written 01/29/2014  9:41 AM by Satira Sark, MD    Single episode noted with influenza A back in January. No recurrence. Would recommend observation for now. She will stay on aspirin at this point. Keep follow with Dr. [name], we can see her back if she has recurring arrhythmias.        Respiratory   Influenza A   Last Assessment & Plan 12/18/2013 Office Visit Written 12/18/2013  3:08 PM by Lendon Colonel, NP    She has recovered well. She denies any flulike symptoms. She will follow with her PCP.        Other   Cough       Imaging: US Transvaginal Non-ob  Result Date: 09/13/2016 CLINICAL DATA:  70 year old female with postmenopausal bleeding and endometrial thickening on CT study from earlier today. EXAM: TRANSABDOMINAL AND TRANSVAGINAL ULTRASOUND OF PELVIS TECHNIQUE: Both transabdominal and transvaginal ultrasound examinations of the pelvis were performed. Transabdominal technique was performed for global imaging of the pelvis including uterus, ovaries, adnexal regions, and pelvic cul-de-sac. It was necessary to proceed with endovaginal exam following the transabdominal exam to visualize the endometrium and adnexa. COMPARISON:  09/13/2016 unenhanced CT abdomen/ pelvis. FINDINGS: Uterus Measurements: 10.1 x 5.2 x 5.1 cm. Uterus is anteverted and mildly enlarged.  There is a solitary subserosal 3.6 x 2.8 x 2.5 cm posterior uterine body fibroid. Endometrium Thickness: 16 mm. Poor endometrial visualization on the transvaginal images due to uterine position. Abnormally thickened heterogeneous endometrium based on the transabdominal images. There is heterogeneous hypoechoic material distending the endocervical canal up to 22 mm thickness, with apparent internal vascularity on color Doppler. Right ovary Nonvisualization of the right ovary.  No right adnexal mass. Left ovary Measurements: 1.8  x 1.1 x 1.3 cm (transabdominal measurements). Normal appearance/no adnexal mass. Other findings No free fluid. IMPRESSION: 1. Abnormally thickened (16 mm) heterogeneous endometrium. In the setting of post-menopausal bleeding, endometrial sampling is indicated to exclude carcinoma. If results are benign, sonohysterogram should be considered for focal lesion work-up. (Ref: Radiological Reasoning: Algorithmic Workup of Abnormal Vaginal Bleeding with Endovaginal Sonography and Sonohysterography. AJR [id number]). 2. Distention of the endocervical canal up to 22 mm thickness by heterogeneous hypoechoic material with internal vascularity, worrisome for endocervical extension of endometrial malignancy and/or blood products. 3. Solitary subserosal posterior uterine body fibroid. 4. Nonvisualization of the right ovary. Normal left ovary. No adnexal masses. 5. No free fluid in the pelvis. Electronically Signed   By: Ilona Sorrel M.D.   On: 09/13/2016 17:18   US Pelvis Complete  Result Date: 09/13/2016 CLINICAL DATA:  70 year old female with postmenopausal bleeding and endometrial thickening on CT study from earlier today. EXAM: TRANSABDOMINAL AND TRANSVAGINAL ULTRASOUND OF PELVIS TECHNIQUE: Both transabdominal and transvaginal ultrasound examinations of the pelvis were performed. Transabdominal technique was performed for global imaging of the pelvis including uterus, ovaries, adnexal regions,  and pelvic cul-de-sac. It was necessary to proceed with endovaginal exam following the transabdominal exam to visualize the endometrium and adnexa. COMPARISON:  09/13/2016 unenhanced CT abdomen/ pelvis. FINDINGS: Uterus Measurements: 10.1 x 5.2 x 5.1 cm. Uterus is anteverted and mildly enlarged. There is a solitary subserosal 3.6 x 2.8 x 2.5 cm posterior uterine body fibroid. Endometrium Thickness: 16 mm. Poor endometrial visualization on the transvaginal images due to uterine position. Abnormally thickened heterogeneous endometrium based on the transabdominal images. There is heterogeneous hypoechoic material distending the endocervical canal up to 22 mm thickness, with apparent internal vascularity on color Doppler. Right ovary Nonvisualization of the right ovary.  No right adnexal mass. Left ovary Measurements: 1.8 x 1.1 x 1.3 cm (transabdominal measurements). Normal appearance/no adnexal mass. Other findings No free fluid. IMPRESSION: 1. Abnormally thickened (16 mm) heterogeneous endometrium. In the setting of post-menopausal bleeding, endometrial sampling is indicated to exclude carcinoma. If results are benign, sonohysterogram should be considered for focal lesion work-up. (Ref: Radiological Reasoning: Algorithmic Workup of Abnormal Vaginal Bleeding with Endovaginal Sonography and Sonohysterography. AJR [id number]). 2. Distention of the endocervical canal up to 22 mm thickness by heterogeneous hypoechoic material with internal vascularity, worrisome for endocervical extension of endometrial malignancy and/or blood products. 3. Solitary subserosal posterior uterine body fibroid. 4. Nonvisualization of the right ovary. Normal left ovary. No adnexal masses. 5. No free fluid in the pelvis. Electronically Signed   By: Ilona Sorrel M.D.   On: 09/13/2016 17:18   Ct Renal Stone Study  Result Date: 09/13/2016 CLINICAL DATA:  Standard/full stone protocol used; Patient complaining of abdominal pain and vaginal  bleeding that started last night; hematuria EXAM: CT ABDOMEN AND PELVIS WITHOUT CONTRAST TECHNIQUE: Multidetector CT imaging of the abdomen and pelvis was performed following the standard protocol without IV contrast. COMPARISON:  06/02/2014 FINDINGS: Lower chest: Minimal bibasilar atelectasis. Trace pericardial effusion. Heart size is normal. Hepatobiliary: Within the posterior segment of the right hepatic lobe there is a 12 mm oval low-attenuation lesion. Within the anterior segment of the right hepatic lobe there is an 11 mm low-attenuation lesion. Small calcified granulomata are identified throughout the liver. Gallbladder is present and normal in CT appearance. Pancreas: Unremarkable. No pancreatic ductal dilatation or surrounding inflammatory changes. Spleen: Normal in size without focal abnormality. Adrenals/Urinary Tract: Adrenal glands are unremarkable. Kidneys are normal, without renal calculi, focal  lesion, or hydronephrosis. Bladder is unremarkable. Stomach/Bowel: The stomach and small bowel loops are normal in appearance. The appendix is well seen and has a normal appearance. Colonic loops are normal in appearance. Vascular/Lymphatic: No evidence for aortic aneurysm. No retroperitoneal or mesenteric adenopathy. Reproductive: Uterus is present. The region of the endometrium measures approximately 13 mm. Question of endometrial thickening. Posterior uterine fibroid measures approximate 3.9 cm. No adnexal mass. Other: There is no free pelvic fluid. Abdominal wall is unremarkable. Musculoskeletal: No acute or significant osseous findings. IMPRESSION: 1. Suspect endometrial thickening. Given the presence of postmenopausal bleeding, further evaluation with pelvic ultrasound is recommended. 2. Posterior uterine fibroid probably stable. 3. No intrarenal or ureteral stones. 4. Normal appendix. 5. Small probably benign hepatic lesions not further characterized. 6. Hepatic granulomata. Electronically Signed   By:  Nolon Nations M.D.   On: 09/13/2016 15:42

## 2016-10-12 NOTE — Patient Instructions (Signed)
Your physician recommends that you schedule a follow-up appointment in: 1 month after your GYN procedure    Your physician recommends that you continue on your current medications as directed. Please refer to the Current Medication list given to you today.     Thank you for choosing [facility] !

## 2016-10-24 DIAGNOSIS — Z0000 Encounter for general adult medical examination without abnormal findings: Secondary | ICD-10-CM | POA: Diagnosis not present

## 2016-10-31 NOTE — Patient Instructions (Signed)
Vanessa Anderson  10/31/2016     @PREFPERIOPPHARMACY @   Your procedure is scheduled on  11/07/2016   Report to [facility] at  830  A.M.  Call this number if you have problems the morning of surgery:  [phone]   Remember:  Do not eat food or drink liquids after midnight.  Take these medicines the morning of surgery with A SIP OF WATER  Cardiazem, losartan.   Do not wear jewelry, make-up or nail polish.  Do not wear lotions, powders, or perfumes, or deoderant.  Do not shave 48 hours prior to surgery.  Men may shave face and neck.  Do not bring valuables to the hospital.  [facility] is not responsible for any belongings or valuables.  Contacts, dentures or bridgework may not be worn into surgery.  Leave your suitcase in the car.  After surgery it may be brought to your room.  For patients admitted to the hospital, discharge time will be determined by your treatment team.  Patients discharged the day of surgery will not be allowed to drive home.   Name and phone number of your driver:   family Special instructions:  none  Please read over the following fact sheets that you were given. Anesthesia Post-op Instructions and Care and Recovery After Surgery      Hysteroscopy Hysteroscopy is a procedure used for looking inside the womb (uterus). It may be done for various reasons, including:  To evaluate abnormal bleeding, fibroid (benign, noncancerous) tumors, polyps, scar tissue (adhesions), and possibly cancer of the uterus.  To look for lumps (tumors) and other uterine growths.  To look for causes of why a woman cannot get pregnant (infertility), causes of recurrent loss of pregnancy (miscarriages), or a lost intrauterine device (IUD).  To perform a sterilization by blocking the fallopian tubes from inside the uterus. In this procedure, a thin, flexible tube with a tiny light and camera on the end of it (hysteroscope) is used to look inside the  uterus. A hysteroscopy should be done right after a menstrual period to be sure you are not pregnant. LET [facility] CARE PROVIDER KNOW ABOUT:   Any allergies you have.  All medicines you are taking, including vitamins, herbs, eye drops, creams, and over-the-counter medicines.  Previous problems you or members of your family have had with the use of anesthetics.  Any blood disorders you have.  Previous surgeries you have had.  Medical conditions you have. RISKS AND COMPLICATIONS  Generally, this is a safe procedure. However, as with any procedure, complications can occur. Possible complications include:  Putting a hole in the uterus.  Excessive bleeding.  Infection.  Damage to the cervix.  Injury to other organs.  Allergic reaction to medicines.  Too much fluid used in the uterus for the procedure. BEFORE THE PROCEDURE   Ask your health care provider about changing or stopping any regular medicines.  Do not take aspirin or blood thinners for 1 week before the procedure, or as directed by your health care provider. These can cause bleeding.  If you smoke, do not smoke for 2 weeks before the procedure.  In some cases, a medicine is placed in the cervix the day before the procedure. This medicine makes the cervix have a larger opening (dilate). This makes it easier for the instrument to be inserted into the uterus during the procedure.  Do not eat or drink  anything for at least 8 hours before the surgery.  Arrange for someone to take you home after the procedure. PROCEDURE   You may be given a medicine to relax you (sedative). You may also be given one of the following:  A medicine that numbs the area around the cervix (local anesthetic).  A medicine that makes you sleep through the procedure (general anesthetic).  The hysteroscope is inserted through the vagina into the uterus. The camera on the hysteroscope sends a picture to a TV screen. This gives the surgeon a  good view inside the uterus.  During the procedure, air or a liquid is put into the uterus, which allows the surgeon to see better.  Sometimes, tissue is gently scraped from inside the uterus. These tissue samples are sent to a lab for testing. AFTER THE PROCEDURE   If you had a general anesthetic, you may be groggy for a couple hours after the procedure.  If you had a local anesthetic, you will be able to go home as soon as you are stable and feel ready.  You may have some cramping. This normally lasts for a couple days.  You may have bleeding, which varies from light spotting for a few days to menstrual-like bleeding for 3-7 days. This is normal.  If your test results are not back during the visit, make an appointment with your health care provider to find out the results. This information is not intended to replace advice given to you by your health care provider. Make sure you discuss any questions you have with your health care provider. Document Released: 01/28/2001 Document Revised: 08/12/2013 Document Reviewed: 05/21/2013 Elsevier Interactive Patient Education  2017 [place]. Hysteroscopy, Care After Refer to this sheet in the next few weeks. These instructions provide you with information on caring for yourself after your procedure. Your health care provider may also give you more specific instructions. Your treatment has been planned according to current medical practices, but problems sometimes occur. Call your health care provider if you have any problems or questions after your procedure.  WHAT TO EXPECT AFTER THE PROCEDURE After your procedure, it is typical to have the following:  You may have some cramping. This normally lasts for a couple days.  You may have bleeding. This can vary from light spotting for a few days to menstrual-like bleeding for 3-7 days. HOME CARE INSTRUCTIONS  Rest for the first 1-2 days after the procedure.  Only take over-the-counter or  prescription medicines as directed by your health care provider. Do not take aspirin. It can increase the chances of bleeding.  Take showers instead of baths for 2 weeks or as directed by your health care provider.  Do not drive for 24 hours or as directed.  Do not drink alcohol while taking pain medicine.  Do not use tampons, douche, or have sexual intercourse for 2 weeks or until your health care provider says it is okay.  Take your temperature twice a day for 4-5 days. Write it down each time.  Follow your health care provider's advice about diet, exercise, and lifting.  If you develop constipation, you may:  Take a mild laxative if your health care provider approves.  Add bran foods to your diet.  Drink enough fluids to keep your urine clear or pale yellow.  Try to have someone with you or available to you for the first 24-48 hours, especially if you were given a general anesthetic.  Follow up with your health care  provider as directed. SEEK MEDICAL CARE IF:  You feel dizzy or lightheaded.  You feel sick to your stomach (nauseous).  You have abnormal vaginal discharge.  You have a rash.  You have pain that is not controlled with medicine. SEEK IMMEDIATE MEDICAL CARE IF:  You have bleeding that is heavier than a normal menstrual period.  You have a fever.  You have increasing cramps or pain, not controlled with medicine.  You have new belly (abdominal) pain.  You pass out.  You have pain in the tops of your shoulders (shoulder strap areas).  You have shortness of breath. This information is not intended to replace advice given to you by your health care provider. Make sure you discuss any questions you have with your health care provider. Document Released: 08/12/2013 Document Reviewed: 08/12/2013 Elsevier Interactive Patient Education  2017 [place].  Dilation and Curettage or Vacuum Curettage Dilation and curettage (D&C) and vacuum curettage are minor  procedures. A D&C involves stretching (dilation) the cervix and scraping (curettage) the inside lining of the uterus (endometrium). During a D&C, tissue is gently scraped from the endometrium, starting from the top portion of the uterus down to the lowest part of the uterus (cervix). During a vacuum curettage, the lining and tissue in the uterus are removed with the use of gentle suction. Curettage may be performed to either diagnose or treat a problem. As a diagnostic procedure, curettage is performed to examine tissues from the uterus. A diagnostic curettage may be done if you have:  Irregular bleeding in the uterus.  Bleeding with the development of clots.  Spotting between menstrual periods.  Prolonged menstrual periods or other abnormal bleeding.  Bleeding after menopause.  No menstrual period (amenorrhea).  A change in size and shape of the uterus.  Abnormal endometrial cells discovered during a Pap test. As a treatment procedure, curettage may be performed for the following reasons:  Removal of an IUD (intrauterine device).  Removal of retained placenta after giving birth.  Abortion.  Miscarriage.  Removal of endometrial polyps.  Removal of uncommon types of noncancerous lumps (fibroids). Tell a health care provider about:  Any allergies you have, including allergies to prescribed medicine or latex.  All medicines you are taking, including vitamins, herbs, eye drops, creams, and over-the-counter medicines. This is especially important if you take any blood-thinning medicine. Bring a list of all of your medicines to your appointment.  Any problems you or family members have had with anesthetic medicines.  Any blood disorders you have.  Any surgeries you have had.  Your medical history and any medical conditions you have.  Whether you are pregnant or may be pregnant.  Recent vaginal infections you have had.  Recent menstrual periods, bleeding problems you have  had, and what form of birth control (contraception) you use. What are the risks? Generally, this is a safe procedure. However, problems may occur, including:  Infection.  Heavy vaginal bleeding.  Allergic reactions to medicines.  Damage to the cervix or other structures or organs.  Development of scar tissue (adhesions) inside the uterus, which can cause abnormal amounts of menstrual bleeding. This may make it harder to get pregnant in the future.  A hole (perforation) or puncture in the uterine wall. This is rare. What happens before the procedure? Staying hydrated  Follow instructions from your health care provider about hydration, which may include:  Up to 2 hours before the procedure - you may continue to drink clear liquids, such as water,  clear fruit juice, black coffee, and plain tea. Eating and drinking restrictions  Follow instructions from your health care provider about eating and drinking, which may include:  8 hours before the procedure - stop eating heavy meals or foods such as meat, fried foods, or fatty foods.  6 hours before the procedure - stop eating light meals or foods, such as toast or cereal.  6 hours before the procedure - stop drinking milk or drinks that contain milk.  2 hours before the procedure - stop drinking clear liquids. If your health care provider told you to take your medicine(s) on the day of your procedure, take them with only a sip of water. Medicines  Ask your health care provider about:  Changing or stopping your regular medicines. This is especially important if you are taking diabetes medicines or blood thinners.  Taking medicines such as aspirin and ibuprofen. These medicines can thin your blood. Do not take these medicines before your procedure if your health care provider instructs you not to.  You may be given antibiotic medicine to help prevent infection. General instructions  For 24 hours before your procedure, do  not:  Douche.  Use tampons.  Use medicines, creams, or suppositories in the vagina.  Have sexual intercourse.  You may be given a pregnancy test on the day of the procedure.  Plan to have someone take you home from the hospital or clinic.  You may have a blood or urine sample taken.  If you will be going home right after the procedure, plan to have someone with you for 24 hours. What happens during the procedure?  To reduce your risk of infection:  Your health care team will wash or sanitize their hands.  Your skin will be washed with soap.  An IV tube will be inserted into one of your veins.  You will be given one of the following:  A medicine that numbs the area in and around the cervix (local anesthetic).  A medicine to make you fall asleep (general anesthetic).  You will lie down on your back, with your feet in foot rests (stirrups).  The size and position of your uterus will be checked.  A lubricated instrument (speculum or Sims retractor) will be inserted into the back side of your vagina. The speculum will be used to hold apart the walls of your vagina so your health care provider can see your cervix.  A tool (tenaculum) will be attached to the lip of the cervix to stabilize it.  Your cervix will be softened and dilated. This may be done by:  Taking a medicine.  Having tapered dilators or thin rods (laminaria) or gradual widening instruments (tapered dilators) inserted into your cervix.  A small, sharp, curved instrument (curette) will be used to scrape a small amount of tissue or cells from the endometrium or cervical canal. In some cases, gentle suction is applied with the curette. The curette will then be removed. The cells will be taken to a lab for testing. The procedure may vary among health care providers and hospitals. What happens after the procedure?  You may have mild cramping, backache, pain, and light bleeding or spotting. You may pass small  blood clots from your vagina.  You may have to wear compression stockings. These stockings help to prevent blood clots and reduce swelling in your legs.  Your blood pressure, heart rate, breathing rate, and blood oxygen level will be monitored until the medicines you were given have worn  off. Summary  Dilation and curettage (D&C) involves stretching (dilation) the cervix and scraping (curettage) the inside lining of the uterus (endometrium).  After the procedure, you may have mild cramping, backache, pain, and light bleeding or spotting. You may pass small blood clots from your vagina.  Plan to have someone take you home from the hospital or clinic. This information is not intended to replace advice given to you by your health care provider. Make sure you discuss any questions you have with your health care provider. Document Released: 10/22/2005 Document Revised: 07/08/2016 Document Reviewed: 07/08/2016 Elsevier Interactive Patient Education  2017 [employer]  Dilation and Curettage or Vacuum Curettage, Care After These instructions give you information about caring for yourself after your procedure. Your doctor may also give you more specific instructions. Call your doctor if you have any problems or questions after your procedure. Follow these instructions at home: Activity  Do not drive or use heavy machinery while taking prescription pain medicine.  For 24 hours after your procedure, avoid driving.  Take short walks often, followed by rest periods. Ask your doctor what activities are safe for you. After one or two days, you may be able to return to your normal activities.  Do not lift anything that is heavier than 10 lb (4.5 kg) until your doctor approves.  For at least 2 weeks, or as long as told by your doctor:  Do not douche.  Do not use tampons.  Do not have sex. General instructions  Take over-the-counter and prescription medicines only as told by your doctor. This  is very important if you take blood thinning medicine.  Do not take baths, swim, or use a hot tub until your doctor approves. Take showers instead of baths.  Wear compression stockings as told by your doctor.  It is up to you to get the results of your procedure. Ask your doctor when your results will be ready.  Keep all follow-up visits as told by your doctor. This is important. Contact a doctor if:  You have very bad cramps that get worse or do not get better with medicine.  You have very bad pain in your belly (abdomen).  You cannot drink fluids without throwing up (vomiting).  You get pain in a different part of the area between your belly and thighs (pelvis).  You have bad-smelling discharge from your vagina.  You have a rash. Get help right away if:  You are bleeding a lot from your vagina. A lot of bleeding means soaking more than one sanitary pad in an hour, for 2 hours in a row.  You have clumps of blood (blood clots) coming from your vagina.  You have a fever or chills.  Your belly feels very tender or hard.  You have chest pain.  You have trouble breathing.  You cough up blood.  You feel dizzy.  You feel light-headed.  You pass out (faint).  You have pain in your neck or shoulder area. Summary  Take short walks often, followed by rest periods. Ask your doctor what activities are safe for you. After one or two days, you may be able to return to your normal activities.  Do not lift anything that is heavier than 10 lb (4.5 kg) until your doctor approves.  Do not take baths, swim, or use a hot tub until your doctor approves. Take showers instead of baths.  Contact your doctor if you have any symptoms of infection, like bad-smelling discharge from your vagina.  This information is not intended to replace advice given to you by your health care provider. Make sure you discuss any questions you have with your health care provider. Document Released:  07/31/2008 Document Revised: 07/09/2016 Document Reviewed: 07/09/2016 Elsevier Interactive Patient Education  2017 [place] Anesthesia, Adult General anesthesia is the use of medicines to make a person "go to sleep" (be unconscious) for a medical procedure. General anesthesia is often recommended when a procedure:  Is long.  Requires you to be still or in an unusual position.  Is major and can cause you to lose blood.  Is impossible to do without general anesthesia. The medicines used for general anesthesia are called general anesthetics. In addition to making you sleep, the medicines:  Prevent pain.  Control your blood pressure.  Relax your muscles. Tell a health care provider about:  Any allergies you have.  All medicines you are taking, including vitamins, herbs, eye drops, creams, and over-the-counter medicines.  Any problems you or family members have had with anesthetic medicines.  Types of anesthetics you have had in the past.  Any bleeding disorders you have.  Any surgeries you have had.  Any medical conditions you have.  Any history of heart or lung conditions, such as heart failure, sleep apnea, or chronic obstructive pulmonary disease (COPD).  Whether you are pregnant or may be pregnant.  Whether you use tobacco, alcohol, marijuana, or street drugs.  Any history of Armed forces logistics/support/administrative officer.  Any history of depression or anxiety. What are the risks? Generally, this is a safe procedure. However, problems may occur, including:  Allergic reaction to anesthetics.  Lung and heart problems.  Inhaling food or liquids from your stomach into your lungs (aspiration).  Injury to nerves.  Waking up during your procedure and being unable to move (rare).  Extreme agitation or a state of mental confusion (delirium) when you wake up from the anesthetic.  Air in the bloodstream, which can lead to stroke. These problems are more likely to develop if you are  having a major surgery or if you have an advanced medical condition. You can prevent some of these complications by answering all of your health care provider's questions thoroughly and by following all pre-procedure instructions. General anesthesia can cause side effects, including:  Nausea or vomiting  A sore throat from the breathing tube.  Feeling cold or shivery.  Feeling tired, washed out, or achy.  Sleepiness or drowsiness.  Confusion or agitation. What happens before the procedure? Staying hydrated  Follow instructions from your health care provider about hydration, which may include:  Up to 2 hours before the procedure - you may continue to drink clear liquids, such as water, clear fruit juice, black coffee, and plain tea. Eating and drinking restrictions  Follow instructions from your health care provider about eating and drinking, which may include:  8 hours before the procedure - stop eating heavy meals or foods such as meat, fried foods, or fatty foods.  6 hours before the procedure - stop eating light meals or foods, such as toast or cereal.  6 hours before the procedure - stop drinking milk or drinks that contain milk.  2 hours before the procedure - stop drinking clear liquids. Medicines  Ask your health care provider about:  Changing or stopping your regular medicines. This is especially important if you are taking diabetes medicines or blood thinners.  Taking medicines such as aspirin and ibuprofen. These medicines can thin your blood. Do not take  these medicines before your procedure if your health care provider instructs you not to.  Taking new dietary supplements or medicines. Do not take these during the week before your procedure unless your health care provider approves them.  If you are told to take a medicine or to continue taking a medicine on the day of the procedure, take the medicine with sips of water. General instructions   Ask if you will be  going home the same day, the following day, or after a longer hospital stay.  Plan to have someone take you home.  Plan to have someone stay with you for the first 24 hours after you leave the hospital or clinic.  For 3-6 weeks before the procedure, try not to use any tobacco products, such as cigarettes, chewing tobacco, and e-cigarettes.  You may brush your teeth on the morning of the procedure, but make sure to spit out the toothpaste. What happens during the procedure?  You will be given anesthetics through a mask and through an IV tube in one of your veins.  You may receive medicine to help you relax (sedative).  As soon as you are asleep, a breathing tube may be used to help you breathe.  An anesthesia specialist will stay with you throughout the procedure. He or she will help keep you comfortable and safe by continuing to give you medicines and adjusting the amount of medicine that you get. He or she will also watch your blood pressure, pulse, and oxygen levels to make sure that the anesthetics do not cause any problems.  If a breathing tube was used to help you breathe, it will be removed before you wake up. The procedure may vary among health care providers and hospitals. What happens after the procedure?  You will wake up, often slowly, after the procedure is complete, usually in a recovery area.  Your blood pressure, heart rate, breathing rate, and blood oxygen level will be monitored until the medicines you were given have worn off.  You may be given medicine to help you calm down if you feel anxious or agitated.  If you will be going home the same day, your health care provider may check to make sure you can stand, drink, and urinate.  Your health care providers will treat your pain and side effects before you go home.  Do not drive for 24 hours if you received a sedative.  You may:  Feel nauseous and vomit.  Have a sore throat.  Have mental slowness.  Feel  cold or shivery.  Feel sleepy.  Feel tired.  Feel sore or achy, even in parts of your body where you did not have surgery. This information is not intended to replace advice given to you by your health care provider. Make sure you discuss any questions you have with your health care provider. Document Released: 01/29/2008 Document Revised: 04/03/2016 Document Reviewed: 10/06/2015 Elsevier Interactive Patient Education  2017 [place] Anesthesia, Adult, Care After These instructions provide you with information about caring for yourself after your procedure. Your health care provider may also give you more specific instructions. Your treatment has been planned according to current medical practices, but problems sometimes occur. Call your health care provider if you have any problems or questions after your procedure. What can I expect after the procedure? After the procedure, it is common to have:  Vomiting.  A sore throat.  Mental slowness. It is common to feel:  Nauseous.  Cold or shivery.  Sleepy.  Tired.  Sore or achy, even in parts of your body where you did not have surgery. Follow these instructions at home: For at least 24 hours after the procedure:  Do not:  Participate in activities where you could fall or become injured.  Drive.  Use heavy machinery.  Drink alcohol.  Take sleeping pills or medicines that cause drowsiness.  Make important decisions or sign legal documents.  Take care of children on your own.  Rest. Eating and drinking  If you vomit, drink water, juice, or soup when you can drink without vomiting.  Drink enough fluid to keep your urine clear or pale yellow.  Make sure you have little or no nausea before eating solid foods.  Follow the diet recommended by your health care provider. General instructions  Have a responsible adult stay with you until you are awake and alert.  Return to your normal activities as told by  your health care provider. Ask your health care provider what activities are safe for you.  Take over-the-counter and prescription medicines only as told by your health care provider.  If you smoke, do not smoke without supervision.  Keep all follow-up visits as told by your health care provider. This is important. Contact a health care provider if:  You continue to have nausea or vomiting at home, and medicines are not helpful.  You cannot drink fluids or start eating again.  You cannot urinate after 8-12 hours.  You develop a skin rash.  You have fever.  You have increasing redness at the site of your procedure. Get help right away if:  You have difficulty breathing.  You have chest pain.  You have unexpected bleeding.  You feel that you are having a life-threatening or urgent problem. This information is not intended to replace advice given to you by your health care provider. Make sure you discuss any questions you have with your health care provider. Document Released: 01/28/2001 Document Revised: 03/26/2016 Document Reviewed: 10/06/2015 Elsevier Interactive Patient Education  2017 [employer].

## 2016-11-02 ENCOUNTER — Encounter (HOSPITAL_COMMUNITY)
Admission: RE | Admit: 2016-11-02 | Discharge: 2016-11-02 | Disposition: A | Discharge status: Home / Self Care | Payer: Medicare Other | Source: Ambulatory Visit | Attending: Obstetrics & Gynecology | Admitting: Obstetrics & Gynecology

## 2016-11-02 ENCOUNTER — Encounter (HOSPITAL_COMMUNITY): Payer: Self-pay

## 2016-11-02 DIAGNOSIS — Z01812 Encounter for preprocedural laboratory examination: Secondary | ICD-10-CM | POA: Insufficient documentation

## 2016-11-02 LAB — COMPREHENSIVE METABOLIC PANEL
ALBUMIN: 4.2 g/dL (ref 3.5–5.0)
ALT: 20 U/L (ref 14–54)
ANION GAP: 7 (ref 5–15)
AST: 19 U/L (ref 15–41)
Alkaline Phosphatase: 120 U/L (ref 38–126)
BILIRUBIN TOTAL: 0.9 mg/dL (ref 0.3–1.2)
BUN: 16 mg/dL (ref 6–20)
CO2: 26 mmol/L (ref 22–32)
Calcium: 9.1 mg/dL (ref 8.9–10.3)
Chloride: 102 mmol/L (ref 101–111)
Creatinine, Ser: 0.87 mg/dL (ref 0.44–1.00)
GFR calc Af Amer: >60 mL/min (ref >60)
GFR calc non Af Amer: >60 mL/min (ref >60)
GLUCOSE: 84 mg/dL (ref 65–99)
POTASSIUM: 3.5 mmol/L (ref 3.5–5.1)
SODIUM: 135 mmol/L (ref 135–145)
TOTAL PROTEIN: 7.6 g/dL (ref 6.5–8.1)

## 2016-11-02 LAB — URINALYSIS, ROUTINE W REFLEX MICROSCOPIC
BILIRUBIN URINE: NEGATIVE
Bacteria, UA: NONE SEEN
GLUCOSE, UA: NEGATIVE mg/dL
Hgb urine dipstick: NEGATIVE
KETONES UR: NEGATIVE mg/dL
NITRITE: NEGATIVE
PH: 7 (ref 5.0–8.0)
PROTEIN: NEGATIVE mg/dL
Specific Gravity, Urine: 1.013 (ref 1.005–1.030)

## 2016-11-02 LAB — CBC
HEMATOCRIT: 39.7 % (ref 36.0–46.0)
HEMOGLOBIN: 12.7 g/dL (ref 12.0–15.0)
MCH: 28.5 pg (ref 26.0–34.0)
MCHC: 32 g/dL (ref 30.0–36.0)
MCV: 89 fL (ref 78.0–100.0)
Platelets: 242 10*3/uL (ref 150–400)
RBC: 4.46 MIL/uL (ref 3.87–5.11)
RDW: 15 % (ref 11.5–15.5)
WBC: 9.4 10*3/uL (ref 4.0–10.5)

## 2016-11-02 LAB — TYPE AND SCREEN
ABO/RH(D): O POS
ANTIBODY SCREEN: NEGATIVE

## 2016-11-07 ENCOUNTER — Ambulatory Visit (HOSPITAL_COMMUNITY): Payer: Medicare Other | Admitting: Anesthesiology

## 2016-11-07 ENCOUNTER — Encounter (HOSPITAL_COMMUNITY)
Admission: RE | Disposition: A | Discharge status: Home / Self Care | Payer: Self-pay | Source: Ambulatory Visit | Attending: Obstetrics & Gynecology

## 2016-11-07 ENCOUNTER — Ambulatory Visit (HOSPITAL_COMMUNITY)
Admission: RE | Admit: 2016-11-07 | Discharge: 2016-11-07 | Disposition: A | Discharge status: Home / Self Care | Payer: Medicare Other | Source: Ambulatory Visit | Attending: Obstetrics & Gynecology | Admitting: Obstetrics & Gynecology

## 2016-11-07 ENCOUNTER — Encounter (HOSPITAL_COMMUNITY): Payer: Self-pay | Admitting: *Deleted

## 2016-11-07 DIAGNOSIS — D250 Submucous leiomyoma of uterus: Secondary | ICD-10-CM | POA: Diagnosis not present

## 2016-11-07 DIAGNOSIS — I480 Paroxysmal atrial fibrillation: Secondary | ICD-10-CM | POA: Diagnosis not present

## 2016-11-07 DIAGNOSIS — R938 Abnormal findings on diagnostic imaging of other specified body structures: Secondary | ICD-10-CM | POA: Insufficient documentation

## 2016-11-07 DIAGNOSIS — M797 Fibromyalgia: Secondary | ICD-10-CM | POA: Insufficient documentation

## 2016-11-07 DIAGNOSIS — N950 Postmenopausal bleeding: Secondary | ICD-10-CM | POA: Insufficient documentation

## 2016-11-07 DIAGNOSIS — N840 Polyp of corpus uteri: Secondary | ICD-10-CM | POA: Diagnosis not present

## 2016-11-07 DIAGNOSIS — I10 Essential (primary) hypertension: Secondary | ICD-10-CM | POA: Insufficient documentation

## 2016-11-07 HISTORY — PX: HYSTEROSCOPY WITH D & C: SHX1775

## 2016-11-07 SURGERY — DILATATION AND CURETTAGE /HYSTEROSCOPY
Anesthesia: General | Site: Uterus

## 2016-11-07 MED ORDER — ONDANSETRON HCL 4 MG/2ML IJ SOLN
INTRAMUSCULAR | Status: AC
  Filled 2016-11-07: qty 2

## 2016-11-07 MED ORDER — PROPOFOL 10 MG/ML IV BOLUS
INTRAVENOUS | Status: DC | PRN
  Administered 2016-11-07: 110 mg via INTRAVENOUS

## 2016-11-07 MED ORDER — FENTANYL CITRATE (PF) 100 MCG/2ML IJ SOLN
INTRAMUSCULAR | Status: AC
  Filled 2016-11-07: qty 2

## 2016-11-07 MED ORDER — SODIUM CHLORIDE 0.9 % IJ SOLN
INTRAMUSCULAR | Status: AC
  Filled 2016-11-07: qty 10

## 2016-11-07 MED ORDER — LACTATED RINGERS IV SOLN
INTRAVENOUS | Status: DC | PRN
  Administered 2016-11-07: 09:00:00 via INTRAVENOUS

## 2016-11-07 MED ORDER — ONDANSETRON HCL 4 MG/2ML IJ SOLN
4.0000 mg | Freq: Once | INTRAMUSCULAR | Status: AC
  Administered 2016-11-07: 4 mg via INTRAVENOUS

## 2016-11-07 MED ORDER — MIDAZOLAM HCL 2 MG/2ML IJ SOLN
INTRAMUSCULAR | Status: AC
Start: 2016-11-07 — End: 2016-11-07
  Filled 2016-11-07: qty 2

## 2016-11-07 MED ORDER — KETOROLAC TROMETHAMINE 10 MG PO TABS: 10.0000 mg | ORAL_TABLET | Freq: Three times a day (TID) | ORAL | 0 refills | Status: DC | PRN

## 2016-11-07 MED ORDER — SODIUM CHLORIDE 0.9 % IR SOLN
Status: DC | PRN
  Administered 2016-11-07: 3000 mL

## 2016-11-07 MED ORDER — FENTANYL CITRATE (PF) 100 MCG/2ML IJ SOLN
INTRAMUSCULAR | Status: DC | PRN
  Administered 2016-11-07: 50 ug via INTRAVENOUS

## 2016-11-07 MED ORDER — LIDOCAINE HCL (PF) 1 % IJ SOLN
INTRAMUSCULAR | Status: AC
  Filled 2016-11-07: qty 5

## 2016-11-07 MED ORDER — ARTIFICIAL TEARS OP OINT
TOPICAL_OINTMENT | OPHTHALMIC | Status: DC | PRN
  Administered 2016-11-07: 1 via OPHTHALMIC

## 2016-11-07 MED ORDER — FENTANYL CITRATE (PF) 100 MCG/2ML IJ SOLN: 25.0000 ug | INTRAMUSCULAR | Status: DC | PRN

## 2016-11-07 MED ORDER — PHENYLEPHRINE 40 MCG/ML (10ML) SYRINGE FOR IV PUSH (FOR BLOOD PRESSURE SUPPORT)
PREFILLED_SYRINGE | INTRAVENOUS | Status: AC
  Filled 2016-11-07: qty 10

## 2016-11-07 MED ORDER — HYDROCODONE-ACETAMINOPHEN 5-325 MG PO TABS: 1.0000 | ORAL_TABLET | Freq: Four times a day (QID) | ORAL | 0 refills | Status: DC | PRN

## 2016-11-07 MED ORDER — LACTATED RINGERS IV SOLN
INTRAVENOUS | Status: DC
  Administered 2016-11-07: 09:00:00 via INTRAVENOUS

## 2016-11-07 MED ORDER — MIDAZOLAM HCL 2 MG/2ML IJ SOLN
1.0000 mg | INTRAMUSCULAR | Status: DC | PRN
  Administered 2016-11-07: 2 mg via INTRAVENOUS

## 2016-11-07 MED ORDER — CEFAZOLIN SODIUM-DEXTROSE 2-4 GM/100ML-% IV SOLN
2.0000 g | INTRAVENOUS | Status: AC
  Administered 2016-11-07: 2 g via INTRAVENOUS
  Filled 2016-11-07: qty 100

## 2016-11-07 MED ORDER — EPHEDRINE SULFATE 50 MG/ML IJ SOLN
INTRAMUSCULAR | Status: AC
  Filled 2016-11-07: qty 1

## 2016-11-07 MED ORDER — ONDANSETRON HCL 8 MG PO TABS: 8.0000 mg | ORAL_TABLET | Freq: Three times a day (TID) | ORAL | 0 refills | Status: DC | PRN

## 2016-11-07 MED ORDER — FENTANYL CITRATE (PF) 100 MCG/2ML IJ SOLN
25.0000 ug | INTRAMUSCULAR | Status: DC | PRN
  Administered 2016-11-07: 25 ug via INTRAVENOUS

## 2016-11-07 MED ORDER — SUCCINYLCHOLINE CHLORIDE 20 MG/ML IJ SOLN
INTRAMUSCULAR | Status: AC
  Filled 2016-11-07: qty 1

## 2016-11-07 MED ORDER — KETOROLAC TROMETHAMINE 30 MG/ML IJ SOLN
30.0000 mg | Freq: Once | INTRAMUSCULAR | Status: AC
  Administered 2016-11-07: 30 mg via INTRAVENOUS
  Filled 2016-11-07: qty 1

## 2016-11-07 SURGICAL SUPPLY — 31 items
BAG HAMPER (MISCELLANEOUS) ×3 IMPLANT
CATH ROBINSON RED A/P 16FR (CATHETERS) ×2 IMPLANT
CLOTH BEACON ORANGE TIMEOUT ST (SAFETY) ×3 IMPLANT
COVER LIGHT HANDLE STERIS (MISCELLANEOUS) ×6 IMPLANT
ELECT BLADE 6 FLAT ULTRCLN (ELECTRODE) ×2 IMPLANT
ELECT REM PT RETURN 9FT ADLT (ELECTROSURGICAL) ×3
ELECTRODE REM PT RTRN 9FT ADLT (ELECTROSURGICAL) ×1 IMPLANT
FORMALIN 10 PREFIL 120ML (MISCELLANEOUS) ×3 IMPLANT
GAUZE SPONGE 4X4 16PLY XRAY LF (GAUZE/BANDAGES/DRESSINGS) ×3 IMPLANT
GLOVE BIOGEL PI IND STRL 7.0 (GLOVE) ×2 IMPLANT
GLOVE BIOGEL PI IND STRL 8 (GLOVE) ×2 IMPLANT
GLOVE BIOGEL PI INDICATOR 7.0 (GLOVE) ×1
GLOVE BIOGEL PI INDICATOR 8 (GLOVE) ×1
GLOVE ECLIPSE 6.5 STRL STRAW (GLOVE) ×2 IMPLANT
GLOVE ECLIPSE 8.0 STRL XLNG CF (GLOVE) ×3 IMPLANT
GLOVE EXAM NITRILE MD LF STRL (GLOVE) ×2 IMPLANT
GOWN STRL REUS W/TWL LRG LVL3 (GOWN DISPOSABLE) ×4 IMPLANT
GOWN STRL REUS W/TWL XL LVL3 (GOWN DISPOSABLE) ×3 IMPLANT
INST SET HYSTEROSCOPY (KITS) ×3 IMPLANT
KIT ROOM TURNOVER AP CYSTO (KITS) ×3 IMPLANT
MANIFOLD NEPTUNE II (INSTRUMENTS) ×3 IMPLANT
NS IRRIG 1000ML POUR BTL (IV SOLUTION) ×3 IMPLANT
PACK BASIC III (CUSTOM PROCEDURE TRAY) ×3
PACK SRG BSC III STRL LF ECLPS (CUSTOM PROCEDURE TRAY) ×2 IMPLANT
PAD ARMBOARD 7.5X6 YLW CONV (MISCELLANEOUS) ×3 IMPLANT
PAD TELFA 3X4 1S STER (GAUZE/BANDAGES/DRESSINGS) ×5 IMPLANT
PENCIL HANDSWITCHING (ELECTRODE) ×2 IMPLANT
SET IRRIG Y TYPE TUR BLADDER L (SET/KITS/TRAYS/PACK) ×3 IMPLANT
SHEET LAVH (DRAPES) ×3 IMPLANT
TOWEL OR 17X26 4PK STRL BLUE (TOWEL DISPOSABLE) ×3 IMPLANT
YANKAUER SUCT BULB TIP 10FT TU (MISCELLANEOUS) ×3 IMPLANT

## 2016-11-07 NOTE — Transfer of Care (Signed)
Immediate Anesthesia Transfer of Care Note  Patient: Vanessa Anderson  Procedure(s) Performed: Procedure(s): DILATATION AND CURETTAGE /HYSTEROSCOPY (N/A)  Patient Location: PACU  Anesthesia Type:General  Level of Consciousness: awake and patient cooperative  Airway & Oxygen Therapy: Patient Spontanous Breathing and Patient connected to face mask oxygen  Post-op Assessment: Report given to RN, Post -op Vital signs reviewed and stable and Patient moving all extremities  Post vital signs: Reviewed and stable  Last Vitals:  Vitals:   11/07/16 0915 11/07/16 0920  BP: (!) 154/66 (!) 146/61  Resp: 16 15  Temp:      Last Pain:  Vitals:   11/07/16 0815  TempSrc: Oral      Patients Stated Pain Goal: 7 ([id number])  Complications: No apparent anesthesia complications

## 2016-11-07 NOTE — Anesthesia Procedure Notes (Signed)
Procedure Name: LMA Insertion Date/Time: 11/07/2016 10:18 AM Performed by: Andree Elk, AMY A Pre-anesthesia Checklist: Patient identified, Timeout performed, Emergency Drugs available, Suction available and Patient being monitored Patient Re-evaluated:Patient Re-evaluated prior to inductionOxygen Delivery Method: Circle system utilized Preoxygenation: Pre-oxygenation with 100% oxygen Intubation Type: IV induction Ventilation: Mask ventilation without difficulty LMA: LMA inserted LMA Size: 4.0 Number of attempts: 1 Placement Confirmation: positive ETCO2 and breath sounds checked- equal and bilateral Tube secured with: Tape Dental Injury: Teeth and Oropharynx as per pre-operative assessment

## 2016-11-07 NOTE — Discharge Instructions (Signed)
Dilation and Curettage or Vacuum Curettage Dilation and curettage (D&C) and vacuum curettage are minor procedures. A D&C involves stretching (dilation) the cervix and scraping (curettage) the inside lining of the uterus (endometrium). During a D&C, tissue is gently scraped from the endometrium, starting from the top portion of the uterus down to the lowest part of the uterus (cervix). During a vacuum curettage, the lining and tissue in the uterus are removed with the use of gentle suction. Curettage may be performed to either diagnose or treat a problem. As a diagnostic procedure, curettage is performed to examine tissues from the uterus. A diagnostic curettage may be done if you have:  Irregular bleeding in the uterus.  Bleeding with the development of clots.  Spotting between menstrual periods.  Prolonged menstrual periods or other abnormal bleeding.  Bleeding after menopause.  No menstrual period (amenorrhea).  A change in size and shape of the uterus.  Abnormal endometrial cells discovered during a Pap test. As a treatment procedure, curettage may be performed for the following reasons:  Removal of an IUD (intrauterine device).  Removal of retained placenta after giving birth.  Abortion.  Miscarriage.  Removal of endometrial polyps.  Removal of uncommon types of noncancerous lumps (fibroids). Tell a health care provider about:  Any allergies you have, including allergies to prescribed medicine or latex.  All medicines you are taking, including vitamins, herbs, eye drops, creams, and over-the-counter medicines. This is especially important if you take any blood-thinning medicine. Bring a list of all of your medicines to your appointment.  Any problems you or family members have had with anesthetic medicines.  Any blood disorders you have.  Any surgeries you have had.  Your medical history and any medical conditions you have.  Whether you are pregnant or may be  pregnant.  Recent vaginal infections you have had.  Recent menstrual periods, bleeding problems you have had, and what form of birth control (contraception) you use. What are the risks? Generally, this is a safe procedure. However, problems may occur, including:  Infection.  Heavy vaginal bleeding.  Allergic reactions to medicines.  Damage to the cervix or other structures or organs.  Development of scar tissue (adhesions) inside the uterus, which can cause abnormal amounts of menstrual bleeding. This may make it harder to get pregnant in the future.  A hole (perforation) or puncture in the uterine wall. This is rare. What happens before the procedure? Staying hydrated  Follow instructions from your health care provider about hydration, which may include:  Up to 2 hours before the procedure - you may continue to drink clear liquids, such as water, clear fruit juice, black coffee, and plain tea. Eating and drinking restrictions  Follow instructions from your health care provider about eating and drinking, which may include:  8 hours before the procedure - stop eating heavy meals or foods such as meat, fried foods, or fatty foods.  6 hours before the procedure - stop eating light meals or foods, such as toast or cereal.  6 hours before the procedure - stop drinking milk or drinks that contain milk.  2 hours before the procedure - stop drinking clear liquids. If your health care provider told you to take your medicine(s) on the day of your procedure, take them with only a sip of water. Medicines   Ask your health care provider about:  Changing or stopping your regular medicines. This is especially important if you are taking diabetes medicines or blood thinners.  Taking medicines such   as aspirin and ibuprofen. These medicines can thin your blood. Do not take these medicines before your procedure if your health care provider instructs you not to.  You may be given antibiotic  medicine to help prevent infection. General instructions   For 24 hours before your procedure, do not:  Douche.  Use tampons.  Use medicines, creams, or suppositories in the vagina.  Have sexual intercourse.  You may be given a pregnancy test on the day of the procedure.  Plan to have someone take you home from the hospital or clinic.  You may have a blood or urine sample taken.  If you will be going home right after the procedure, plan to have someone with you for 24 hours. What happens during the procedure?  To reduce your risk of infection:  Your health care team will wash or sanitize their hands.  Your skin will be washed with soap.  An IV tube will be inserted into one of your veins.  You will be given one of the following:  A medicine that numbs the area in and around the cervix (local anesthetic).  A medicine to make you fall asleep (general anesthetic).  You will lie down on your back, with your feet in foot rests (stirrups).  The size and position of your uterus will be checked.  A lubricated instrument (speculum or Sims retractor) will be inserted into the back side of your vagina. The speculum will be used to hold apart the walls of your vagina so your health care provider can see your cervix.  A tool (tenaculum) will be attached to the lip of the cervix to stabilize it.  Your cervix will be softened and dilated. This may be done by:  Taking a medicine.  Having tapered dilators or thin rods (laminaria) or gradual widening instruments (tapered dilators) inserted into your cervix.  A small, sharp, curved instrument (curette) will be used to scrape a small amount of tissue or cells from the endometrium or cervical canal. In some cases, gentle suction is applied with the curette. The curette will then be removed. The cells will be taken to a lab for testing. The procedure may vary among health care providers and hospitals. What happens after the  procedure?  You may have mild cramping, backache, pain, and light bleeding or spotting. You may pass small blood clots from your vagina.  You may have to wear compression stockings. These stockings help to prevent blood clots and reduce swelling in your legs.  Your blood pressure, heart rate, breathing rate, and blood oxygen level will be monitored until the medicines you were given have worn off. Summary  Dilation and curettage (D&C) involves stretching (dilation) the cervix and scraping (curettage) the inside lining of the uterus (endometrium).  After the procedure, you may have mild cramping, backache, pain, and light bleeding or spotting. You may pass small blood clots from your vagina.  Plan to have someone take you home from the hospital or clinic. This information is not intended to replace advice given to you by your health care provider. Make sure you discuss any questions you have with your health care provider. Document Released: 10/22/2005 Document Revised: 07/08/2016 Document Reviewed: 07/08/2016 Elsevier Interactive Patient Education  2017 [employer]  

## 2016-11-07 NOTE — Anesthesia Postprocedure Evaluation (Signed)
Anesthesia Post Note  Patient: Vanessa Anderson  Procedure(s) Performed: Procedure(s) (LRB): DILATATION AND CURETTAGE /HYSTEROSCOPY (N/A)  Patient location during evaluation: PACU Anesthesia Type: General Level of consciousness: awake, oriented and patient cooperative Pain management: pain level controlled Vital Signs Assessment: post-procedure vital signs reviewed and stable Respiratory status: nonlabored ventilation, spontaneous breathing and respiratory function stable Cardiovascular status: blood pressure returned to baseline Postop Assessment: no signs of nausea or vomiting Anesthetic complications: no     Last Vitals:  Vitals:   11/07/16 1115 11/07/16 1130  BP: (!) 115/56   Pulse: (!) 49 (!) 42  Resp: 12 13  Temp: 36.4 C     Last Pain:  Vitals:   11/07/16 0815  TempSrc: Oral                 Camay Pedigo J

## 2016-11-07 NOTE — H&P (Signed)
Preoperative History and Physical  Vanessa Anderson is a 71 y.o. No obstetric history on file. with No LMP recorded. Patient is postmenopausal. admitted for a hysteroscopy and uterine curettage.  Vanessa Anderson is a 71 y.o. No obstetric history on file. with No LMP recorded. Patient is postmenopausal. admitted for a hysteroscopy uterine curettage removal of endometrial mass. Office endometrial biopsy did not return anything but blood clot.  The pateitn did not really want to proceed with hysteroscopy uterine curettage but finally decides she will but wants to do it after the holidays.  It si scheduled for 11/07/2016  PMH:    Past Medical History:  Diagnosis Date  . Atrial fibrillation ([place])    In the setting of influenza  . Essential hypertension, benign   . Fibromyalgia     PSH:     Past Surgical History:  Procedure Laterality Date  . CESAREAN SECTION    . COLONOSCOPY N/A 05/28/2014   Procedure: COLONOSCOPY;  Surgeon: Danie Binder, MD;  Location: [facility];  Service: Endoscopy;  Laterality: N/A;  8:30 AM    POb/GynH:      OB History    No data available      SH:   Social History  Substance Use Topics  . Smoking status: Never Smoker  . Smokeless tobacco: Never Used  . Alcohol use No    FH:   History reviewed. No pertinent family history.   Allergies: No Known Allergies  Medications:       Current Facility-Administered Medications:  .  ceFAZolin (ANCEF) IVPB 2g/100 mL premix, 2 g, Intravenous, On Call to OR, Florian Buff, MD .  fentaNYL (SUBLIMAZE) injection 25 mcg, 25 mcg, Intravenous, Q5 min PRN, Lerry Liner, MD, 25 mcg at 11/07/16 [phone] .  lactated ringers infusion, , Intravenous, Continuous, Lerry Liner, MD, Last Rate: 50 mL/hr at 11/07/16 [id number] .  midazolam (VERSED) injection 1-2 mg, 1-2 mg, Intravenous, Q5 min PRN, Lerry Liner, MD, 2 mg at 11/07/16 [id number]  Facility-Administered Medications Ordered in Other Encounters:  .  lactated ringers infusion, , ,  Continuous PRN, Amy A Adams, CRNA  Review of Systems:   Review of Systems  Constitutional: Negative for fever, chills, weight loss, malaise/fatigue and diaphoresis.  HENT: Negative for hearing loss, ear pain, nosebleeds, congestion, sore throat, neck pain, tinnitus and ear discharge.   Eyes: Negative for blurred vision, double vision, photophobia, pain, discharge and redness.  Respiratory: Negative for cough, hemoptysis, sputum production, shortness of breath, wheezing and stridor.   Cardiovascular: Negative for chest pain, palpitations, orthopnea, claudication, leg swelling and PND.  Gastrointestinal: Positive for abdominal pain. Negative for heartburn, nausea, vomiting, diarrhea, constipation, blood in stool and melena.  Genitourinary: Negative for dysuria, urgency, frequency, hematuria and flank pain.  Musculoskeletal: Negative for myalgias, back pain, joint pain and falls.  Skin: Negative for itching and rash.  Neurological: Negative for dizziness, tingling, tremors, sensory change, speech change, focal weakness, seizures, loss of consciousness, weakness and headaches.  Endo/Heme/Allergies: Negative for environmental allergies and polydipsia. Does not bruise/bleed easily.  Psychiatric/Behavioral: Negative for depression, suicidal ideas, hallucinations, memory loss and substance abuse. The patient is not nervous/anxious and does not have insomnia.      PHYSICAL EXAM:  Blood pressure (!) 146/61, temperature 98.3 F (36.8 C), temperature source Oral, resp. rate 15, SpO2 100 %.    Vitals reviewed. Constitutional: She is oriented to person, place, and time. She appears well-developed and well-nourished.  HENT:  Head: Normocephalic and atraumatic.  Right Ear: External ear normal.  Left Ear: External ear normal.  Nose: Nose normal.  Mouth/Throat: Oropharynx is clear and moist.  Eyes: Conjunctivae and EOM are normal. Pupils are equal, round, and reactive to light. Right eye exhibits no  discharge. Left eye exhibits no discharge. No scleral icterus.  Neck: Normal range of motion. Neck supple. No tracheal deviation present. No thyromegaly present.  Cardiovascular: Normal rate, regular rhythm, normal heart sounds and intact distal pulses.  Exam reveals no gallop and no friction rub.   No murmur heard. Respiratory: Effort normal and breath sounds normal. No respiratory distress. She has no wheezes. She has no rales. She exhibits no tenderness.  GI: Soft. Bowel sounds are normal. She exhibits no distension and no mass. There is tenderness. There is no rebound and no guarding.  Genitourinary:       Vulva is normal without lesions Vagina is pink moist without discharge Cervix normal in appearance and pap is normal Uterus is normal size, contour, position, consistency, mobility, non-tender Adnexa is negative with normal sized ovaries by sonogram  Musculoskeletal: Normal range of motion. She exhibits no edema and no tenderness.  Neurological: She is alert and oriented to person, place, and time. She has normal reflexes. She displays normal reflexes. No cranial nerve deficit. She exhibits normal muscle tone. Coordination normal.  Skin: Skin is warm and dry. No rash noted. No erythema. No pallor.  Psychiatric: She has a normal mood and affect. Her behavior is normal. Judgment and thought content normal.    Labs: Results for orders placed or performed during the hospital encounter of 11/02/16 (from the past 336 hour(s))  Type and screen   Collection Time: 11/02/16  1:46 PM  Result Value Ref Range   ABO/RH(D) O POS    Antibody Screen NEG    Sample Expiration 11/05/2016   CBC   Collection Time: 11/02/16  1:55 PM  Result Value Ref Range   WBC 9.4 4.0 - 10.5 K/uL   RBC 4.46 3.87 - 5.11 MIL/uL   Hemoglobin 12.7 12.0 - 15.0 g/dL   HCT 39.7 36.0 - 46.0 %   MCV 89.0 78.0 - 100.0 fL   MCH 28.5 26.0 - 34.0 pg   MCHC 32.0 30.0 - 36.0 g/dL   RDW 15.0 11.5 - 15.5 %   Platelets 242 150 -  400 K/uL  Comprehensive metabolic panel   Collection Time: 11/02/16  1:55 PM  Result Value Ref Range   Sodium 135 135 - 145 mmol/L   Potassium 3.5 3.5 - 5.1 mmol/L   Chloride 102 101 - 111 mmol/L   CO2 26 22 - 32 mmol/L   Glucose, Bld 84 65 - 99 mg/dL   BUN 16 6 - 20 mg/dL   Creatinine, Ser 0.87 0.44 - 1.00 mg/dL   Calcium 9.1 8.9 - 10.3 mg/dL   Total Protein 7.6 6.5 - 8.1 g/dL   Albumin 4.2 3.5 - 5.0 g/dL   AST 19 15 - 41 U/L   ALT 20 14 - 54 U/L   Alkaline Phosphatase 120 38 - 126 U/L   Total Bilirubin 0.9 0.3 - 1.2 mg/dL   GFR calc non Af Amer >60 >60 mL/min   GFR calc Af Amer >60 >60 mL/min   Anion gap 7 5 - 15  Urinalysis, Routine w reflex microscopic   Collection Time: 11/02/16  3:40 PM  Result Value Ref Range   Color, Urine YELLOW YELLOW   APPearance CLEAR CLEAR   Specific Gravity, Urine  1.013 1.005 - 1.030   pH 7.0 5.0 - 8.0   Glucose, UA NEGATIVE NEGATIVE mg/dL   Hgb urine dipstick NEGATIVE NEGATIVE   Bilirubin Urine NEGATIVE NEGATIVE   Ketones, ur NEGATIVE NEGATIVE mg/dL   Protein, ur NEGATIVE NEGATIVE mg/dL   Nitrite NEGATIVE NEGATIVE   Leukocytes, UA TRACE (A) NEGATIVE   RBC / HPF 0-5 0 - 5 RBC/hpf   WBC, UA 0-5 0 - 5 WBC/hpf   Bacteria, UA NONE SEEN NONE SEEN    EKG: Orders placed or performed in visit on 10/12/16  . EKG 12-Lead    Imaging Studies: No results found.    Assessment: Post menopausal bleeding Endometrial vs lower uterine segment mass Patient Active Problem List   Diagnosis Date Noted  . Influenza A 11/26/2013  . Cough 11/26/2013  . PAF (paroxysmal atrial fibrillation) ([place]) 11/26/2013  . Essential hypertension, benign     Plan: Hysteroscopy uterine curettage  EURE,LUTHER H 11/07/2016 9:59 AM

## 2016-11-07 NOTE — Anesthesia Preprocedure Evaluation (Signed)
Anesthesia Evaluation  Patient identified by MRN, date of birth, ID band Patient awake    Reviewed: Allergy & Precautions, NPO status , Patient's Chart, lab work & pertinent test results  Airway Mallampati: II  TM Distance: >3 FB     Dental  (+) Teeth Intact   Pulmonary neg pulmonary ROS,    breath sounds clear to auscultation       Cardiovascular hypertension, Pt. on medications + dysrhythmias Atrial Fibrillation  Rhythm:Regular Rate:Bradycardia     Neuro/Psych    GI/Hepatic negative GI ROS,   Endo/Other    Renal/GU      Musculoskeletal  (+) Fibromyalgia -  Abdominal   Peds  Hematology   Anesthesia Other Findings   Reproductive/Obstetrics                             Anesthesia Physical Anesthesia Plan  ASA: III  Anesthesia Plan: General   Post-op Pain Management:    Induction: Intravenous  Airway Management Planned: LMA  Additional Equipment:   Intra-op Plan:   Post-operative Plan: Extubation in OR  Informed Consent: I have reviewed the patients History and Physical, chart, labs and discussed the procedure including the risks, benefits and alternatives for the proposed anesthesia with the patient or authorized representative who has indicated his/her understanding and acceptance.     Plan Discussed with:   Anesthesia Plan Comments:         Anesthesia Quick Evaluation

## 2016-11-07 NOTE — Op Note (Signed)
Preoperative diagnosis: Postmenopausal bleeding                                         Thickened endometrium, benign pathology in office   Postoperative diagnosis: Endometrial polyp x 1   Procedure: Operative hysteroscopy with removal of 1 polyp and uterine curettage  Surgeon: Tania Ade H   Anesthesia: Laryngeal mask airway   Findings:. 22 mm endometrium thought to be a mass vs blood, office biopsy revealed only blood Needs formal eval, small polyp and otherwise insignifcant endometrium is seen     Description of operation: Patient was taken to the operating room and placed in the supine position where she underwent laryngeal mask airway anesthesia. She was placed in the dorsal lithotomy position.  She was prepped and draped in the usual sterile fashion.  A Graves speculum was placed.  The cervix was grasped with a single-tooth tenaculum.  The cervix was dilated serially using Hegar dilators to allow passage of the hysteroscope.  The hysteroscope was then placed into the endometrial cavity without difficulty and the above noted findings were seen.  Randall stone forceps ring forceps and the uterine curet were used to remove the 1 polypThe endometrium was thoroughly curetted with good uterine cry in all areas  Hysteroscopic reevaluation confirmed the removal of all endometrial pathology  There was good hemostasis with only suspected minor endometrial using  The patient tolerated the procedure well  She experienced minimal blood loss  She was taken to the recovery room in good stable condition  All counts were correct x3  She received 2 g of Ancef and 30 mg of Toradol preoperatively  She will be followed up in the office next week for postoperative visit and to review the pathology which I expect to be benign  Florian Buff, MD 11/07/2016 11:00 AM

## 2016-11-08 ENCOUNTER — Telehealth: Payer: Self-pay | Admitting: *Deleted

## 2016-11-08 ENCOUNTER — Encounter (HOSPITAL_COMMUNITY): Payer: Self-pay | Admitting: Obstetrics & Gynecology

## 2016-11-08 NOTE — Addendum Note (Signed)
Addendum  created 11/08/16 0933 by Vista Deck, CRNA   Charge Capture section accepted

## 2016-11-08 NOTE — Telephone Encounter (Signed)
Pt made aware, voiced understanding. 

## 2016-11-08 NOTE — Telephone Encounter (Signed)
Can restart her eliquis tomorrow   J Crue Otero MD

## 2016-11-08 NOTE — Telephone Encounter (Signed)
Please give pt's daughter a call [phone] --she's needing to know when to start back on her Eliquis

## 2016-11-08 NOTE — Telephone Encounter (Signed)
Pt had a female procedure done yesterday and did not take her Eliquis, but wants to know when to restart it.

## 2016-11-20 ENCOUNTER — Encounter: Payer: Self-pay | Admitting: Obstetrics & Gynecology

## 2016-11-22 ENCOUNTER — Encounter: Payer: Self-pay | Admitting: Obstetrics & Gynecology

## 2016-11-26 ENCOUNTER — Encounter: Payer: Self-pay | Admitting: Obstetrics & Gynecology

## 2016-11-26 ENCOUNTER — Telehealth: Payer: Self-pay | Admitting: Obstetrics & Gynecology

## 2016-11-26 ENCOUNTER — Ambulatory Visit (INDEPENDENT_AMBULATORY_CARE_PROVIDER_SITE_OTHER): Payer: Medicare Other | Admitting: Obstetrics & Gynecology

## 2016-11-26 VITALS — BP 130/60 | HR 74 | Wt 179.0 lb

## 2016-11-26 DIAGNOSIS — Z9889 Other specified postprocedural states: Secondary | ICD-10-CM

## 2016-11-26 DIAGNOSIS — N8502 Endometrial intraepithelial neoplasia [EIN]: Secondary | ICD-10-CM

## 2016-11-26 MED ORDER — MEGESTROL ACETATE 20 MG PO TABS: 40.0000 mg | ORAL_TABLET | Freq: Every day | ORAL | 11 refills | Status: DC

## 2016-11-26 NOTE — Telephone Encounter (Signed)
Spoke with patients daughter to clarify information from Dr Elonda Husky.

## 2016-11-26 NOTE — Progress Notes (Signed)
  HPI: Patient returns for routine postoperative follow-up having undergone hysteroscopy uterine curettage on 11/07/2016.  The patient's immediate postoperative recovery has been unremarkable. Since hospital discharge the patient reports no problems.   Current Outpatient Prescriptions: apixaban (ELIQUIS) 5 MG TABS tablet, Take 1 tablet (5 mg total) by mouth 2 (two) times daily. (Patient taking differently: Take 5 mg by mouth 2 (two) times daily. ON HOLD 09/14/16), Disp: 180 tablet, Rfl: 3 diltiazem (CARDIZEM CD) 120 MG 24 hr capsule, Take 1 capsule (120 mg total) by mouth daily., Disp: 90 capsule, Rfl: 3 losartan-hydrochlorothiazide (HYZAAR) 100-12.5 MG tablet, Take 1 tablet by mouth daily. , Disp: , Rfl:   No current facility-administered medications for this visit.     Blood pressure 130/60, pulse 74, weight 179 lb (81.2 kg).  Physical Exam: Normal post op  Diagnostic Tests:   Pathology: Complex hyperplasia without atypia  Impression: CAH w/o Atypia  Plan: Chronic megestrol therapy with endometrial reassessment in 6 months  Follow up: 6  months  Sonogram to check endometrium  Florian Buff, MD

## 2016-12-10 ENCOUNTER — Ambulatory Visit (INDEPENDENT_AMBULATORY_CARE_PROVIDER_SITE_OTHER): Payer: Medicare Other | Admitting: Adult Health

## 2016-12-10 ENCOUNTER — Encounter: Payer: Self-pay | Admitting: Adult Health

## 2016-12-10 VITALS — BP 150/60 | HR 55 | Ht 62.0 in | Wt 177.0 lb

## 2016-12-10 DIAGNOSIS — I10 Essential (primary) hypertension: Secondary | ICD-10-CM | POA: Diagnosis not present

## 2016-12-10 DIAGNOSIS — I4819 Other persistent atrial fibrillation: Secondary | ICD-10-CM

## 2016-12-10 DIAGNOSIS — I481 Persistent atrial fibrillation: Secondary | ICD-10-CM | POA: Diagnosis not present

## 2016-12-10 NOTE — Progress Notes (Signed)
Cardiology Office Note   Date:  12/10/2016   ID:  Anijha, Smolinsky 1946-06-11, MRN [id number]  PCP:  Wende Neighbors, MD  Cardiologist: [place]/   Jory Sims, NP   Chief Complaint  Patient presents with  . Atrial Fibrillation    History of Present Illness: Vanessa Anderson is a 71 y.o. female who presents for ongoing assessment and management of paroxysmal atrial fibrillation, unable to tolerate anticoagulation therapy due to abnormal vaginal bleeding. She was due to have a D&C January 2018. She was last seen in the office on 10/12/2016. No medications were changed as she had only had one episode of rapid heart rhythm, and otherwise remained stable. She was to restart [facility] after D&C when agreed to by surgeon.  She has restarted her ELIQUIS, is feeling fine. No evidence of bleeding or trouble with excessive bruising. She is recovered well from her surgery as well. Blood pressure slightly elevated today she has taken all of her medicines and is medically compliant.  Past Medical History:  Diagnosis Date  . Atrial fibrillation ([place])    In the setting of influenza  . Essential hypertension, benign   . Fibromyalgia     Past Surgical History:  Procedure Laterality Date  . CESAREAN SECTION    . COLONOSCOPY N/A 05/28/2014   Procedure: COLONOSCOPY;  Surgeon: Danie Binder, MD;  Location[facility];  Service: Endoscopy;  Laterality: N/A;  8:30 AM  . HYSTEROSCOPY W/D&C N/A 11/07/2016   Procedure: DILATATION AND CURETTAGE /HYSTEROSCOPY;  Surgeon: Florian Buff, MD;  Location: [facility] ORS;  Service: Gynecology;  Laterality: N/A;     Current Outpatient Prescriptions  Medication Sig Dispense Refill  . apixaban (ELIQUIS) 5 MG TABS tablet Take 1 tablet (5 mg total) by mouth 2 (two) times daily. (Patient taking differently: Take 5 mg by mouth 2 (two) times daily. ON HOLD 09/14/16) 180 tablet 3  . diltiazem (CARDIZEM CD) 120 MG 24 hr capsule Take 1 capsule (120 mg total) by mouth daily. 90  capsule 3  . losartan-hydrochlorothiazide (HYZAAR) 100-12.5 MG tablet Take 1 tablet by mouth daily.     . megestrol (MEGACE) 20 MG tablet Take 2 tablets (40 mg total) by mouth daily. 30 tablet 11   No current facility-administered medications for this visit.     Allergies:   Hydrocodone    Social History:  The patient  reports that she has never smoked. She has never used smokeless tobacco. She reports that she does not drink alcohol or use drugs.   Family History:  The patient's family history is not on file.    ROS: All other systems are reviewed and negative. Unless otherwise mentioned in H&P    PHYSICAL EXAM: VS:  BP (!) 150/60   Pulse (!) 55   Ht 5\' 2"  (1.575 m)   Wt 177 lb (80.3 kg)   SpO2 99%   BMI 32.37 kg/m  , BMI Body mass index is 32.37 kg/m. GEN: Well nourished, well developed, in no acute distress  HEENT: normal  Neck: no JVD, carotid bruits, or masses Cardiac: IRRR; no murmurs, rubs, or gallops,no edema  Respiratory:  clear to auscultation bilaterally, normal work of breathing GI: soft, nontender, nondistended, + BS MS: no deformity or atrophy  Skin: warm and dry, no rash Neuro:  Strength and sensation are intact Psych: euthymic mood, full affect   Recent Labs: 11/02/2016: ALT 20; BUN 16; Creatinine, Ser 0.87; Hemoglobin 12.7; Platelets 242; Potassium 3.5; Sodium 135  Wt Readings from Last 3 Encounters:  12/10/16 177 lb (80.3 kg)  11/26/16 179 lb (81.2 kg)  11/02/16 179 lb 4 oz (81.3 kg)      Other studies Reviewed: Additional studies/ records that were reviewed today include:  Echocardiogram/ 12/11/2015 Left ventricle: The cavity size was normal. Wall thickness was   increased in a pattern of mild LVH. Systolic function was normal.   The estimated ejection fraction was in the range of 55% to 60%.   Wall motion was normal; there were no regional wall motion   abnormalities. Left ventricular diastolic function parameters   were normal for the  patient&'s age. - Aortic valve: Mildly calcified annulus. Trileaflet. - Mitral valve: There was mild regurgitation. - Left atrium: The atrium was mildly dilated. - Right atrium: Central venous pressure (est): 3 mm Hg. - Tricuspid valve: There was trivial regurgitation. - Pulmonary arteries: Systolic pressure could not be accurately   estimated. - Pericardium, extracardiac: A small pericardial effusion was   identified posterior to the heart.    ASSESSMENT AND PLAN:  1.  Atrial fibrillation: Rate is well controlled on diltiazem. She remains on ELIQUIS 5 mg twice a day. She's had no recurrent bleeding since having OB/GYN intervention with surgery. We'll continue her on this medication.  2. Hypertension: Slightly elevated today. Review of past blood pressures demonstrated adequate control. We'll not make any changes based on one blood pressure today.     Current medicines are reviewed at length with the patient today.    Labs/ tests ordered today include:  No orders of the defined types were placed in this encounter.    Disposition:   FU with 6 months  Signed, Jory Sims, NP  12/10/2016 1:23 PM    [facility] [address] [place], [place] [address] Phone: [phone]; Fax: [phone]

## 2016-12-10 NOTE — Progress Notes (Signed)
Name: Vanessa Anderson    DOB: Feb 27, 1946  Age: 71 y.o.  MR#: [id number]       PCP:  Wende Neighbors, MD      Insurance: Payor: [employer] MEDICARE / Plan: [facility] MEDICARE / Product Type: *No Product type* /   CC:    Chief Complaint  Patient presents with  . Atrial Fibrillation    VS Vitals:   12/10/16 1309  BP: (!) 150/60  Pulse: (!) 55  SpO2: 99%  Weight: 177 lb (80.3 kg)  Height: 5\' 2"  (1.575 m)    Weights Current Weight  12/10/16 177 lb (80.3 kg)  11/26/16 179 lb (81.2 kg)  11/02/16 179 lb 4 oz (81.3 kg)    Blood Pressure  BP Readings from Last 3 Encounters:  12/10/16 (!) 150/60  11/26/16 130/60  11/07/16 (!) 123/46     Admit date:  (Not on file) Last encounter with RMR:  10/12/2016   Allergy Hydrocodone  Current Outpatient Prescriptions  Medication Sig Dispense Refill  . apixaban (ELIQUIS) 5 MG TABS tablet Take 1 tablet (5 mg total) by mouth 2 (two) times daily. (Patient taking differently: Take 5 mg by mouth 2 (two) times daily. ON HOLD 09/14/16) 180 tablet 3  . diltiazem (CARDIZEM CD) 120 MG 24 hr capsule Take 1 capsule (120 mg total) by mouth daily. 90 capsule 3  . losartan-hydrochlorothiazide (HYZAAR) 100-12.5 MG tablet Take 1 tablet by mouth daily.     . megestrol (MEGACE) 20 MG tablet Take 2 tablets (40 mg total) by mouth daily. 30 tablet 11   No current facility-administered medications for this visit.     Discontinued Meds:   There are no discontinued medications.  Patient Active Problem List   Diagnosis Date Noted  . Influenza A 11/26/2013  . Cough 11/26/2013  . PAF (paroxysmal atrial fibrillation) ([place]) 11/26/2013  . Essential hypertension, benign     LABS    Component Value Date/Time   NA 135 11/02/2016 1355   NA 138 09/13/2016 1043   NA 138 03/22/2016 1551   K 3.5 11/02/2016 1355   K 4.0 09/13/2016 1043   K 3.8 03/22/2016 1551   CL 102 11/02/2016 1355   CL 104 09/13/2016 1043   CL 104 03/22/2016 1551   CO2 26 11/02/2016 1355   CO2 27  09/13/2016 1043   CO2 26 03/22/2016 1551   GLUCOSE 84 11/02/2016 1355   GLUCOSE 122 (H) 09/13/2016 1043   GLUCOSE 153 (H) 03/22/2016 1551   BUN 16 11/02/2016 1355   BUN 14 09/13/2016 1043   BUN 22 (H) 03/22/2016 1551   CREATININE 0.87 11/02/2016 1355   CREATININE 0.98 09/13/2016 1043   CREATININE 1.11 (H) 03/22/2016 1551   CALCIUM 9.1 11/02/2016 1355   CALCIUM 9.4 09/13/2016 1043   CALCIUM 9.4 03/22/2016 1551   GFRNONAA >60 11/02/2016 1355   GFRNONAA 57 (L) 09/13/2016 1043   GFRNONAA 49 (L) 03/22/2016 1551   GFRAA >60 11/02/2016 1355   GFRAA >60 09/13/2016 1043   GFRAA 57 (L) 03/22/2016 1551   CMP     Component Value Date/Time   NA 135 11/02/2016 1355   K 3.5 11/02/2016 1355   CL 102 11/02/2016 1355   CO2 26 11/02/2016 1355   GLUCOSE 84 11/02/2016 1355   BUN 16 11/02/2016 1355   CREATININE 0.87 11/02/2016 1355   CALCIUM 9.1 11/02/2016 1355   PROT 7.6 11/02/2016 1355   ALBUMIN 4.2 11/02/2016 1355   AST 19 11/02/2016 1355  ALT 20 11/02/2016 1355   ALKPHOS 120 11/02/2016 1355   BILITOT 0.9 11/02/2016 1355   GFRNONAA >60 11/02/2016 1355   GFRAA >60 11/02/2016 1355       Component Value Date/Time   WBC 9.4 11/02/2016 1355   WBC 10.6 (H) 09/13/2016 1043   WBC 12.5 (H) 03/22/2016 1551   HGB 12.7 11/02/2016 1355   HGB 13.9 09/13/2016 1906   HGB 14.1 09/13/2016 1043   HCT 39.7 11/02/2016 1355   HCT 42.0 09/13/2016 1906   HCT 43.5 09/13/2016 1043   MCV 89.0 11/02/2016 1355   MCV 86.0 09/13/2016 1043   MCV 88.5 03/22/2016 1551    Lipid Panel  No results found for: CHOL, TRIG, HDL, CHOLHDL, VLDL, LDLCALC, LDLDIRECT  ABG No results found for: PHART, PCO2ART, PO2ART, HCO3, TCO2, ACIDBASEDEF, O2SAT   Lab Results  Component Value Date   TSH 3.534 11/26/2013   BNP (last 3 results) No results for input(s): BNP in the last 8760 hours.  ProBNP (last 3 results) No results for input(s): PROBNP in the last 8760 hours.  Cardiac Panel (last 3 results) No results for  input(s): CKTOTAL, CKMB, TROPONINI, RELINDX in the last 72 hours.  Iron/TIBC/Ferritin/ %Sat No results found for: IRON, TIBC, FERRITIN, IRONPCTSAT   EKG Orders placed or performed in visit on 10/12/16  . EKG 12-Lead     Prior Assessment and Plan Problem List as of 12/10/2016 Reviewed: 11/26/2016  2:23 PM by Florian Buff, MD     Cardiovascular and Mediastinum   Essential hypertension, benign   Last Assessment & Plan 01/29/2014 Office Visit Written 01/29/2014  9:42 AM by Satira Sark, MD    Cough has resolved off ACE inhibitor, she is tolerating Norvasc. Would recommend follow with Dr. [name] at this point for medication adjustments and refills.      PAF (paroxysmal atrial fibrillation) [facility])   Last Assessment & Plan 01/29/2014 Office Visit Written 01/29/2014  9:41 AM by Satira Sark, MD    Single episode noted with influenza A back in January. No recurrence. Would recommend observation for now. She will stay on aspirin at this point. Keep follow with Dr. [name], we can see her back if she has recurring arrhythmias.        Respiratory   Influenza A   Last Assessment & Plan 12/18/2013 Office Visit Written 12/18/2013  3:08 PM by Lendon Colonel, NP    She has recovered well. She denies any flulike symptoms. She will follow with her PCP.        Other   Cough       Imaging: No results found.

## 2016-12-10 NOTE — Patient Instructions (Signed)
Your physician wants you to follow-up in: 6 Months with Dr. McDowell.  You will receive a reminder letter in the mail two months in advance. If you don't receive a letter, please call our office to schedule the follow-up appointment.  Your physician recommends that you continue on your current medications as directed. Please refer to the Current Medication list given to you today.  If you need a refill on your cardiac medications before your next appointment, please call your pharmacy.  Thank you for choosing [facility]! '  

## 2017-01-28 ENCOUNTER — Ambulatory Visit: Payer: Self-pay | Admitting: Cardiology

## 2017-01-28 DIAGNOSIS — R070 Pain in throat: Secondary | ICD-10-CM | POA: Diagnosis not present

## 2017-01-28 DIAGNOSIS — R0981 Nasal congestion: Secondary | ICD-10-CM | POA: Diagnosis not present

## 2017-05-24 ENCOUNTER — Other Ambulatory Visit: Payer: Self-pay | Admitting: Obstetrics & Gynecology

## 2017-05-24 DIAGNOSIS — N950 Postmenopausal bleeding: Secondary | ICD-10-CM

## 2017-05-27 ENCOUNTER — Ambulatory Visit (INDEPENDENT_AMBULATORY_CARE_PROVIDER_SITE_OTHER): Payer: Medicare Other | Admitting: Obstetrics & Gynecology

## 2017-05-27 ENCOUNTER — Ambulatory Visit (INDEPENDENT_AMBULATORY_CARE_PROVIDER_SITE_OTHER): Payer: Medicare Other

## 2017-05-27 ENCOUNTER — Encounter: Payer: Self-pay | Admitting: Obstetrics & Gynecology

## 2017-05-27 VITALS — BP 150/70 | HR 76 | Wt 171.0 lb

## 2017-05-27 DIAGNOSIS — N950 Postmenopausal bleeding: Secondary | ICD-10-CM | POA: Diagnosis not present

## 2017-05-27 DIAGNOSIS — N8501 Benign endometrial hyperplasia: Secondary | ICD-10-CM | POA: Diagnosis not present

## 2017-05-27 MED ORDER — MEGESTROL ACETATE 20 MG PO TABS: 40.0000 mg | ORAL_TABLET | Freq: Every day | ORAL | 11 refills | Status: DC

## 2017-05-27 NOTE — Progress Notes (Signed)
US PELVIC US TA/TV: heterogeneous anteverted uterus w/a posterior left subserosal fibroid 3.1 x 2.9 x 3 cm,limited view of ovaries,ovaries appear wnl,complex thickened endometrium,no color flow visualized,EEC 14.7 mm,no free fluid,no pain during ultrasound

## 2017-05-27 NOTE — Progress Notes (Signed)
Follow up appointment for results  Chief Complaint  Patient presents with  . Follow-up    ultrasound- room# 11    Blood pressure (!) 150/70, pulse 76, weight 171 lb (77.6 kg).  US Transvaginal Non-ob  Result Date: 05/27/2017 GYNECOLOGIC SONOGRAM KAIYAH EBER is a 71 y.o. she is here for a pelvic sonogram for postmenopausal bleeding. Uterus                      10.3 x 5 x 4.9 cm, vol 131 ml, heterogeneous anteverted uterus w/a posterior left subserosal fibroid 3.1 x 2.9 x 3 cm Endometrium          14.7 mm, symmetrical, complex thickened endometrium,no color flow visualized Right ovary             1.6 x 1.7 x 1.4 cm, wnl,limited view Left ovary                1.4 x 1 x 1.7 cm, wnl (not visualized on TV,TA only)  no free fluid Technician Comments: US PELVIC US TA/TV: heterogeneous anteverted uterus w/a posterior left subserosal fibroid 3.1 x 2.9 x 3 cm,limited view of ovaries,ovaries appear wnl,complex thickened endometrium,no color flow visualized,EEC 14.7 mm,no free fluid,no pain during ultrasound [employer] 05/27/2017 2:16 PM Clinical Impression and recommendations: I have reviewed the sonogram results above, combined with the patient's current clinical course, below are my impressions and any appropriate recommendations for management based on the sonographic findings. Patient is known to have CAH of the endometrium without atypia, sonogram today is for follow up evaluation ptient reports no bleeding Uterus is enlarged  for a post menopausal woman but stable for this patient Endometrium is consistent with megestrol therapy, no heterogeneity Ovaries are post menopausal and normal Will do endometrial sampling at 1 year post op from hysteroscopy uterine curettage which will be January 2019 Continue on megestrol daily for endometrial suppression for CAH without atypia EURE,LUTHER H 05/27/2017 3:03 PM   US Pelvis Complete  Result Date: 05/27/2017 GYNECOLOGIC SONOGRAM JOHNETTA SLONIKER is a 71 y.o. she  is here for a pelvic sonogram for postmenopausal bleeding. Uterus                      10.3 x 5 x 4.9 cm, vol 131 ml, heterogeneous anteverted uterus w/a posterior left subserosal fibroid 3.1 x 2.9 x 3 cm Endometrium          14.7 mm, symmetrical, complex thickened endometrium,no color flow visualized Right ovary             1.6 x 1.7 x 1.4 cm, wnl,limited view Left ovary                1.4 x 1 x 1.7 cm, wnl (not visualized on TV,TA only)  no free fluid Technician Comments: US PELVIC US TA/TV: heterogeneous anteverted uterus w/a posterior left subserosal fibroid 3.1 x 2.9 x 3 cm,limited view of ovaries,ovaries appear wnl,complex thickened endometrium,no color flow visualized,EEC 14.7 mm,no free fluid,no pain during ultrasound [employer] 05/27/2017 2:16 PM Clinical Impression and recommendations: I have reviewed the sonogram results above, combined with the patient's current clinical course, below are my impressions and any appropriate recommendations for management based on the sonographic findings. Patient is known to have CAH of the endometrium without atypia, sonogram today is for follow up evaluation ptient reports no bleeding Uterus is enlarged  for a post menopausal woman but stable for this  patient Endometrium is consistent with megestrol therapy, no heterogeneity Ovaries are post menopausal and normal Will do endometrial sampling at 1 year post op from hysteroscopy uterine curettage which will be January 2019 Continue on megestrol daily for endometrial suppression for CAH without atypia EURE,LUTHER H 05/27/2017 3:03 PM      MEDS ordered this encounter: Meds ordered this encounter  Medications  . diltiazem (CARDIZEM) 120 MG tablet    Sig: Take 120 mg by mouth 4 (four) times daily.  . megestrol (MEGACE) 20 MG tablet    Sig: Take 2 tablets (40 mg total) by mouth daily.    Dispense:  60 tablet    Refill:  11    Orders for this encounter: No orders of the defined types were placed in this  encounter.   Impression: Complex endometrial hyperplasia without atypia - on chronic progesterone therapy    Plan: Follow up with endometrial biopsy in January 2019  Follow Up: Return in about 6 months (around 11/27/2017) for Follow up, with Dr Elonda Husky.       Face to face time:  10 minutes  Greater than 50% of the visit time was spent in counseling and coordination of care with the patient.  The summary and outline of the counseling and care coordination is summarized in the note above.   All questions were answered.  Past Medical History:  Diagnosis Date  . Atrial fibrillation ([place])    In the setting of influenza  . Essential hypertension, benign   . Fibromyalgia     Past Surgical History:  Procedure Laterality Date  . CESAREAN SECTION    . COLONOSCOPY N/A 05/28/2014   Procedure: COLONOSCOPY;  Surgeon: Danie Binder, MD;  [facility];  Service: Endoscopy;  Laterality: N/A;  8:30 AM  . HYSTEROSCOPY W/D&C N/A 11/07/2016   Procedure: DILATATION AND CURETTAGE /HYSTEROSCOPY;  Surgeon: Florian Buff, MD;  Location: [facility] ORS;  Service: Gynecology;  Laterality: N/A;    OB History    Gravida Para Term Preterm AB Living   2             SAB TAB Ectopic Multiple Live Births                  Allergies  Allergen Reactions  . Hydrocodone Itching    Social History   Social History  . Marital status: Married    Spouse name: N/A  . Number of children: N/A  . Years of education: N/A   Social History Main Topics  . Smoking status: Never Smoker  . Smokeless tobacco: Never Used  . Alcohol use No  . Drug use: No  . Sexual activity: Not Currently   Other Topics Concern  . None   Social History Narrative  . None    History reviewed. No pertinent family history.

## 2017-06-10 ENCOUNTER — Encounter: Payer: Self-pay | Admitting: Adult Health

## 2017-06-10 ENCOUNTER — Other Ambulatory Visit (HOSPITAL_COMMUNITY)
Admission: RE | Admit: 2017-06-10 | Discharge: 2017-06-10 | Disposition: A | Discharge status: Home / Self Care | Payer: Medicare Other | Source: Ambulatory Visit | Attending: Adult Health | Admitting: Adult Health

## 2017-06-10 ENCOUNTER — Ambulatory Visit (INDEPENDENT_AMBULATORY_CARE_PROVIDER_SITE_OTHER): Payer: Medicare Other | Admitting: Adult Health

## 2017-06-10 VITALS — BP 136/68 | HR 56 | Ht 62.0 in | Wt 173.0 lb

## 2017-06-10 DIAGNOSIS — N289 Disorder of kidney and ureter, unspecified: Secondary | ICD-10-CM | POA: Diagnosis not present

## 2017-06-10 DIAGNOSIS — D508 Other iron deficiency anemias: Secondary | ICD-10-CM | POA: Insufficient documentation

## 2017-06-10 DIAGNOSIS — I480 Paroxysmal atrial fibrillation: Secondary | ICD-10-CM

## 2017-06-10 DIAGNOSIS — I10 Essential (primary) hypertension: Secondary | ICD-10-CM

## 2017-06-10 LAB — CBC WITH DIFFERENTIAL/PLATELET
BASOS ABS: 0 10*3/uL (ref 0.0–0.1)
Basophils Relative: 0 %
Eosinophils Absolute: 0.1 10*3/uL (ref 0.0–0.7)
Eosinophils Relative: 1 %
HEMATOCRIT: 39.1 % (ref 36.0–46.0)
Hemoglobin: 12.7 g/dL (ref 12.0–15.0)
LYMPHS PCT: 25 %
Lymphs Abs: 3 10*3/uL (ref 0.7–4.0)
MCH: 29.4 pg (ref 26.0–34.0)
MCHC: 32.5 g/dL (ref 30.0–36.0)
MCV: 90.5 fL (ref 78.0–100.0)
MONO ABS: 0.9 10*3/uL (ref 0.1–1.0)
MONOS PCT: 7 %
NEUTROS ABS: 8 10*3/uL — AB (ref 1.7–7.7)
Neutrophils Relative %: 67 %
Platelets: 264 10*3/uL (ref 150–400)
RBC: 4.32 MIL/uL (ref 3.87–5.11)
RDW: 13.5 % (ref 11.5–15.5)
WBC: 12 10*3/uL — ABNORMAL HIGH (ref 4.0–10.5)

## 2017-06-10 LAB — BASIC METABOLIC PANEL
ANION GAP: 8 (ref 5–15)
BUN: 16 mg/dL (ref 6–20)
CALCIUM: 9.7 mg/dL (ref 8.9–10.3)
CHLORIDE: 106 mmol/L (ref 101–111)
CO2: 25 mmol/L (ref 22–32)
Creatinine, Ser: 1.1 mg/dL — ABNORMAL HIGH (ref 0.44–1.00)
GFR calc Af Amer: 57 mL/min — ABNORMAL LOW (ref >60)
GFR calc non Af Amer: 49 mL/min — ABNORMAL LOW (ref >60)
GLUCOSE: 93 mg/dL (ref 65–99)
Potassium: 4.1 mmol/L (ref 3.5–5.1)
Sodium: 139 mmol/L (ref 135–145)

## 2017-06-10 MED ORDER — APIXABAN 5 MG PO TABS
5.0000 mg | ORAL_TABLET | Freq: Two times a day (BID) | ORAL | 3 refills | Status: DC
Start: 2017-06-10 — End: 2018-06-27

## 2017-06-10 MED ORDER — DILTIAZEM HCL ER COATED BEADS 120 MG PO CP24: 120.0000 mg | ORAL_CAPSULE | Freq: Every day | ORAL | 3 refills | Status: DC

## 2017-06-10 MED ORDER — LOSARTAN POTASSIUM-HCTZ 100-12.5 MG PO TABS: 1.0000 | ORAL_TABLET | Freq: Every day | ORAL | 3 refills | Status: DC

## 2017-06-10 NOTE — Progress Notes (Signed)
Cardiology Office Note   Date:  06/10/2017   ID:  Jamin, Humphries 1946/04/04, MRN [id number]  PCP:  Celene Squibb, MD  Cardiologist:  [name]  Chief Complaint  Patient presents with  . Atrial Fibrillation  . Hypertension      History of Present Illness: Vanessa Anderson is a 71 y.o. female who presents for ongoing assessment and management of paroxysmal atrial fibrillation, on ELIQUIS, other history to include hypertension, and fibromyalgia. She was last seen in the office on 12/10/2016. No medication changes were made heart rate was well-controlled.  She is without complaints today. She denies any hospitalizations ER visits or new diagnoses. The patient is medically compliant. She denies rapid heart rhythm or palpitations. Blood pressure is well-controlled.  Past Medical History:  Diagnosis Date  . Atrial fibrillation ([place])    In the setting of influenza  . Essential hypertension, benign   . Fibromyalgia     Past Surgical History:  Procedure Laterality Date  . CESAREAN SECTION    . COLONOSCOPY N/A 05/28/2014   Procedure: COLONOSCOPY;  Surgeon: Danie Binder, MD;  [facility];  Service: Endoscopy;  Laterality: N/A;  8:30 AM  . HYSTEROSCOPY W/D&C N/A 11/07/2016   Procedure: DILATATION AND CURETTAGE /HYSTEROSCOPY;  Surgeon: Florian Buff, MD;  Location: [facility]S;  Service: Gynecology;  Laterality: N/A;     Current Outpatient Prescriptions  Medication Sig Dispense Refill  . apixaban (ELIQUIS) 5 MG TABS tablet Take 1 tablet (5 mg total) by mouth 2 (two) times daily. (Patient taking differently: Take 5 mg by mouth 2 (two) times daily. ON HOLD 09/14/16) 180 tablet 3  . diltiazem (CARDIZEM CD) 120 MG 24 hr capsule Take 1 capsule (120 mg total) by mouth daily. 90 capsule 3  . diltiazem (CARDIZEM) 120 MG tablet Take 120 mg by mouth 4 (four) times daily.    Marland Kitchen losartan-hydrochlorothiazide (HYZAAR) 100-12.5 MG tablet Take 1 tablet by mouth daily.     . megestrol (MEGACE) 20  MG tablet Take 2 tablets (40 mg total) by mouth daily. 60 tablet 11   No current facility-administered medications for this visit.     Allergies:   Hydrocodone    Social History:  The patient  reports that she has never smoked. She has never used smokeless tobacco. She reports that she does not drink alcohol or use drugs.   Family History:  The patient's family history includes Heart attack in her father.    ROS: All other systems are reviewed and negative. Unless otherwise mentioned in H&P    PHYSICAL EXAM: VS:  BP 136/68   Pulse (!) 56   Ht 5\' 2"  (1.575 m)   Wt 173 lb (78.5 kg)   SpO2 97%   BMI 31.64 kg/m  , BMI Body mass index is 31.64 kg/m. GEN: Well nourished, well developed, in no acute distress  HEENT: normal  Neck: no JVD, carotid bruits, or masses Cardiac: RRR; no murmurs, rubs, or gallops,no edema  Respiratory:  clear to auscultation bilaterally, normal work of breathing GI: soft, nontender, nondistended, + BS MS: no deformity or atrophy  Skin: warm and dry, no rash Neuro:  Strength and sensation are intact Psych: euthymic mood, full affect   Recent Labs: 11/02/2016: ALT 20; BUN 16; Creatinine, Ser 0.87; Hemoglobin 12.7; Platelets 242; Potassium 3.5; Sodium 135    Lipid Panel No results found for: CHOL, TRIG, HDL, CHOLHDL, VLDL, LDLCALC, LDLDIRECT    Wt Readings from Last 3 Encounters:  06/10/17 173 lb (78.5 kg)  05/27/17 171 lb (77.6 kg)  12/10/16 177 lb (80.3 kg)      Other studies Reviewed: Echocardiogram 2016-04-02 Left ventricle: The cavity size was normal. Wall thickness was   increased in a pattern of mild LVH. Systolic function was normal.   The estimated ejection fraction was in the range of 55% to 60%.   Wall motion was normal; there were no regional wall motion   abnormalities. Left ventricular diastolic function parameters   were normal for the patient&'s age. - Aortic valve: Mildly calcified annulus. Trileaflet. - Mitral valve: There  was mild regurgitation. - Left atrium: The atrium was mildly dilated. - Right atrium: Central venous pressure (est): 3 mm Hg. - Tricuspid valve: There was trivial regurgitation. - Pulmonary arteries: Systolic pressure could not be accurately   estimated. - Pericardium, extracardiac: A small pericardial effusion was   identified posterior to the heart.  ASSESSMENT AND PLAN:  1. Paroxysmal atrial fibrillation: Heart rate remains well controlled on diltiazem 120 mg daily. She is tolerating apixaban 5 mg twice a day.Prescriptions are provided for both medications. I will check a CBC.  2. Hypertension: Well-controlled currently on both diltiazem and losartan HCTZ. Will check a BMET for kidney function. Prescription refills are provided.     Current medicines are reviewed at length with the patient today.    Labs/ tests ordered today include: CBC and BMET  Phill Myron. West Pugh, ANP, AACC   06/10/2017 1:43 PM    [facility] [address] [place], [place] [address] Phone: [phone]; Fax: [phone]

## 2017-06-10 NOTE — Patient Instructions (Signed)

## 2017-07-04 DIAGNOSIS — N390 Urinary tract infection, site not specified: Secondary | ICD-10-CM | POA: Diagnosis not present

## 2017-07-10 DIAGNOSIS — N390 Urinary tract infection, site not specified: Secondary | ICD-10-CM | POA: Diagnosis not present

## 2017-07-23 ENCOUNTER — Telehealth: Payer: Self-pay | Admitting: Cardiology

## 2017-07-23 NOTE — Telephone Encounter (Signed)
Pt is in the doughnut hole w/ her Eliquis, she cannot afford to pay for it. Requesting samples

## 2017-07-24 NOTE — Telephone Encounter (Signed)
Three sample boxes of Eliquis 5 mg Lot# [id number], EXP: DEC 2020 placed at the front desk for patient pick up. Pt. Notified that office is unable to provide samples throughout the rest of the year but will provide them when able. Pt. Voiced understanding.

## 2017-07-31 DIAGNOSIS — Z23 Encounter for immunization: Secondary | ICD-10-CM | POA: Diagnosis not present

## 2017-10-22 ENCOUNTER — Other Ambulatory Visit (HOSPITAL_COMMUNITY): Payer: Self-pay | Admitting: Internal Medicine

## 2017-10-22 DIAGNOSIS — Z1231 Encounter for screening mammogram for malignant neoplasm of breast: Secondary | ICD-10-CM

## 2017-10-25 ENCOUNTER — Ambulatory Visit (HOSPITAL_COMMUNITY)
Admission: RE | Admit: 2017-10-25 | Discharge: 2017-10-25 | Disposition: A | Discharge status: Home / Self Care | Payer: Medicare Other | Source: Ambulatory Visit | Attending: Internal Medicine | Admitting: Internal Medicine

## 2017-10-25 DIAGNOSIS — Z1231 Encounter for screening mammogram for malignant neoplasm of breast: Secondary | ICD-10-CM

## 2017-11-25 DIAGNOSIS — H2513 Age-related nuclear cataract, bilateral: Secondary | ICD-10-CM | POA: Diagnosis not present

## 2017-11-25 DIAGNOSIS — H5203 Hypermetropia, bilateral: Secondary | ICD-10-CM | POA: Diagnosis not present

## 2017-11-25 DIAGNOSIS — H524 Presbyopia: Secondary | ICD-10-CM | POA: Diagnosis not present

## 2017-11-25 DIAGNOSIS — H40013 Open angle with borderline findings, low risk, bilateral: Secondary | ICD-10-CM | POA: Diagnosis not present

## 2017-11-26 DIAGNOSIS — R55 Syncope and collapse: Secondary | ICD-10-CM | POA: Diagnosis not present

## 2017-11-28 ENCOUNTER — Other Ambulatory Visit: Payer: Self-pay | Admitting: Obstetrics & Gynecology

## 2017-12-12 ENCOUNTER — Ambulatory Visit: Payer: Medicare Other | Admitting: Obstetrics & Gynecology

## 2017-12-12 ENCOUNTER — Encounter: Payer: Self-pay | Admitting: Obstetrics & Gynecology

## 2017-12-12 ENCOUNTER — Other Ambulatory Visit: Payer: Self-pay | Admitting: Obstetrics & Gynecology

## 2017-12-12 VITALS — BP 140/80 | HR 68 | Wt 176.0 lb

## 2017-12-12 DIAGNOSIS — N8501 Benign endometrial hyperplasia: Secondary | ICD-10-CM | POA: Diagnosis not present

## 2017-12-12 DIAGNOSIS — N840 Polyp of corpus uteri: Secondary | ICD-10-CM | POA: Diagnosis not present

## 2017-12-12 NOTE — Progress Notes (Signed)
Endometrial Biopsy Procedure Note  Pre-operative Diagnosis: Hx of complex endometrial hyperplasia without atypia on chronic progestational therapy  Post-operative Diagnosis: normal  Indications: as above  Procedure Details   Urine pregnancy test was not done.  The risks (including infection, bleeding, pain, and uterine perforation) and benefits of the procedure were explained to the patient and Written informed consent was obtained.  Antibiotic prophylaxis against endocarditis was not indicated.   The patient was placed in the dorsal lithotomy position.  Bimanual exam showed the uterus to be in the neutral position.  A Graves' speculum inserted in the vagina, and the cervix prepped with povidone iodine.  Endocervical curettage with a Kevorkian curette was not performed.   A sharp tenaculum was applied to the anterior lip of the cervix for stabilization.  A sterile uterine sound was used to sound the uterus to a depth of 6.5cm.  A Pipelle endometrial aspirator was used to sample the endometrium.  Sample was sent for pathologic examination.  Condition: Stable  Complications: None  Plan:  The patient was advised to call for any fever or for prolonged or severe pain or bleeding. She was advised to use OTC ibuprofen as needed for mild to moderate pain. She was advised to avoid vaginal intercourse for 48 hours or until the bleeding has completely stopped.  Attending Physician Documentation: I was present for or performed the following: endometrial biopsy

## 2017-12-17 DIAGNOSIS — I10 Essential (primary) hypertension: Secondary | ICD-10-CM | POA: Diagnosis not present

## 2017-12-17 DIAGNOSIS — E782 Mixed hyperlipidemia: Secondary | ICD-10-CM | POA: Diagnosis not present

## 2017-12-17 DIAGNOSIS — R7301 Impaired fasting glucose: Secondary | ICD-10-CM | POA: Diagnosis not present

## 2017-12-19 DIAGNOSIS — R7301 Impaired fasting glucose: Secondary | ICD-10-CM | POA: Diagnosis not present

## 2017-12-19 DIAGNOSIS — I10 Essential (primary) hypertension: Secondary | ICD-10-CM | POA: Diagnosis not present

## 2017-12-19 DIAGNOSIS — R7303 Prediabetes: Secondary | ICD-10-CM | POA: Diagnosis not present

## 2017-12-19 DIAGNOSIS — D72829 Elevated white blood cell count, unspecified: Secondary | ICD-10-CM | POA: Diagnosis not present

## 2017-12-19 DIAGNOSIS — E782 Mixed hyperlipidemia: Secondary | ICD-10-CM | POA: Diagnosis not present

## 2017-12-20 ENCOUNTER — Ambulatory Visit: Payer: Medicare Other | Admitting: Obstetrics & Gynecology

## 2017-12-20 ENCOUNTER — Encounter: Payer: Self-pay | Admitting: Obstetrics & Gynecology

## 2017-12-20 ENCOUNTER — Other Ambulatory Visit: Payer: Self-pay

## 2017-12-20 VITALS — BP 132/76 | HR 69 | Ht 62.0 in | Wt 177.0 lb

## 2017-12-20 DIAGNOSIS — N8501 Benign endometrial hyperplasia: Secondary | ICD-10-CM | POA: Diagnosis not present

## 2017-12-20 MED ORDER — MEGESTROL ACETATE 20 MG PO TABS: ORAL_TABLET | ORAL | 11 refills | Status: DC

## 2017-12-20 NOTE — Progress Notes (Signed)
Chief Complaint  Patient presents with  . Follow-up    discuss results      72 y.o. G2P0 No LMP recorded. Patient is postmenopausal. The current method of family planning is post menopausal status.  Outpatient Encounter Medications as of 12/20/2017  Medication Sig  . apixaban (ELIQUIS) 5 MG TABS tablet Take 1 tablet (5 mg total) by mouth 2 (two) times daily.  Marland Kitchen diltiazem (CARDIZEM CD) 120 MG 24 hr capsule   . losartan-hydrochlorothiazide (HYZAAR) 100-12.5 MG tablet Take 1 tablet by mouth daily.  . megestrol (MEGACE) 20 MG tablet Take 1 tablet twice a day  . [DISCONTINUED] megestrol (MEGACE) 20 MG tablet Take 2 tablets (40 mg total) by mouth daily.  Marland Kitchen diltiazem (CARDIZEM CD) 120 MG 24 hr capsule Take 1 capsule (120 mg total) by mouth daily.   No facility-administered encounter medications on file as of 12/20/2017.     Subjective Diagnosed >10 years ago by me with CAH w/o atypia on chronic low dose megestrol Had some spotting red brown No pain EMB performed by me: polyps otherwise benign endometrium Past Medical History:  Diagnosis Date  . Atrial fibrillation ([place])    In the setting of influenza  . Essential hypertension, benign   . Fibromyalgia     Past Surgical History:  Procedure Laterality Date  . CESAREAN SECTION    . COLONOSCOPY N/A 05/28/2014   Procedure: COLONOSCOPY;  Surgeon: Danie Binder, MD;  [facility];  Service: Endoscopy;  Laterality: N/A;  8:30 AM  . HYSTEROSCOPY W/D&C N/A 11/07/2016   Procedure: DILATATION AND CURETTAGE /HYSTEROSCOPY;  Surgeon: Florian Buff, MD;  Location: [facility];  Service: Gynecology;  Laterality: N/A;    OB History    Gravida Para Term Preterm AB Living   2             SAB TAB Ectopic Multiple Live Births                  Allergies  Allergen Reactions  . Hydrocodone Itching    Social History   Socioeconomic History  . Marital status: Married    Spouse name: None  . Number of children: None  . Years of  education: None  . Highest education level: None  Social Needs  . Financial resource strain: None  . Food insecurity - worry: None  . Food insecurity - inability: None  . Transportation needs - medical: None  . Transportation needs - non-medical: None  Occupational History  . None  Tobacco Use  . Smoking status: Never Smoker  . Smokeless tobacco: Never Used  Substance and Sexual Activity  . Alcohol use: No    Alcohol/week: 0.0 oz  . Drug use: No  . Sexual activity: Not Currently  Other Topics Concern  . None  Social History Narrative  . None    Family History  Problem Relation Age of Onset  . Heart attack Father     Medications:       Current Outpatient Medications:  .  apixaban (ELIQUIS) 5 MG TABS tablet, Take 1 tablet (5 mg total) by mouth 2 (two) times daily., Disp: 180 tablet, Rfl: 3 .  diltiazem (CARDIZEM CD) 120 MG 24 hr capsule, , Disp: , Rfl: 3 .  losartan-hydrochlorothiazide (HYZAAR) 100-12.5 MG tablet, Take 1 tablet by mouth daily., Disp: 180 tablet, Rfl: 3 .  megestrol (MEGACE) 20 MG tablet, Take 1 tablet twice a day, Disp: 60 tablet, Rfl: 11 .  diltiazem (CARDIZEM CD) 120 MG 24 hr capsule, Take 1 capsule (120 mg total) by mouth daily., Disp: 90 capsule, Rfl: 3  Objective Blood pressure 132/76, pulse 69, height 5\' 2"  (1.575 m), weight 177 lb (80.3 kg).  Gen WDWN NAD  Pertinent ROS   Labs or studies EMG benign polyps    Impression Diagnoses this Encounter::   ICD-10-CM   1. Complex endometrial hyperplasia without atypia N85.01     Established relevant diagnosis(es): History of CAH w/o atypia  Plan/Recommendations: Meds ordered this encounter  Medications  . megestrol (MEGACE) 20 MG tablet    Sig: Take 1 tablet twice a day    Dispense:  60 tablet    Refill:  11    Labs or Scans Ordered: No orders of the defined types were placed in this encounter.   Management:: Continue megestrol 20 mg twice daily for treatment of complex endometrial  hyperplasia  Follow up Return in about 1 year (around 12/20/2018) for Follow up, with Dr Elonda Husky.        Face to face time:  10 minutes  Greater than 50% of the visit time was spent in counseling and coordination of care with the patient.  The summary and outline of the counseling and care coordination is summarized in the note above.   All questions were answered.

## 2017-12-27 ENCOUNTER — Encounter: Payer: Self-pay | Admitting: Cardiology

## 2017-12-27 NOTE — Progress Notes (Signed)
Cardiology Office Note  Date: 12/30/2017   ID: Laryah, Neuser [birth year], MRN [id number]  PCP: Celene Squibb, MD  Primary Cardiologist: Rozann Lesches, MD   Chief Complaint  Patient presents with  . PAF    History of Present Illness: SABRIYAH WILCHER is a 72 y.o. female last seen by [name] DNP in August 2018.  She is here today with her daughter for a follow-up visit.  She states that her husband passed away back in 09/19/2017.  She seems to be doing reasonably well at this point.  She does not report any chest pain, no increasing shortness of breath with activities, no palpitations.  She has had no syncope.  CHADSVASC score 3.  She states that she just recently had lab work per Dr. [name] which we will request for review.  She does not report any bleeding problems.  I personally reviewed her ECG today which shows sinus rhythm with short PR interval and nonspecific ST changes.  She continues on Cardizem CD.  Past Medical History:  Diagnosis Date  . Essential hypertension, benign   . Fibromyalgia   . Paroxysmal atrial fibrillation [facility])     Past Surgical History:  Procedure Laterality Date  . CESAREAN SECTION    . COLONOSCOPY N/A 05/28/2014   Procedure: COLONOSCOPY;  Surgeon: Danie Binder, MD;  [facility];  Service: Endoscopy;  Laterality: N/A;  8:30 AM  . HYSTEROSCOPY W/D&C N/A 11/07/2016   Procedure: DILATATION AND CURETTAGE /HYSTEROSCOPY;  Surgeon: Florian Buff, MD;  Location: [facility];  Service: Gynecology;  Laterality: N/A;    Current Outpatient Medications  Medication Sig Dispense Refill  . apixaban (ELIQUIS) 5 MG TABS tablet Take 1 tablet (5 mg total) by mouth 2 (two) times daily. 180 tablet 3  . diltiazem (CARDIZEM CD) 120 MG 24 hr capsule Take 1 capsule (120 mg total) by mouth daily. 90 capsule 3  . diltiazem (CARDIZEM CD) 120 MG 24 hr capsule   3  . losartan-hydrochlorothiazide (HYZAAR) 100-12.5 MG tablet Take 1 tablet by mouth daily. 180  tablet 3  . megestrol (MEGACE) 20 MG tablet Take 1 tablet twice a day 60 tablet 11   No current facility-administered medications for this visit.    Allergies:  Hydrocodone   Social History: The patient  reports that  has never smoked. she has never used smokeless tobacco. She reports that she does not drink alcohol or use drugs.   ROS:  Please see the history of present illness. Otherwise, complete review of systems is positive for none.  All other systems are reviewed and negative.   Physical Exam: VS:  BP (!) 162/72   Pulse 60   Ht 5\' 2"  (1.575 m)   Wt 177 lb 6.4 oz (80.5 kg)   SpO2 98%   BMI 32.45 kg/m , BMI Body mass index is 32.45 kg/m.  Wt Readings from Last 3 Encounters:  12/30/17 177 lb 6.4 oz (80.5 kg)  12/20/17 177 lb (80.3 kg)  12/12/17 176 lb (79.8 kg)    General: Elderly woman, appears comfortable at rest. HEENT: Conjunctiva and lids normal, oropharynx clear. Neck: Supple, no elevated JVP or carotid bruits, no thyromegaly. Lungs: Clear to auscultation, nonlabored breathing at rest. Cardiac: Regular rate and rhythm, no S3, soft systolic murmur, no pericardial rub. Abdomen: Soft, nontender, bowel sounds present. Extremities: Trace ankle edema, distal pulses 2+. Skin: Warm and dry. Musculoskeletal: No kyphosis. Neuropsychiatric: Alert and oriented x3, affect grossly  appropriate.  ECG: I personally reviewed the tracing from 10/12/2016 which showed sinus bradycardia with increased voltage and repolarization changes.  Recent Labwork: 06/10/2017: BUN 16; Creatinine, Ser 1.10; Hemoglobin 12.7; Platelets 264; Potassium 4.1; Sodium 139   Other Studies Reviewed Today:  Echocardiogram 03/29/2016: Study Conclusions  - Left ventricle: The cavity size was normal. Wall thickness was   increased in a pattern of mild LVH. Systolic function was normal.   The estimated ejection fraction was in the range of 55% to 60%.   Wall motion was normal; there were no regional wall  motion   abnormalities. Left ventricular diastolic function parameters   were normal for the patient&'s age. - Aortic valve: Mildly calcified annulus. Trileaflet. - Mitral valve: There was mild regurgitation. - Left atrium: The atrium was mildly dilated. - Right atrium: Central venous pressure (est): 3 mm Hg. - Tricuspid valve: There was trivial regurgitation. - Pulmonary arteries: Systolic pressure could not be accurately   estimated. - Pericardium, extracardiac: A small pericardial effusion was   identified posterior to the heart.  Impressions:  - Mild LVH with LVEF 55-60% and grossly normal diastolic function.   Mild left atrial enlargement. Mild mitral regurgitation. Mildly   sclerotic aortic annulus. Trivial tricuspid regurgitation. Small   posterior pericardial effusion.  Assessment and Plan:  1.  Paroxysmal atrial fibrillation, maintaining sinus rhythm by follow-up ECG today and without progressive palpitations.  Continue Cardizem CD and Eliquis.  No recent bleeding problems.  We are requesting her most recent lab work from Dr. [name].  2.  Essential hypertension, blood pressure elevated today.  She is also on Hyzaar.  We discussed salt restriction and a walking regimen.  Keep follow-up with Dr. [name].  3.  History of small posterior pericardial effusion, asymptomatic.  No rub on examination.  Current medicines were reviewed with the patient today.   Orders Placed This Encounter  Procedures  . EKG 12-Lead    Disposition: Follow-up in 6 months.  Signed, Satira Sark, MD, [facility] 12/30/2017 8:56 AM    [place] [facility] [place]. [address], [place], [place] [address] Phone: [phone]; Fax: [phone]

## 2017-12-30 ENCOUNTER — Ambulatory Visit: Payer: Medicare Other | Admitting: Cardiology

## 2017-12-30 ENCOUNTER — Encounter: Payer: Self-pay | Admitting: Cardiology

## 2017-12-30 VITALS — BP 162/72 | HR 60 | Ht 62.0 in | Wt 177.4 lb

## 2017-12-30 DIAGNOSIS — I313 Pericardial effusion (noninflammatory): Secondary | ICD-10-CM

## 2017-12-30 DIAGNOSIS — I10 Essential (primary) hypertension: Secondary | ICD-10-CM

## 2017-12-30 DIAGNOSIS — I3139 Other pericardial effusion (noninflammatory): Secondary | ICD-10-CM

## 2017-12-30 DIAGNOSIS — I480 Paroxysmal atrial fibrillation: Secondary | ICD-10-CM

## 2017-12-30 NOTE — Patient Instructions (Signed)
Your physician wants you to follow-up in:6 months with Dr.McDowell You will receive a reminder letter in the mail two months in advance. If you don't receive a letter, please call our office to schedule the follow-up appointment.   Your physician recommends that you continue on your current medications as directed. Please refer to the Current Medication list given to you today.   If you need a refill on your cardiac medications before your next appointment, please call your pharmacy.    No lab work or tests ordered today.      Thank you for choosing [facility] !         

## 2018-05-27 DIAGNOSIS — D72829 Elevated white blood cell count, unspecified: Secondary | ICD-10-CM | POA: Diagnosis not present

## 2018-05-27 DIAGNOSIS — I482 Chronic atrial fibrillation: Secondary | ICD-10-CM | POA: Diagnosis not present

## 2018-05-27 DIAGNOSIS — R55 Syncope and collapse: Secondary | ICD-10-CM | POA: Diagnosis not present

## 2018-05-27 DIAGNOSIS — R7301 Impaired fasting glucose: Secondary | ICD-10-CM | POA: Diagnosis not present

## 2018-05-27 DIAGNOSIS — I4891 Unspecified atrial fibrillation: Secondary | ICD-10-CM | POA: Diagnosis not present

## 2018-05-27 DIAGNOSIS — Z6826 Body mass index (BMI) 26.0-26.9, adult: Secondary | ICD-10-CM | POA: Diagnosis not present

## 2018-05-27 DIAGNOSIS — R7303 Prediabetes: Secondary | ICD-10-CM | POA: Diagnosis not present

## 2018-05-27 DIAGNOSIS — E782 Mixed hyperlipidemia: Secondary | ICD-10-CM | POA: Diagnosis not present

## 2018-05-27 DIAGNOSIS — Z8679 Personal history of other diseases of the circulatory system: Secondary | ICD-10-CM | POA: Diagnosis not present

## 2018-05-27 DIAGNOSIS — Z6824 Body mass index (BMI) 24.0-24.9, adult: Secondary | ICD-10-CM | POA: Diagnosis not present

## 2018-05-27 DIAGNOSIS — I10 Essential (primary) hypertension: Secondary | ICD-10-CM | POA: Diagnosis not present

## 2018-05-29 DIAGNOSIS — I10 Essential (primary) hypertension: Secondary | ICD-10-CM | POA: Diagnosis not present

## 2018-05-29 DIAGNOSIS — D72829 Elevated white blood cell count, unspecified: Secondary | ICD-10-CM | POA: Diagnosis not present

## 2018-05-29 DIAGNOSIS — N189 Chronic kidney disease, unspecified: Secondary | ICD-10-CM | POA: Diagnosis not present

## 2018-05-29 DIAGNOSIS — N939 Abnormal uterine and vaginal bleeding, unspecified: Secondary | ICD-10-CM | POA: Diagnosis not present

## 2018-05-29 DIAGNOSIS — R7303 Prediabetes: Secondary | ICD-10-CM | POA: Diagnosis not present

## 2018-05-29 DIAGNOSIS — E782 Mixed hyperlipidemia: Secondary | ICD-10-CM | POA: Diagnosis not present

## 2018-05-29 DIAGNOSIS — Z6832 Body mass index (BMI) 32.0-32.9, adult: Secondary | ICD-10-CM | POA: Diagnosis not present

## 2018-06-27 ENCOUNTER — Other Ambulatory Visit: Payer: Self-pay | Admitting: Adult Health

## 2018-06-28 ENCOUNTER — Other Ambulatory Visit: Payer: Self-pay | Admitting: Adult Health

## 2018-06-30 ENCOUNTER — Other Ambulatory Visit: Payer: Self-pay

## 2018-06-30 NOTE — Patient Outreach (Signed)
[place] [facility]) Care Management  06/30/2018  CAILIE BOSSHART February 25, 1946 [id number]   Referral Date: 06/30/18 Referral Source: HTA Concierge Referral Reason: In coverage Gap and needing assistance  Outreach Attempt: No answer. HIPAA compliant voice message left.     Plan: RN CM will send letter and attempt patient again within 4 business days.   [name], RN, MSN [facility] Care Management Care Management Coordinator Direct Line [phone] Toll Free: [phone]  Fax: [phone]

## 2018-07-01 ENCOUNTER — Other Ambulatory Visit: Payer: Self-pay

## 2018-07-01 NOTE — Patient Outreach (Signed)
[place] [facility]) Care Management  07/01/2018  Vanessa Anderson [birth year] [id number]   Referral Date: 06/30/18 Referral Source: HTA Concierge Referral Reason: In coverage Gap and needing assistance  Outreach Attempt: No answer. HIPAA compliant voice message left.     Plan: RN CM will attempt patient again within 4 business days.    [name], RN, MSN [place] Management Care Management Coordinator Direct Line [phone] Cell [phone] Toll Free: [phone]  Fax: [phone]

## 2018-07-02 ENCOUNTER — Other Ambulatory Visit: Payer: Self-pay

## 2018-07-02 NOTE — Patient Outreach (Signed)
[place] [facility]) Care Management  07/02/2018  Vanessa Anderson Jun 14, 1946 [id number]   Referral Date:06/30/18 Referral Source:HTA Concierge Referral Reason:In coverage Gap and needing assistance  Outreach Attempt:Patient spoke with patient.  She states she on another line long distance.  CM identified herself and requested patient to call CM back.  She states that she will call back.    Plan: RN CM will wait return call from patient if no return call will close case.  [name], RN, MSN [place] Management Care Management Coordinator Direct Line [phone] Cell [phone] Toll Free: [phone]  Fax: [phone]

## 2018-07-03 ENCOUNTER — Ambulatory Visit: Payer: Self-pay

## 2018-07-08 ENCOUNTER — Encounter: Payer: Self-pay | Admitting: Cardiology

## 2018-07-08 ENCOUNTER — Ambulatory Visit: Payer: PPO | Admitting: Cardiology

## 2018-07-08 VITALS — BP 156/76 | HR 76 | Ht 62.0 in | Wt 187.0 lb

## 2018-07-08 DIAGNOSIS — I480 Paroxysmal atrial fibrillation: Secondary | ICD-10-CM | POA: Diagnosis not present

## 2018-07-08 DIAGNOSIS — I313 Pericardial effusion (noninflammatory): Secondary | ICD-10-CM | POA: Diagnosis not present

## 2018-07-08 DIAGNOSIS — I10 Essential (primary) hypertension: Secondary | ICD-10-CM | POA: Diagnosis not present

## 2018-07-08 DIAGNOSIS — I3139 Other pericardial effusion (noninflammatory): Secondary | ICD-10-CM

## 2018-07-08 NOTE — Patient Instructions (Addendum)
Your physician wants you to follow-up in:6 months with Dr.McDowell You will receive a reminder letter in the mail two months in advance. If you don't receive a letter, please call our office to schedule the follow-up appointment.    Your physician recommends that you continue on your current medications as directed. Please refer to the Current Medication list given to you today.    If you need a refill on your cardiac medications before your next appointment, please call your pharmacy.     No labs or tests today.       Thank you for choosing [facility] !        

## 2018-07-08 NOTE — Progress Notes (Signed)
Cardiology Office Note  Date: 07/08/2018   ID: Gracelynn, Bircher [birth year], MRN [id number]  PCP: Satira Sark, MD  Primary Cardiologist: Rozann Lesches, MD   Chief Complaint  Patient presents with  . PAF    History of Present Illness: Vanessa Anderson is a 72 y.o. female last seen in February.  She is here for a routine visit.  Since last assessment she does not report any palpitations or chest pain.  No ER visits or other major health changes.  She continues to follow with Dr. [name], reports intermittent lab work which we will request.  She does not report any bleeding problems on Eliquis, no stool changes.  She also remains on Cardizem CD.  She has had no pleuritic thoracic discomfort, no increasing shortness of breath or orthopnea.  No lightheadedness or syncope.   Past Medical History:  Diagnosis Date  . Essential hypertension, benign   . Fibromyalgia   . Paroxysmal atrial fibrillation [facility])     Past Surgical History:  Procedure Laterality Date  . CESAREAN SECTION    . COLONOSCOPY N/A 05/28/2014   Procedure: COLONOSCOPY;  Surgeon: Danie Binder, MD;  [facility];  Service: Endoscopy;  Laterality: N/A;  8:30 AM  . HYSTEROSCOPY W/D&C N/A 11/07/2016   Procedure: DILATATION AND CURETTAGE /HYSTEROSCOPY;  Surgeon: Florian Buff, MD;  Location: [facility];  Service: Gynecology;  Laterality: N/A;    Current Outpatient Medications  Medication Sig Dispense Refill  . diltiazem (CARDIZEM CD) 120 MG 24 hr capsule Take 1 capsule (120 mg total) by mouth daily. 90 capsule 3  . diltiazem (CARDIZEM CD) 120 MG 24 hr capsule   3  . ELIQUIS 5 MG TABS tablet TAKE 1 TABLET(5 MG) BY MOUTH TWICE DAILY 180 tablet 0  . losartan-hydrochlorothiazide (HYZAAR) 100-12.5 MG tablet TAKE 1 TABLET BY MOUTH DAILY 180 tablet 3  . megestrol (MEGACE) 20 MG tablet Take 1 tablet twice a day 60 tablet 11   No current facility-administered medications for this visit.    Allergies:   Hydrocodone   Social History: The patient  reports that she has never smoked. She has never used smokeless tobacco. She reports that she does not drink alcohol or use drugs.   ROS:  Please see the history of present illness. Otherwise, complete review of systems is positive for none.  All other systems are reviewed and negative.   Physical Exam: VS:  BP (!) 156/76 (BP Location: Right Arm)   Pulse 76   Ht 5\' 2"  (1.575 m)   Wt 187 lb (84.8 kg)   SpO2 96%   BMI 34.20 kg/m , BMI Body mass index is 34.2 kg/m.  Wt Readings from Last 3 Encounters:  07/08/18 187 lb (84.8 kg)  12/30/17 177 lb 6.4 oz (80.5 kg)  12/20/17 177 lb (80.3 kg)    General: Elderly woman, appears comfortable at rest. HEENT: Conjunctiva and lids normal, oropharynx clear. Neck: Supple, no elevated JVP or carotid bruits, no thyromegaly. Lungs: Clear to auscultation, nonlabored breathing at rest. Cardiac: Regular rate and rhythm, no S3, soft systolic murmur. Abdomen: Soft, nontender, bowel sounds present. Extremities: Trace ankle edema, distal pulses 2+. Skin: Warm and dry. Musculoskeletal: No kyphosis. Neuropsychiatric: Alert and oriented x3, affect grossly appropriate.  ECG: I personally reviewed the tracing from 12/30/2017 which showed sinus rhythm with short PR interval and nonspecific ST changes.  Recent Labwork:  06/10/2017: BUN 16; Creatinine, Ser 1.10; Hemoglobin 12.7; Platelets 264; Potassium 4.1;  Sodium 139   Other Studies Reviewed Today:  Echocardiogram 03/29/2016: Study Conclusions  - Left ventricle: The cavity size was normal. Wall thickness was   increased in a pattern of mild LVH. Systolic function was normal.   The estimated ejection fraction was in the range of 55% to 60%.   Wall motion was normal; there were no regional wall motion   abnormalities. Left ventricular diastolic function parameters   were normal for the patient&'s age. - Aortic valve: Mildly calcified annulus. Trileaflet. -  Mitral valve: There was mild regurgitation. - Left atrium: The atrium was mildly dilated. - Right atrium: Central venous pressure (est): 3 mm Hg. - Tricuspid valve: There was trivial regurgitation. - Pulmonary arteries: Systolic pressure could not be accurately   estimated. - Pericardium, extracardiac: A small pericardial effusion was   identified posterior to the heart.  Impressions:  - Mild LVH with LVEF 55-60% and grossly normal diastolic function.   Mild left atrial enlargement. Mild mitral regurgitation. Mildly   sclerotic aortic annulus. Trivial tricuspid regurgitation. Small   posterior pericardial effusion.  Assessment and Plan:  1.  Paroxysmal atrial fibrillation with CHADSVASC score of 3.  She is asymptomatic in terms of palpitations and continues on Cardizem CD as well as Eliquis.  Requesting interval lab work from Dr. [name].  No changes made today.  2.  Essential hypertension, blood pressure mildly elevated today.  She reports compliance with Hyzaar as well and continues to follow with Dr. [name].  3.  History of small posterior pericardial effusion, no active symptoms or change in examination.  No clear indication for echocardiogram at this time.  Current medicines were reviewed with the patient today.  Disposition: Follow-up in 6 months.  Signed, Satira Sark, MD, [facility] 07/08/2018 1:24 PM    [place] at [facility] [address] [place],  [address] Phone: [phone]; Fax: [phone]

## 2018-07-14 ENCOUNTER — Other Ambulatory Visit: Payer: Self-pay

## 2018-07-14 NOTE — Patient Outreach (Signed)
[place] [facility]) Care Management  07/14/2018  LEA BAINE 10-18-1946 [id number]   Multiple attempts to establish contact with patient without success. No response from letter mailed to patient.   Plan: RN CM will close case at this time.   [name], RN, MSN [place] Management Care Management Coordinator Direct Line [phone] Cell [phone] Toll Free: [phone]  Fax: [phone]

## 2018-07-23 DIAGNOSIS — Z23 Encounter for immunization: Secondary | ICD-10-CM | POA: Diagnosis not present

## 2018-07-29 DIAGNOSIS — I482 Chronic atrial fibrillation: Secondary | ICD-10-CM | POA: Diagnosis not present

## 2018-07-29 DIAGNOSIS — R7303 Prediabetes: Secondary | ICD-10-CM | POA: Diagnosis not present

## 2018-07-29 DIAGNOSIS — N189 Chronic kidney disease, unspecified: Secondary | ICD-10-CM | POA: Diagnosis not present

## 2018-07-29 DIAGNOSIS — E782 Mixed hyperlipidemia: Secondary | ICD-10-CM | POA: Diagnosis not present

## 2018-07-29 DIAGNOSIS — I10 Essential (primary) hypertension: Secondary | ICD-10-CM | POA: Diagnosis not present

## 2018-07-29 DIAGNOSIS — I4891 Unspecified atrial fibrillation: Secondary | ICD-10-CM | POA: Diagnosis not present

## 2018-07-29 DIAGNOSIS — R7301 Impaired fasting glucose: Secondary | ICD-10-CM | POA: Diagnosis not present

## 2018-07-29 DIAGNOSIS — N390 Urinary tract infection, site not specified: Secondary | ICD-10-CM | POA: Diagnosis not present

## 2018-07-29 DIAGNOSIS — Z8679 Personal history of other diseases of the circulatory system: Secondary | ICD-10-CM | POA: Diagnosis not present

## 2018-08-05 DIAGNOSIS — I482 Chronic atrial fibrillation, unspecified: Secondary | ICD-10-CM | POA: Diagnosis not present

## 2018-08-05 DIAGNOSIS — N189 Chronic kidney disease, unspecified: Secondary | ICD-10-CM | POA: Diagnosis not present

## 2018-08-05 DIAGNOSIS — I4891 Unspecified atrial fibrillation: Secondary | ICD-10-CM | POA: Diagnosis not present

## 2018-08-05 DIAGNOSIS — E782 Mixed hyperlipidemia: Secondary | ICD-10-CM | POA: Diagnosis not present

## 2018-08-05 DIAGNOSIS — R7301 Impaired fasting glucose: Secondary | ICD-10-CM | POA: Diagnosis not present

## 2018-08-05 DIAGNOSIS — R7303 Prediabetes: Secondary | ICD-10-CM | POA: Diagnosis not present

## 2018-08-05 DIAGNOSIS — Z6829 Body mass index (BMI) 29.0-29.9, adult: Secondary | ICD-10-CM | POA: Diagnosis not present

## 2018-08-05 DIAGNOSIS — Z0000 Encounter for general adult medical examination without abnormal findings: Secondary | ICD-10-CM | POA: Diagnosis not present

## 2018-08-05 DIAGNOSIS — I10 Essential (primary) hypertension: Secondary | ICD-10-CM | POA: Diagnosis not present

## 2018-08-06 ENCOUNTER — Telehealth: Payer: Self-pay | Admitting: *Deleted

## 2018-08-06 NOTE — Telephone Encounter (Signed)
Pt is taking Megace and complaining of increased sweating. What do you advise? Thanks!! [place]

## 2018-08-06 NOTE — Telephone Encounter (Signed)
Sorry...but the answer is the same, she has been on it for almost 2 years She needs to be on it for endometrial pathology and there is not an acceptable therapeutic alternative

## 2018-08-06 NOTE — Telephone Encounter (Signed)
The patient has been on megestrol since 11/2016 so there would not be any change in her swelling due to the med  She is on it for management of complex atypical hyperplasia  If she has swelling that her PCP feels is significant then I would consider a diuretic for management  The low dose megestrol needs to continue

## 2018-08-06 NOTE — Telephone Encounter (Signed)
Spoke with Cordelia Pen from Dr. Juel Burrow office giving her Dr. Brynda Greathouse recommendations. Cordelia Pen will relay message to pt. [place]

## 2018-08-13 DIAGNOSIS — N390 Urinary tract infection, site not specified: Secondary | ICD-10-CM | POA: Diagnosis not present

## 2018-09-01 ENCOUNTER — Other Ambulatory Visit: Payer: Self-pay | Admitting: Adult Health

## 2018-09-16 DIAGNOSIS — R7303 Prediabetes: Secondary | ICD-10-CM | POA: Diagnosis not present

## 2018-09-16 DIAGNOSIS — I4891 Unspecified atrial fibrillation: Secondary | ICD-10-CM | POA: Diagnosis not present

## 2018-09-16 DIAGNOSIS — E782 Mixed hyperlipidemia: Secondary | ICD-10-CM | POA: Diagnosis not present

## 2018-09-16 DIAGNOSIS — I10 Essential (primary) hypertension: Secondary | ICD-10-CM | POA: Diagnosis not present

## 2018-09-16 DIAGNOSIS — N189 Chronic kidney disease, unspecified: Secondary | ICD-10-CM | POA: Diagnosis not present

## 2018-09-16 DIAGNOSIS — R7301 Impaired fasting glucose: Secondary | ICD-10-CM | POA: Diagnosis not present

## 2018-10-16 DIAGNOSIS — R7301 Impaired fasting glucose: Secondary | ICD-10-CM | POA: Diagnosis not present

## 2018-10-16 DIAGNOSIS — I482 Chronic atrial fibrillation, unspecified: Secondary | ICD-10-CM | POA: Diagnosis not present

## 2018-10-16 DIAGNOSIS — I10 Essential (primary) hypertension: Secondary | ICD-10-CM | POA: Diagnosis not present

## 2018-10-16 DIAGNOSIS — E782 Mixed hyperlipidemia: Secondary | ICD-10-CM | POA: Diagnosis not present

## 2018-11-14 DIAGNOSIS — I10 Essential (primary) hypertension: Secondary | ICD-10-CM | POA: Diagnosis not present

## 2018-11-14 DIAGNOSIS — I482 Chronic atrial fibrillation, unspecified: Secondary | ICD-10-CM | POA: Diagnosis not present

## 2018-11-14 DIAGNOSIS — R7303 Prediabetes: Secondary | ICD-10-CM | POA: Diagnosis not present

## 2018-11-14 DIAGNOSIS — E782 Mixed hyperlipidemia: Secondary | ICD-10-CM | POA: Diagnosis not present

## 2018-11-14 DIAGNOSIS — N189 Chronic kidney disease, unspecified: Secondary | ICD-10-CM | POA: Diagnosis not present

## 2018-12-01 DIAGNOSIS — J060 Acute laryngopharyngitis: Secondary | ICD-10-CM | POA: Diagnosis not present

## 2018-12-01 DIAGNOSIS — J00 Acute nasopharyngitis [common cold]: Secondary | ICD-10-CM | POA: Diagnosis not present

## 2018-12-03 DIAGNOSIS — Z6832 Body mass index (BMI) 32.0-32.9, adult: Secondary | ICD-10-CM | POA: Diagnosis not present

## 2018-12-03 DIAGNOSIS — R7303 Prediabetes: Secondary | ICD-10-CM | POA: Diagnosis not present

## 2018-12-03 DIAGNOSIS — R7301 Impaired fasting glucose: Secondary | ICD-10-CM | POA: Diagnosis not present

## 2018-12-03 DIAGNOSIS — D72829 Elevated white blood cell count, unspecified: Secondary | ICD-10-CM | POA: Diagnosis not present

## 2018-12-03 DIAGNOSIS — E782 Mixed hyperlipidemia: Secondary | ICD-10-CM | POA: Diagnosis not present

## 2018-12-03 DIAGNOSIS — I10 Essential (primary) hypertension: Secondary | ICD-10-CM | POA: Diagnosis not present

## 2018-12-10 DIAGNOSIS — Z23 Encounter for immunization: Secondary | ICD-10-CM | POA: Diagnosis not present

## 2018-12-10 DIAGNOSIS — E669 Obesity, unspecified: Secondary | ICD-10-CM | POA: Diagnosis not present

## 2018-12-10 DIAGNOSIS — R7303 Prediabetes: Secondary | ICD-10-CM | POA: Diagnosis not present

## 2018-12-10 DIAGNOSIS — Z7189 Other specified counseling: Secondary | ICD-10-CM | POA: Diagnosis not present

## 2018-12-10 DIAGNOSIS — Z6832 Body mass index (BMI) 32.0-32.9, adult: Secondary | ICD-10-CM | POA: Diagnosis not present

## 2018-12-10 DIAGNOSIS — N939 Abnormal uterine and vaginal bleeding, unspecified: Secondary | ICD-10-CM | POA: Diagnosis not present

## 2018-12-10 DIAGNOSIS — I129 Hypertensive chronic kidney disease with stage 1 through stage 4 chronic kidney disease, or unspecified chronic kidney disease: Secondary | ICD-10-CM | POA: Diagnosis not present

## 2018-12-10 DIAGNOSIS — I4891 Unspecified atrial fibrillation: Secondary | ICD-10-CM | POA: Diagnosis not present

## 2018-12-10 DIAGNOSIS — N183 Chronic kidney disease, stage 3 (moderate): Secondary | ICD-10-CM | POA: Diagnosis not present

## 2018-12-10 DIAGNOSIS — R7301 Impaired fasting glucose: Secondary | ICD-10-CM | POA: Diagnosis not present

## 2018-12-10 DIAGNOSIS — Z6829 Body mass index (BMI) 29.0-29.9, adult: Secondary | ICD-10-CM | POA: Diagnosis not present

## 2018-12-10 DIAGNOSIS — Z0000 Encounter for general adult medical examination without abnormal findings: Secondary | ICD-10-CM | POA: Diagnosis not present

## 2018-12-10 DIAGNOSIS — E782 Mixed hyperlipidemia: Secondary | ICD-10-CM | POA: Diagnosis not present

## 2018-12-10 DIAGNOSIS — N189 Chronic kidney disease, unspecified: Secondary | ICD-10-CM | POA: Diagnosis not present

## 2018-12-10 DIAGNOSIS — J060 Acute laryngopharyngitis: Secondary | ICD-10-CM | POA: Diagnosis not present

## 2018-12-10 DIAGNOSIS — J00 Acute nasopharyngitis [common cold]: Secondary | ICD-10-CM | POA: Diagnosis not present

## 2018-12-10 DIAGNOSIS — Z6826 Body mass index (BMI) 26.0-26.9, adult: Secondary | ICD-10-CM | POA: Diagnosis not present

## 2018-12-10 DIAGNOSIS — D72829 Elevated white blood cell count, unspecified: Secondary | ICD-10-CM | POA: Diagnosis not present

## 2018-12-10 DIAGNOSIS — N390 Urinary tract infection, site not specified: Secondary | ICD-10-CM | POA: Diagnosis not present

## 2018-12-26 ENCOUNTER — Encounter: Payer: Self-pay | Admitting: Obstetrics & Gynecology

## 2018-12-26 ENCOUNTER — Ambulatory Visit: Payer: PPO | Admitting: Obstetrics & Gynecology

## 2018-12-26 VITALS — BP 128/58 | HR 70 | Ht 62.0 in | Wt 191.6 lb

## 2018-12-26 DIAGNOSIS — N8501 Benign endometrial hyperplasia: Secondary | ICD-10-CM

## 2018-12-26 MED ORDER — MEGESTROL ACETATE 20 MG PO TABS: ORAL_TABLET | ORAL | 11 refills | Status: DC

## 2018-12-26 NOTE — Progress Notes (Signed)
Chief Complaint  Patient presents with  . Follow-up x 1 year    endometrial hyperplasia      73 y.o. G2P0 No LMP recorded. Patient is postmenopausal. The current method of family planning is post menopausal status.  Outpatient Encounter Medications as of 12/26/2018  Medication Sig  . CARTIA XT 120 MG 24 hr capsule TAKE 1 CAPSULE(120 MG) BY MOUTH DAILY  . diltiazem (CARDIZEM CD) 120 MG 24 hr capsule   . ELIQUIS 5 MG TABS tablet TAKE 1 TABLET(5 MG) BY MOUTH TWICE DAILY  . losartan-hydrochlorothiazide (HYZAAR) 100-12.5 MG tablet TAKE 1 TABLET BY MOUTH DAILY  . megestrol (MEGACE) 20 MG tablet Take 1 tablet twice a day  . [DISCONTINUED] megestrol (MEGACE) 20 MG tablet Take 1 tablet twice a day   No facility-administered encounter medications on file as of 12/26/2018.     Subjective Pt has history of CAH w/o atypia of the endometrium diagnosed 2008 Subsequently she has had bening endometrial polyps She has been on megestrol for management since 2008 with no problems Pt has not experienced any bleeding at all  Past Medical History:  Diagnosis Date  . Essential hypertension, benign   . Fibromyalgia   . Paroxysmal atrial fibrillation [facility])     Past Surgical History:  Procedure Laterality Date  . CESAREAN SECTION    . COLONOSCOPY N/A 05/28/2014   Procedure: COLONOSCOPY;  Surgeon: Danie Binder, MD;  Location[facility];  Service: Endoscopy;  Laterality: N/A;  8:30 AM  . HYSTEROSCOPY W/D&C N/A 11/07/2016   Procedure: DILATATION AND CURETTAGE /HYSTEROSCOPY;  Surgeon: Florian Buff, MD;  Location: [facility] ORS;  Service: Gynecology;  Laterality: N/A;    OB History    Gravida  2   Para      Term      Preterm      AB      Living        SAB      TAB      Ectopic      Multiple      Live Births              Allergies  Allergen Reactions  . Hydrocodone Itching    Social History   Socioeconomic History  . Marital status: Married    Spouse name: Not on  file  . Number of children: 2  . Years of education: Not on file  . Highest education level: Not on file  Occupational History  . Not on file  Social Needs  . Financial resource strain: Not on file  . Food insecurity:    Worry: Not on file    Inability: Not on file  . Transportation needs:    Medical: Not on file    Non-medical: Not on file  Tobacco Use  . Smoking status: Never Smoker  . Smokeless tobacco: Never Used  Substance and Sexual Activity  . Alcohol use: No    Alcohol/week: 0.0 standard drinks  . Drug use: No  . Sexual activity: Not Currently  Lifestyle  . Physical activity:    Days per week: Not on file    Minutes per session: Not on file  . Stress: Not on file  Relationships  . Social connections:    Talks on phone: Not on file    Gets together: Not on file    Attends religious service: Not on file    Active member of club or organization: Not on file  Attends meetings of clubs or organizations: Not on file    Relationship status: Not on file  Other Topics Concern  . Not on file  Social History Narrative  . Not on file    Family History  Problem Relation Age of Onset  . Heart attack Father     Medications:       Current Outpatient Medications:  .  CARTIA XT 120 MG 24 hr capsule, TAKE 1 CAPSULE(120 MG) BY MOUTH DAILY, Disp: 90 capsule, Rfl: 1 .  diltiazem (CARDIZEM CD) 120 MG 24 hr capsule, , Disp: , Rfl: 3 .  ELIQUIS 5 MG TABS tablet, TAKE 1 TABLET(5 MG) BY MOUTH TWICE DAILY, Disp: 180 tablet, Rfl: 0 .  losartan-hydrochlorothiazide (HYZAAR) 100-12.5 MG tablet, TAKE 1 TABLET BY MOUTH DAILY, Disp: 180 tablet, Rfl: 3 .  megestrol (MEGACE) 20 MG tablet, Take 1 tablet twice a day, Disp: 60 tablet, Rfl: 11  Objective Blood pressure (!) 128/58, pulse 70, height 5\' 2"  (1.575 m), weight 191 lb 9.6 oz (86.9 kg).  Gen WDWN NAD  Pertinent ROS No burning with urination, frequency or urgency No nausea, vomiting or diarrhea Nor fever chills or other  constitutional symptoms   Labs or studies     Impression Diagnoses this Encounter::   ICD-10-CM   1. Complex endometrial hyperplasia without atypia N85.01    managed chronically on megestrol since 2008    Established relevant diagnosis(es):   Plan/Recommendations: Meds ordered this encounter  Medications  . megestrol (MEGACE) 20 MG tablet    Sig: Take 1 tablet twice a day    Dispense:  60 tablet    Refill:  11    Labs or Scans Ordered: No orders of the defined types were placed in this encounter.   Management:: >continue megestrol 20 mg daily for medical management of her CAH w/o atypia  Follow up Return in about 1 year (around 12/27/2019) for Follow up, with Dr Elonda Husky.        Face to face time:  10 minutes  Greater than 50% of the visit time was spent in counseling and coordination of care with the patient.  The summary and outline of the counseling and care coordination is summarized in the note above.   All questions were answered.

## 2019-01-01 ENCOUNTER — Other Ambulatory Visit (HOSPITAL_COMMUNITY): Payer: Self-pay | Admitting: Internal Medicine

## 2019-01-01 DIAGNOSIS — Z1231 Encounter for screening mammogram for malignant neoplasm of breast: Secondary | ICD-10-CM

## 2019-01-01 DIAGNOSIS — Z780 Asymptomatic menopausal state: Secondary | ICD-10-CM

## 2019-01-02 DIAGNOSIS — R7303 Prediabetes: Secondary | ICD-10-CM | POA: Diagnosis not present

## 2019-01-02 DIAGNOSIS — E782 Mixed hyperlipidemia: Secondary | ICD-10-CM | POA: Diagnosis not present

## 2019-01-02 DIAGNOSIS — R7301 Impaired fasting glucose: Secondary | ICD-10-CM | POA: Diagnosis not present

## 2019-01-02 DIAGNOSIS — I10 Essential (primary) hypertension: Secondary | ICD-10-CM | POA: Diagnosis not present

## 2019-01-02 DIAGNOSIS — I482 Chronic atrial fibrillation, unspecified: Secondary | ICD-10-CM | POA: Diagnosis not present

## 2019-01-02 DIAGNOSIS — I4891 Unspecified atrial fibrillation: Secondary | ICD-10-CM | POA: Diagnosis not present

## 2019-01-08 DIAGNOSIS — R7303 Prediabetes: Secondary | ICD-10-CM | POA: Diagnosis not present

## 2019-01-08 DIAGNOSIS — E782 Mixed hyperlipidemia: Secondary | ICD-10-CM | POA: Diagnosis not present

## 2019-01-08 DIAGNOSIS — I482 Chronic atrial fibrillation, unspecified: Secondary | ICD-10-CM | POA: Diagnosis not present

## 2019-01-08 DIAGNOSIS — I10 Essential (primary) hypertension: Secondary | ICD-10-CM | POA: Diagnosis not present

## 2019-01-14 ENCOUNTER — Ambulatory Visit (HOSPITAL_COMMUNITY)
Admission: RE | Admit: 2019-01-14 | Discharge: 2019-01-14 | Disposition: A | Discharge status: Home / Self Care | Payer: PPO | Source: Ambulatory Visit | Attending: Internal Medicine | Admitting: Internal Medicine

## 2019-01-14 ENCOUNTER — Other Ambulatory Visit: Payer: Self-pay

## 2019-01-14 DIAGNOSIS — Z780 Asymptomatic menopausal state: Secondary | ICD-10-CM

## 2019-01-14 DIAGNOSIS — M85851 Other specified disorders of bone density and structure, right thigh: Secondary | ICD-10-CM | POA: Diagnosis not present

## 2019-01-14 DIAGNOSIS — Z1231 Encounter for screening mammogram for malignant neoplasm of breast: Secondary | ICD-10-CM | POA: Diagnosis not present

## 2019-02-04 DIAGNOSIS — R7301 Impaired fasting glucose: Secondary | ICD-10-CM | POA: Diagnosis not present

## 2019-02-04 DIAGNOSIS — I4891 Unspecified atrial fibrillation: Secondary | ICD-10-CM | POA: Diagnosis not present

## 2019-02-04 DIAGNOSIS — I482 Chronic atrial fibrillation, unspecified: Secondary | ICD-10-CM | POA: Diagnosis not present

## 2019-02-04 DIAGNOSIS — R7303 Prediabetes: Secondary | ICD-10-CM | POA: Diagnosis not present

## 2019-02-04 DIAGNOSIS — E782 Mixed hyperlipidemia: Secondary | ICD-10-CM | POA: Diagnosis not present

## 2019-02-04 DIAGNOSIS — I10 Essential (primary) hypertension: Secondary | ICD-10-CM | POA: Diagnosis not present

## 2019-02-19 ENCOUNTER — Encounter (HOSPITAL_COMMUNITY): Payer: Self-pay | Admitting: Hematology

## 2019-02-19 ENCOUNTER — Inpatient Hospital Stay (HOSPITAL_COMMUNITY): Payer: PPO

## 2019-02-19 ENCOUNTER — Other Ambulatory Visit: Payer: Self-pay

## 2019-02-19 ENCOUNTER — Inpatient Hospital Stay (HOSPITAL_COMMUNITY): Payer: PPO | Attending: Hematology | Admitting: Hematology

## 2019-02-19 VITALS — BP 150/56 | HR 67 | Temp 99.1°F | Resp 18 | Wt 190.9 lb

## 2019-02-19 DIAGNOSIS — Z79899 Other long term (current) drug therapy: Secondary | ICD-10-CM | POA: Insufficient documentation

## 2019-02-19 DIAGNOSIS — I129 Hypertensive chronic kidney disease with stage 1 through stage 4 chronic kidney disease, or unspecified chronic kidney disease: Secondary | ICD-10-CM | POA: Insufficient documentation

## 2019-02-19 DIAGNOSIS — Z7901 Long term (current) use of anticoagulants: Secondary | ICD-10-CM

## 2019-02-19 DIAGNOSIS — N189 Chronic kidney disease, unspecified: Secondary | ICD-10-CM | POA: Diagnosis not present

## 2019-02-19 DIAGNOSIS — D72829 Elevated white blood cell count, unspecified: Secondary | ICD-10-CM | POA: Diagnosis not present

## 2019-02-19 DIAGNOSIS — I480 Paroxysmal atrial fibrillation: Secondary | ICD-10-CM | POA: Insufficient documentation

## 2019-02-19 LAB — COMPREHENSIVE METABOLIC PANEL
ALT: 22 U/L (ref 0–44)
AST: 16 U/L (ref 15–41)
Albumin: 4.3 g/dL (ref 3.5–5.0)
Alkaline Phosphatase: 126 U/L (ref 38–126)
Anion gap: 7 (ref 5–15)
BUN: 21 mg/dL (ref 8–23)
CO2: 24 mmol/L (ref 22–32)
Calcium: 10 mg/dL (ref 8.9–10.3)
Chloride: 108 mmol/L (ref 98–111)
Creatinine, Ser: 1.06 mg/dL — ABNORMAL HIGH (ref 0.44–1.00)
GFR calc Af Amer: >60 mL/min (ref >60)
GFR calc non Af Amer: 52 mL/min — ABNORMAL LOW (ref >60)
Glucose, Bld: 98 mg/dL (ref 70–99)
Potassium: 4 mmol/L (ref 3.5–5.1)
Sodium: 139 mmol/L (ref 135–145)
Total Bilirubin: 0.8 mg/dL (ref 0.3–1.2)
Total Protein: 8.3 g/dL — ABNORMAL HIGH (ref 6.5–8.1)

## 2019-02-19 LAB — CBC WITH DIFFERENTIAL/PLATELET
Abs Immature Granulocytes: 0.07 10*3/uL (ref 0.00–0.07)
Basophils Absolute: 0.1 10*3/uL (ref 0.0–0.1)
Basophils Relative: 1 %
Eosinophils Absolute: 0.1 10*3/uL (ref 0.0–0.5)
Eosinophils Relative: 1 %
HCT: 41.3 % (ref 36.0–46.0)
Hemoglobin: 12.8 g/dL (ref 12.0–15.0)
Immature Granulocytes: 1 %
Lymphocytes Relative: 20 %
Lymphs Abs: 2.9 10*3/uL (ref 0.7–4.0)
MCH: 28.3 pg (ref 26.0–34.0)
MCHC: 31 g/dL (ref 30.0–36.0)
MCV: 91.4 fL (ref 80.0–100.0)
Monocytes Absolute: 1.2 10*3/uL — ABNORMAL HIGH (ref 0.1–1.0)
Monocytes Relative: 8 %
Neutro Abs: 10 10*3/uL — ABNORMAL HIGH (ref 1.7–7.7)
Neutrophils Relative %: 69 %
Platelets: 294 10*3/uL (ref 150–400)
RBC: 4.52 MIL/uL (ref 3.87–5.11)
RDW: 13.9 % (ref 11.5–15.5)
WBC: 14.3 10*3/uL — ABNORMAL HIGH (ref 4.0–10.5)
nRBC: 0 % (ref 0.0–0.2)

## 2019-02-19 LAB — LACTATE DEHYDROGENASE: LDH: 131 U/L (ref 98–192)

## 2019-02-19 NOTE — Patient Instructions (Signed)
[place] at [facility] Discharge Instructions  You were seen today by Dr. Delton Coombes. He would like to get some blood work drawn today. He will see you back in 3 weeks for follow up.   Thank you for choosing [place] at [facility] to provide your oncology and hematology care.  To afford each patient quality time with our provider, please arrive at least 15 minutes before your scheduled appointment time.   If you have a lab appointment with the [facility] please come in thru the  Main Entrance and check in at the main information desk  You need to re-schedule your appointment should you arrive 10 or more minutes late.  We strive to give you quality time with our providers, and arriving late affects you and other patients whose appointments are after yours.  Also, if you no show three or more times for appointments you may be dismissed from the clinic at the providers discretion.     Again, thank you for choosing [facility].  Our hope is that these requests will decrease the amount of time that you wait before being seen by our physicians.       _____________________________________________________________  Should you have questions after your visit to [facility], please contact our office at [phone] between the hours of 8:00 a.m. and 4:30 p.m.  Voicemails left after 4:00 p.m. will not be returned until the following business day.  For prescription refill requests, have your pharmacy contact our office and allow 72 hours.    Cancer Center Support Programs:   > Cancer Support Group  2nd Tuesday of the month 1pm-2pm, Journey Room

## 2019-02-19 NOTE — Progress Notes (Signed)
CONSULT NOTE  Patient Care Team: Celene Squibb, MD as PCP - General (Internal Medicine)  CHIEF COMPLAINTS/PURPOSE OF CONSULTATION:  Elevated WBC  HISTORY OF PRESENTING ILLNESS:  Vanessa Anderson 73 y.o. female is seen in consultation for elevated white count. She was found to have abnormal CBC from Dr. Josue Hector office. She is here today alone. She stats that she lives at home with husband. She denies recent chest pain on exertion, shortness of breath on minimal exertion, pre-syncopal episodes, or palpitations. She had not noticed any recent bleeding such as epistaxis, hematuria or hematochezia. The patient denies over the counter NSAID ingestion. She is not on antiplatelets agents. Her last colonoscopy was 2017. She had no prior history or diagnosis of cancer. Her age appropriate screening programs are up-to-date. She denies any pica and eats a variety of diet. She never donated blood or received blood transfusion.   MEDICAL HISTORY:  Past Medical History:  Diagnosis Date  . Chronic kidney disease   . Essential hypertension, benign   . Fibromyalgia   . Hyperlipidemia   . Paroxysmal atrial fibrillation ([place])     SURGICAL HISTORY: Past Surgical History:  Procedure Laterality Date  . CESAREAN SECTION    . COLONOSCOPY N/A 05/28/2014   Procedure: COLONOSCOPY;  Surgeon: Danie Binder, MD;  [facility];  Service: Endoscopy;  Laterality: N/A;  8:30 AM  . HYSTEROSCOPY W/D&C N/A 11/07/2016   Procedure: DILATATION AND CURETTAGE /HYSTEROSCOPY;  Surgeon: Florian Buff, MD;  Location: [facility]S;  Service: Gynecology;  Laterality: N/A;    SOCIAL HISTORY: Social History   Socioeconomic History  . Marital status: Married    Spouse name: Not on file  . Number of children: 2  . Years of education: Not on file  . Highest education level: Not on file  Occupational History  . Not on file  Social Needs  . Financial resource strain: Not on file  . Food insecurity:    Worry: Not on  file    Inability: Not on file  . Transportation needs:    Medical: Not on file    Non-medical: Not on file  Tobacco Use  . Smoking status: Never Smoker  . Smokeless tobacco: Never Used  Substance and Sexual Activity  . Alcohol use: No    Alcohol/week: 0.0 standard drinks  . Drug use: No  . Sexual activity: Not Currently  Lifestyle  . Physical activity:    Days per week: Not on file    Minutes per session: Not on file  . Stress: Not on file  Relationships  . Social connections:    Talks on phone: Not on file    Gets together: Not on file    Attends religious service: Not on file    Active member of club or organization: Not on file    Attends meetings of clubs or organizations: Not on file    Relationship status: Not on file  . Intimate partner violence:    Fear of current or ex partner: Not on file    Emotionally abused: Not on file    Physically abused: Not on file    Forced sexual activity: Not on file  Other Topics Concern  . Not on file  Social History Narrative  . Not on file    FAMILY HISTORY: Family History  Problem Relation Age of Onset  . Heart attack Father     ALLERGIES:  is allergic to hydrocodone.  MEDICATIONS:  Current Outpatient  Medications  Medication Sig Dispense Refill  . atorvastatin (LIPITOR) 10 MG tablet     . Calcium Carbonate-Vitamin D (CALCIUM 500/VITAMIN D PO) Take 600 mg by mouth.    . CARTIA XT 120 MG 24 hr capsule TAKE 1 CAPSULE(120 MG) BY MOUTH DAILY 90 capsule 1  . ELIQUIS 5 MG TABS tablet TAKE 1 TABLET(5 MG) BY MOUTH TWICE DAILY 180 tablet 0  . losartan-hydrochlorothiazide (HYZAAR) 100-12.5 MG tablet TAKE 1 TABLET BY MOUTH DAILY 180 tablet 3  . megestrol (MEGACE) 20 MG tablet Take 1 tablet twice a day 60 tablet 11   No current facility-administered medications for this visit.     REVIEW OF SYSTEMS:   Constitutional: Denies fevers, chills or abnormal night sweats Eyes: Denies blurriness of vision, double vision or watery  eyes Ears, nose, mouth, throat, and face: Denies mucositis or sore throat Respiratory: Denies cough, dyspnea or wheezes Cardiovascular: Denies palpitation, chest discomfort or lower extremity swelling Gastrointestinal:  Denies nausea, heartburn or change in bowel habits Skin: Denies abnormal skin rashes Lymphatics: Denies new lymphadenopathy or easy bruising Neurological:Denies numbness, tingling or new weaknesses Behavioral/Psych: Mood is stable, no new changes  All other systems were reviewed with the patient and are negative.  PHYSICAL EXAMINATION: ECOG PERFORMANCE STATUS: 1 - Symptomatic but completely ambulatory  Vitals:   02/19/19 1346  BP: (!) 150/56  Pulse: 67  Resp: 18  Temp: 99.1 F (37.3 C)  SpO2: 99%   Filed Weights   02/19/19 1346  Weight: 190 lb 14.4 oz (86.6 kg)    GENERAL:alert, no distress and comfortable SKIN: skin color, texture, turgor are normal, no rashes or significant lesions EYES: normal, conjunctiva are pink and non-injected, sclera clear OROPHARYNX:no exudate, no erythema and lips, buccal mucosa, and tongue normal  NECK: supple, thyroid normal size, non-tender, without nodularity LYMPH:  no palpable lymphadenopathy in the cervical, axillary or inguinal LUNGS: clear to auscultation and percussion with normal breathing effort HEART: regular rate & rhythm and no murmurs and no lower extremity edema ABDOMEN:abdomen soft, non-tender and normal bowel sounds Musculoskeletal:no cyanosis of digits and no clubbing  PSYCH: alert & oriented x 3 with fluent speech NEURO: no focal motor/sensory deficits  LABORATORY DATA:  I have reviewed the data as listed  RADIOGRAPHIC STUDIES: I have personally reviewed the radiological images as listed and agreed with the findings in the report.  ASSESSMENT & PLAN:  Leukocytosis 1.  Leukocytosis: - Patient referred to our office for evaluation of leukocytosis.  She had leukocytosis since 2018. - CBC on 05/27/2018  shows mildly elevated white count of 10.9 with normal differential.  On 12/03/2018 white count was 13.5 with elevated neutrophils and lymphocytes.  On 12/10/2018 white count is 15.5 with ANC of 10.5 and ALC of 3.4. -Patient reports that she has had upper respiratory infection prior to labs on 12/10/2018 which lasted for 2 weeks. - Hemoglobin and platelet count were within normal limits. - Denies any fevers, night sweats or weight loss in the last 6 months. - Denies any prior history of splenectomy.  Denies any history of connective tissue disorders. -No family history of malignancies. - I will repeat her CBC and review her smear.  We will check an LDH level.  If the white count remains elevated, we will send further testing for Jak 2 V6 74F mutation as well as BCR/ABL by FISH.  If they are nonconclusive and she continues to have elevated white count, will send for flow cytometry. -I will see her back  in 3 weeks for follow-up to discuss the results.     All questions were answered. The patient knows to call the clinic with any problems, questions or concerns.      Derek Jack, MD 02/19/19 2:52 PM

## 2019-02-19 NOTE — Assessment & Plan Note (Signed)
1.  Leukocytosis: - Patient referred to our office for evaluation of leukocytosis.  She had leukocytosis since 2018. - CBC on 05/27/2018 shows mildly elevated white count of 10.9 with normal differential.  On 12/03/2018 white count was 13.5 with elevated neutrophils and lymphocytes.  On 12/10/2018 white count is 15.5 with ANC of 10.5 and ALC of 3.4. -Patient reports that she has had upper respiratory infection prior to labs on 12/10/2018 which lasted for 2 weeks. - Hemoglobin and platelet count were within normal limits. - Denies any fevers, night sweats or weight loss in the last 6 months. - Denies any prior history of splenectomy.  Denies any history of connective tissue disorders. -No family history of malignancies. - I will repeat her CBC and review her smear.  We will check an LDH level.  If the white count remains elevated, we will send further testing for Jak 2 V6 67F mutation as well as BCR/ABL by FISH.  If they are nonconclusive and she continues to have elevated white count, will send for flow cytometry. -I will see her back in 3 weeks for follow-up to discuss the results.

## 2019-03-12 ENCOUNTER — Other Ambulatory Visit: Payer: Self-pay

## 2019-03-12 ENCOUNTER — Ambulatory Visit (HOSPITAL_COMMUNITY): Payer: PPO | Admitting: Hematology

## 2019-03-12 ENCOUNTER — Inpatient Hospital Stay (HOSPITAL_COMMUNITY): Payer: PPO | Attending: Hematology

## 2019-03-12 DIAGNOSIS — Z7901 Long term (current) use of anticoagulants: Secondary | ICD-10-CM | POA: Insufficient documentation

## 2019-03-12 DIAGNOSIS — E785 Hyperlipidemia, unspecified: Secondary | ICD-10-CM | POA: Diagnosis not present

## 2019-03-12 DIAGNOSIS — Z79899 Other long term (current) drug therapy: Secondary | ICD-10-CM | POA: Diagnosis not present

## 2019-03-12 DIAGNOSIS — N189 Chronic kidney disease, unspecified: Secondary | ICD-10-CM | POA: Insufficient documentation

## 2019-03-12 DIAGNOSIS — D72829 Elevated white blood cell count, unspecified: Secondary | ICD-10-CM | POA: Diagnosis not present

## 2019-03-12 DIAGNOSIS — M797 Fibromyalgia: Secondary | ICD-10-CM | POA: Insufficient documentation

## 2019-03-12 DIAGNOSIS — I129 Hypertensive chronic kidney disease with stage 1 through stage 4 chronic kidney disease, or unspecified chronic kidney disease: Secondary | ICD-10-CM | POA: Diagnosis not present

## 2019-03-12 DIAGNOSIS — I480 Paroxysmal atrial fibrillation: Secondary | ICD-10-CM | POA: Diagnosis not present

## 2019-03-16 DIAGNOSIS — I10 Essential (primary) hypertension: Secondary | ICD-10-CM | POA: Diagnosis not present

## 2019-03-16 DIAGNOSIS — I4891 Unspecified atrial fibrillation: Secondary | ICD-10-CM | POA: Diagnosis not present

## 2019-03-16 DIAGNOSIS — E782 Mixed hyperlipidemia: Secondary | ICD-10-CM | POA: Diagnosis not present

## 2019-03-16 DIAGNOSIS — I482 Chronic atrial fibrillation, unspecified: Secondary | ICD-10-CM | POA: Diagnosis not present

## 2019-03-19 ENCOUNTER — Inpatient Hospital Stay (HOSPITAL_COMMUNITY): Payer: PPO | Admitting: Hematology

## 2019-03-19 ENCOUNTER — Other Ambulatory Visit: Payer: Self-pay

## 2019-03-19 ENCOUNTER — Encounter (HOSPITAL_COMMUNITY): Payer: Self-pay | Admitting: Hematology

## 2019-03-19 LAB — BCR-ABL1 FISH
Cells Analyzed: 200
Cells Counted: 200

## 2019-03-19 NOTE — Patient Instructions (Signed)
[facility] at [facility] Discharge Instructions  You were seen today by Dr. Katragadda. He went over your recent lab results. He will see you back in  for labs and follow up.   Thank you for choosing  [facility] at [facility] to provide your oncology and hematology care.  To afford each patient quality time with our provider, please arrive at least 15 minutes before your scheduled appointment time.   If you have a lab appointment with the Cancer Center please come in thru the  Main Entrance and check in at the main information desk  You need to re-schedule your appointment should you arrive 10 or more minutes late.  We strive to give you quality time with our providers, and arriving late affects you and other patients whose appointments are after yours.  Also, if you no show three or more times for appointments you may be dismissed from the clinic at the providers discretion.     Again, thank you for choosing [facility].  Our hope is that these requests will decrease the amount of time that you wait before being seen by our physicians.       _____________________________________________________________  Should you have questions after your visit to [facility], please contact our office at [phone] between the hours of 8:00 a.m. and 4:30 p.m.  Voicemails left after 4:00 p.m. will not be returned until the following business day.  For prescription refill requests, have your pharmacy contact our office and allow 72 hours.    Cancer Center Support Programs:   > Cancer Support Group  2nd Tuesday of the month 1pm-2pm, Journey Room    

## 2019-03-19 NOTE — Progress Notes (Signed)
Error   This encounter was created in error - please disregard. 

## 2019-03-20 LAB — CALR + JAK2 E12-15 + MPL (REFLEXED)

## 2019-03-20 LAB — JAK2 V617F, W REFLEX TO CALR/E12/MPL

## 2019-04-02 ENCOUNTER — Inpatient Hospital Stay (HOSPITAL_BASED_OUTPATIENT_CLINIC_OR_DEPARTMENT_OTHER): Payer: PPO | Admitting: Hematology

## 2019-04-02 ENCOUNTER — Other Ambulatory Visit: Payer: Self-pay

## 2019-04-02 ENCOUNTER — Encounter (HOSPITAL_COMMUNITY): Payer: Self-pay | Admitting: Hematology

## 2019-04-02 DIAGNOSIS — Z79899 Other long term (current) drug therapy: Secondary | ICD-10-CM

## 2019-04-02 DIAGNOSIS — N189 Chronic kidney disease, unspecified: Secondary | ICD-10-CM

## 2019-04-02 DIAGNOSIS — Z7901 Long term (current) use of anticoagulants: Secondary | ICD-10-CM

## 2019-04-02 DIAGNOSIS — I480 Paroxysmal atrial fibrillation: Secondary | ICD-10-CM | POA: Diagnosis not present

## 2019-04-02 DIAGNOSIS — D72829 Elevated white blood cell count, unspecified: Secondary | ICD-10-CM | POA: Diagnosis not present

## 2019-04-02 DIAGNOSIS — I129 Hypertensive chronic kidney disease with stage 1 through stage 4 chronic kidney disease, or unspecified chronic kidney disease: Secondary | ICD-10-CM | POA: Diagnosis not present

## 2019-04-02 DIAGNOSIS — E785 Hyperlipidemia, unspecified: Secondary | ICD-10-CM | POA: Diagnosis not present

## 2019-04-02 DIAGNOSIS — M797 Fibromyalgia: Secondary | ICD-10-CM | POA: Diagnosis not present

## 2019-04-02 NOTE — Assessment & Plan Note (Signed)
1.  Leukocytosis: - Patient referred to our office for evaluation of leukocytosis.  She had leukocytosis since 2018. - CBC on 05/27/2018 shows mildly elevated white count of 10.9 with normal differential.  On 12/03/2018 white count was 13.5 with elevated neutrophils and lymphocytes.  On 12/10/2018 white count is 15.5 with ANC of 10.5 and ALC of 3.4. - Hemoglobin and platelets are within normal limits.  Denies any B symptoms.  Patient is a never smoker.  No history of splenectomy or CT disorders. - We reviewed results of her blood work.  White count was 14.3 with elevated neutrophils and monocytes.  Lymphocytes were normal. -Jak 2 V6 34F with reflex testing was negative.  BCR/ABL by FISH is also negative. -Differential diagnosis includes CMML, Philadelphia chromosome negative CML.  However she does not have any B symptoms or cytopenias at this time. -We talked about the options of completing work-up including a bone marrow biopsy versus watch and wait and do it when symptoms develop.  Upon further discussion, we opted for the latter addition. -We will see her back in 2 months with a repeat CBC and LDH.  If there is any cytopenias or B symptoms we will consider doing further work-up.

## 2019-04-02 NOTE — Progress Notes (Signed)
[facility] [address] [place] [address]   CLINIC:  Medical Oncology/Hematology  PCP:  Celene Squibb, MD [place] [address] [phone]   REASON FOR VISIT:  Follow-up for leukocytosis.  CURRENT THERAPY: Observation.   HISTORY OF PRESENT ILLNESS: She was referred to our clinic for evaluation of leukocytosis since 2018.  She was a never smoker.  No history of splenectomy.  Denies any history of connective tissue disorders.  No family history of malignancies.    INTERVAL HISTORY: Since last visit, she denies any fevers, night sweats or weight loss.  Denies any steroid intake.  Denies any infections or antibiotic use.  Denies any ER visits or hospitalizations.  Energy and appetite levels in 100%.  She does not report any pain.   REVIEW OF SYSTEMS:  Review of Systems  All other systems reviewed and are negative.    PAST MEDICAL/SURGICAL HISTORY:  Past Medical History:  Diagnosis Date  . Chronic kidney disease   . Essential hypertension, benign   . Fibromyalgia   . Hyperlipidemia   . Paroxysmal atrial fibrillation [facility])    Past Surgical History:  Procedure Laterality Date  . CESAREAN SECTION    . COLONOSCOPY N/A 05/28/2014   Procedure: COLONOSCOPY;  Surgeon: Danie Binder, MD;  Location: [facility];  Service: Endoscopy;  Laterality: N/A;  8:30 AM  . HYSTEROSCOPY W/D&C N/A 11/07/2016   Procedure: DILATATION AND CURETTAGE /HYSTEROSCOPY;  Surgeon: Florian Buff, MD;  Location: [facility] ORS;  Service: Gynecology;  Laterality: N/A;     SOCIAL HISTORY:  Social History   Socioeconomic History  . Marital status: Married    Spouse name: Not on file  . Number of children: 2  . Years of education: Not on file  . Highest education level: Not on file  Occupational History  . Not on file  Social Needs  . Financial resource strain: Not on file  . Food insecurity:    Worry: Not on file    Inability: Not on file  . Transportation  needs:    Medical: Not on file    Non-medical: Not on file  Tobacco Use  . Smoking status: Never Smoker  . Smokeless tobacco: Never Used  Substance and Sexual Activity  . Alcohol use: No    Alcohol/week: 0.0 standard drinks  . Drug use: No  . Sexual activity: Not Currently  Lifestyle  . Physical activity:    Days per week: Not on file    Minutes per session: Not on file  . Stress: Not on file  Relationships  . Social connections:    Talks on phone: Not on file    Gets together: Not on file    Attends religious service: Not on file    Active member of club or organization: Not on file    Attends meetings of clubs or organizations: Not on file    Relationship status: Not on file  . Intimate partner violence:    Fear of current or ex partner: Not on file    Emotionally abused: Not on file    Physically abused: Not on file    Forced sexual activity: Not on file  Other Topics Concern  . Not on file  Social History Narrative  . Not on file    FAMILY HISTORY:  Family History  Problem Relation Age of Onset  . Heart attack Father     CURRENT MEDICATIONS:  Outpatient Encounter Medications  as of 04/02/2019  Medication Sig  . atorvastatin (LIPITOR) 10 MG tablet   . Calcium Carbonate-Vitamin D (CALCIUM 500/VITAMIN D PO) Take 600 mg by mouth.  . CARTIA XT 120 MG 24 hr capsule TAKE 1 CAPSULE(120 MG) BY MOUTH DAILY  . ELIQUIS 5 MG TABS tablet TAKE 1 TABLET(5 MG) BY MOUTH TWICE DAILY  . hydrochlorothiazide (MICROZIDE) 12.5 MG capsule   . losartan (COZAAR) 100 MG tablet   . losartan-hydrochlorothiazide (HYZAAR) 100-12.5 MG tablet TAKE 1 TABLET BY MOUTH DAILY  . megestrol (MEGACE) 20 MG tablet Take 1 tablet twice a day   No facility-administered encounter medications on file as of 04/02/2019.     ALLERGIES:  Allergies  Allergen Reactions  . Hydrocodone Itching     PHYSICAL EXAM:  ECOG Performance status: 1  Vitals:   04/02/19 1155  BP: (!) 158/55  Pulse: 64  Resp: 18   Temp: 98.8 F (37.1 C)  SpO2: 100%   Filed Weights   04/02/19 1155  Weight: 197 lb (89.4 kg)    Physical Exam Vitals signs reviewed.  Constitutional:      Appearance: Normal appearance.  Cardiovascular:     Rate and Rhythm: Normal rate and regular rhythm.     Heart sounds: Normal heart sounds.  Pulmonary:     Effort: Pulmonary effort is normal.     Breath sounds: Normal breath sounds.  Abdominal:     General: There is no distension.     Palpations: Abdomen is soft. There is no mass.  Musculoskeletal:        General: No swelling.  Lymphadenopathy:     Cervical: No cervical adenopathy.  Skin:    General: Skin is warm.  Neurological:     General: No focal deficit present.     Mental Status: She is alert and oriented to person, place, and time.  Psychiatric:        Mood and Affect: Mood normal.        Behavior: Behavior normal.      LABORATORY DATA:  I have reviewed the labs as listed.  CBC    Component Value Date/Time   WBC 14.3 (H) 02/19/2019 1457   RBC 4.52 02/19/2019 1457   HGB 12.8 02/19/2019 1457   HCT 41.3 02/19/2019 1457   PLT 294 02/19/2019 1457   MCV 91.4 02/19/2019 1457   MCH 28.3 02/19/2019 1457   MCHC 31.0 02/19/2019 1457   RDW 13.9 02/19/2019 1457   LYMPHSABS 2.9 02/19/2019 1457   MONOABS 1.2 (H) 02/19/2019 1457   EOSABS 0.1 02/19/2019 1457   BASOSABS 0.1 02/19/2019 1457   CMP Latest Ref Rng & Units 02/19/2019 06/10/2017 11/02/2016  Glucose 70 - 99 mg/dL 98 93 84  BUN 8 - 23 mg/dL 21 16 16   Creatinine 0.44 - 1.00 mg/dL 1.06(H) 1.10(H) 0.87  Sodium 135 - 145 mmol/L 139 139 135  Potassium 3.5 - 5.1 mmol/L 4.0 4.1 3.5  Chloride 98 - 111 mmol/L 108 106 102  CO2 22 - 32 mmol/L 24 25 26   Calcium 8.9 - 10.3 mg/dL 10.0 9.7 9.1  Total Protein 6.5 - 8.1 g/dL 8.3(H) - 7.6  Total Bilirubin 0.3 - 1.2 mg/dL 0.8 - 0.9  Alkaline Phos 38 - 126 U/L 126 - 120  AST 15 - 41 U/L 16 - 19  ALT 0 - 44 U/L 22 - 20    PENDING LABS:    ASSESSMENT & PLAN:    Leukocytosis 1.  Leukocytosis: - Patient referred to our  office for evaluation of leukocytosis.  She had leukocytosis since 2018. - CBC on 05/27/2018 shows mildly elevated white count of 10.9 with normal differential.  On 12/03/2018 white count was 13.5 with elevated neutrophils and lymphocytes.  On 12/10/2018 white count is 15.5 with ANC of 10.5 and ALC of 3.4. - Hemoglobin and platelets are within normal limits.  Denies any B symptoms.  Patient is a never smoker.  No history of splenectomy or CT disorders. - We reviewed results of her blood work.  White count was 14.3 with elevated neutrophils and monocytes.  Lymphocytes were normal. -Jak 2 V6 19F with reflex testing was negative.  BCR/ABL by FISH is also negative. -Differential diagnosis includes CMML, Philadelphia chromosome negative CML.  However she does not have any B symptoms or cytopenias at this time. -We talked about the options of completing work-up including a bone marrow biopsy versus watch and wait and do it when symptoms develop.  Upon further discussion, we opted for the latter addition. -We will see her back in 2 months with a repeat CBC and LDH.  If there is any cytopenias or B symptoms we will consider doing further work-up.   Total time spent is 25 minutes with more than 50% of the time spent face-to-face discussing differential diagnosis, further work-up options and coordination of care.  Orders placed this encounter:  No orders of the defined types were placed in this encounter.     Mike Craze, NP [place] [phone]

## 2019-04-06 DIAGNOSIS — I10 Essential (primary) hypertension: Secondary | ICD-10-CM | POA: Diagnosis not present

## 2019-04-06 DIAGNOSIS — I482 Chronic atrial fibrillation, unspecified: Secondary | ICD-10-CM | POA: Diagnosis not present

## 2019-04-06 DIAGNOSIS — I4891 Unspecified atrial fibrillation: Secondary | ICD-10-CM | POA: Diagnosis not present

## 2019-04-06 DIAGNOSIS — R7303 Prediabetes: Secondary | ICD-10-CM | POA: Diagnosis not present

## 2019-04-06 DIAGNOSIS — E782 Mixed hyperlipidemia: Secondary | ICD-10-CM | POA: Diagnosis not present

## 2019-04-06 DIAGNOSIS — R7301 Impaired fasting glucose: Secondary | ICD-10-CM | POA: Diagnosis not present

## 2019-04-08 DIAGNOSIS — R7303 Prediabetes: Secondary | ICD-10-CM | POA: Diagnosis not present

## 2019-04-08 DIAGNOSIS — N189 Chronic kidney disease, unspecified: Secondary | ICD-10-CM | POA: Diagnosis not present

## 2019-04-08 DIAGNOSIS — I10 Essential (primary) hypertension: Secondary | ICD-10-CM | POA: Diagnosis not present

## 2019-04-08 DIAGNOSIS — R7301 Impaired fasting glucose: Secondary | ICD-10-CM | POA: Diagnosis not present

## 2019-04-08 DIAGNOSIS — E782 Mixed hyperlipidemia: Secondary | ICD-10-CM | POA: Diagnosis not present

## 2019-04-14 DIAGNOSIS — I129 Hypertensive chronic kidney disease with stage 1 through stage 4 chronic kidney disease, or unspecified chronic kidney disease: Secondary | ICD-10-CM | POA: Diagnosis not present

## 2019-04-14 DIAGNOSIS — E782 Mixed hyperlipidemia: Secondary | ICD-10-CM | POA: Diagnosis not present

## 2019-04-14 DIAGNOSIS — D72829 Elevated white blood cell count, unspecified: Secondary | ICD-10-CM | POA: Diagnosis not present

## 2019-04-14 DIAGNOSIS — R7303 Prediabetes: Secondary | ICD-10-CM | POA: Diagnosis not present

## 2019-04-14 DIAGNOSIS — E669 Obesity, unspecified: Secondary | ICD-10-CM | POA: Diagnosis not present

## 2019-04-14 DIAGNOSIS — N183 Chronic kidney disease, stage 3 (moderate): Secondary | ICD-10-CM | POA: Diagnosis not present

## 2019-04-14 DIAGNOSIS — I480 Paroxysmal atrial fibrillation: Secondary | ICD-10-CM | POA: Diagnosis not present

## 2019-05-12 DIAGNOSIS — E782 Mixed hyperlipidemia: Secondary | ICD-10-CM | POA: Diagnosis not present

## 2019-05-12 DIAGNOSIS — I4891 Unspecified atrial fibrillation: Secondary | ICD-10-CM | POA: Diagnosis not present

## 2019-05-12 DIAGNOSIS — I10 Essential (primary) hypertension: Secondary | ICD-10-CM | POA: Diagnosis not present

## 2019-05-12 DIAGNOSIS — R7301 Impaired fasting glucose: Secondary | ICD-10-CM | POA: Diagnosis not present

## 2019-05-12 DIAGNOSIS — R7303 Prediabetes: Secondary | ICD-10-CM | POA: Diagnosis not present

## 2019-05-12 DIAGNOSIS — I482 Chronic atrial fibrillation, unspecified: Secondary | ICD-10-CM | POA: Diagnosis not present

## 2019-05-22 ENCOUNTER — Other Ambulatory Visit (HOSPITAL_COMMUNITY): Payer: Self-pay | Admitting: *Deleted

## 2019-05-22 DIAGNOSIS — D72829 Elevated white blood cell count, unspecified: Secondary | ICD-10-CM

## 2019-05-25 ENCOUNTER — Other Ambulatory Visit: Payer: Self-pay

## 2019-05-25 ENCOUNTER — Inpatient Hospital Stay (HOSPITAL_COMMUNITY): Payer: PPO | Attending: Hematology

## 2019-05-25 DIAGNOSIS — D72829 Elevated white blood cell count, unspecified: Secondary | ICD-10-CM | POA: Insufficient documentation

## 2019-05-25 DIAGNOSIS — I480 Paroxysmal atrial fibrillation: Secondary | ICD-10-CM | POA: Insufficient documentation

## 2019-05-25 DIAGNOSIS — Z7901 Long term (current) use of anticoagulants: Secondary | ICD-10-CM | POA: Insufficient documentation

## 2019-05-25 DIAGNOSIS — Z79899 Other long term (current) drug therapy: Secondary | ICD-10-CM | POA: Diagnosis not present

## 2019-05-25 DIAGNOSIS — E785 Hyperlipidemia, unspecified: Secondary | ICD-10-CM | POA: Insufficient documentation

## 2019-05-25 DIAGNOSIS — N189 Chronic kidney disease, unspecified: Secondary | ICD-10-CM | POA: Insufficient documentation

## 2019-05-25 DIAGNOSIS — I10 Essential (primary) hypertension: Secondary | ICD-10-CM | POA: Diagnosis not present

## 2019-05-25 LAB — CBC WITH DIFFERENTIAL/PLATELET
Abs Immature Granulocytes: 0.08 10*3/uL — ABNORMAL HIGH (ref 0.00–0.07)
Basophils Absolute: 0.1 10*3/uL (ref 0.0–0.1)
Basophils Relative: 0 %
Eosinophils Absolute: 0.1 10*3/uL (ref 0.0–0.5)
Eosinophils Relative: 1 %
HCT: 41.7 % (ref 36.0–46.0)
Hemoglobin: 13 g/dL (ref 12.0–15.0)
Immature Granulocytes: 1 %
Lymphocytes Relative: 23 %
Lymphs Abs: 2.8 10*3/uL (ref 0.7–4.0)
MCH: 28.6 pg (ref 26.0–34.0)
MCHC: 31.2 g/dL (ref 30.0–36.0)
MCV: 91.6 fL (ref 80.0–100.0)
Monocytes Absolute: 1 10*3/uL (ref 0.1–1.0)
Monocytes Relative: 8 %
Neutro Abs: 8.2 10*3/uL — ABNORMAL HIGH (ref 1.7–7.7)
Neutrophils Relative %: 67 %
Platelets: 236 10*3/uL (ref 150–400)
RBC: 4.55 MIL/uL (ref 3.87–5.11)
RDW: 14 % (ref 11.5–15.5)
WBC: 12.2 10*3/uL — ABNORMAL HIGH (ref 4.0–10.5)
nRBC: 0 % (ref 0.0–0.2)

## 2019-05-25 LAB — COMPREHENSIVE METABOLIC PANEL
ALT: 19 U/L (ref 0–44)
AST: 16 U/L (ref 15–41)
Albumin: 4.2 g/dL (ref 3.5–5.0)
Alkaline Phosphatase: 127 U/L — ABNORMAL HIGH (ref 38–126)
Anion gap: 8 (ref 5–15)
BUN: 20 mg/dL (ref 8–23)
CO2: 22 mmol/L (ref 22–32)
Calcium: 9.8 mg/dL (ref 8.9–10.3)
Chloride: 111 mmol/L (ref 98–111)
Creatinine, Ser: 1.21 mg/dL — ABNORMAL HIGH (ref 0.44–1.00)
GFR calc Af Amer: 51 mL/min — ABNORMAL LOW (ref >60)
GFR calc non Af Amer: 44 mL/min — ABNORMAL LOW (ref >60)
Glucose, Bld: 113 mg/dL — ABNORMAL HIGH (ref 70–99)
Potassium: 3.4 mmol/L — ABNORMAL LOW (ref 3.5–5.1)
Sodium: 141 mmol/L (ref 135–145)
Total Bilirubin: 0.7 mg/dL (ref 0.3–1.2)
Total Protein: 7.7 g/dL (ref 6.5–8.1)

## 2019-05-25 LAB — LACTATE DEHYDROGENASE: LDH: 111 U/L (ref 98–192)

## 2019-06-01 ENCOUNTER — Encounter (HOSPITAL_COMMUNITY): Payer: Self-pay | Admitting: Hematology

## 2019-06-01 ENCOUNTER — Inpatient Hospital Stay (HOSPITAL_BASED_OUTPATIENT_CLINIC_OR_DEPARTMENT_OTHER): Payer: PPO | Admitting: Hematology

## 2019-06-01 ENCOUNTER — Other Ambulatory Visit: Payer: Self-pay

## 2019-06-01 VITALS — BP 131/46 | HR 66 | Temp 97.5°F | Resp 18 | Wt 193.0 lb

## 2019-06-01 DIAGNOSIS — I480 Paroxysmal atrial fibrillation: Secondary | ICD-10-CM

## 2019-06-01 DIAGNOSIS — I10 Essential (primary) hypertension: Secondary | ICD-10-CM | POA: Diagnosis not present

## 2019-06-01 DIAGNOSIS — N189 Chronic kidney disease, unspecified: Secondary | ICD-10-CM

## 2019-06-01 DIAGNOSIS — D72829 Elevated white blood cell count, unspecified: Secondary | ICD-10-CM | POA: Diagnosis not present

## 2019-06-01 DIAGNOSIS — Z7901 Long term (current) use of anticoagulants: Secondary | ICD-10-CM

## 2019-06-01 DIAGNOSIS — E785 Hyperlipidemia, unspecified: Secondary | ICD-10-CM | POA: Diagnosis not present

## 2019-06-01 DIAGNOSIS — Z79899 Other long term (current) drug therapy: Secondary | ICD-10-CM | POA: Diagnosis not present

## 2019-06-01 NOTE — Progress Notes (Signed)
[place] [address] [place] [address]   CLINIC:  Medical Oncology/Hematology  PCP:  Celene Squibb, MD [place] [address] [phone]   REASON FOR VISIT:  Follow-up for leukocytosis.  CURRENT THERAPY: Observation.   HISTORY OF PRESENT ILLNESS: Seen for follow-up of leukocytosis.  She has history of leukocytosis since 2018.  She was never smoker.  No history of splenectomy.  Does not report any prior history of connective tissue disorders.   INTERVAL HISTORY: Since her last visit, she did not experience any fevers, night sweats or weight loss.  No recurrent infections.  No steroid intake.  Energy and appetite levels are 75%.  No joint pains reported.  No ER visits or hospitalizations.   REVIEW OF SYSTEMS:  Review of Systems  All other systems reviewed and are negative.    PAST MEDICAL/SURGICAL HISTORY:  Past Medical History:  Diagnosis Date  . Chronic kidney disease   . Essential hypertension, benign   . Fibromyalgia   . Hyperlipidemia   . Paroxysmal atrial fibrillation [facility])    Past Surgical History:  Procedure Laterality Date  . CESAREAN SECTION    . COLONOSCOPY N/A 05/28/2014   Procedure: COLONOSCOPY;  Surgeon: Danie Binder, MD;  Location[facility];  Service: Endoscopy;  Laterality: N/A;  8:30 AM  . HYSTEROSCOPY W/D&C N/A 11/07/2016   Procedure: DILATATION AND CURETTAGE /HYSTEROSCOPY;  Surgeon: Florian Buff, MD;  Location: [facility] ORS;  Service: Gynecology;  Laterality: N/A;     SOCIAL HISTORY:  Social History   Socioeconomic History  . Marital status: Married    Spouse name: Not on file  . Number of children: 2  . Years of education: Not on file  . Highest education level: Not on file  Occupational History  . Not on file  Social Needs  . Financial resource strain: Not on file  . Food insecurity    Worry: Not on file    Inability: Not on file  . Transportation needs    Medical: Not on file   Non-medical: Not on file  Tobacco Use  . Smoking status: Never Smoker  . Smokeless tobacco: Never Used  Substance and Sexual Activity  . Alcohol use: No    Alcohol/week: 0.0 standard drinks  . Drug use: No  . Sexual activity: Not Currently  Lifestyle  . Physical activity    Days per week: Not on file    Minutes per session: Not on file  . Stress: Not on file  Relationships  . Social Herbalist on phone: Not on file    Gets together: Not on file    Attends religious service: Not on file    Active member of club or organization: Not on file    Attends meetings of clubs or organizations: Not on file    Relationship status: Not on file  . Intimate partner violence    Fear of current or ex partner: Not on file    Emotionally abused: Not on file    Physically abused: Not on file    Forced sexual activity: Not on file  Other Topics Concern  . Not on file  Social History Narrative  . Not on file    FAMILY HISTORY:  Family History  Problem Relation Age of Onset  . Heart attack Father     CURRENT MEDICATIONS:  Outpatient Encounter Medications as of 06/01/2019  Medication Sig  . atorvastatin (LIPITOR)  10 MG tablet   . Calcium Carbonate-Vitamin D (CALCIUM 500/VITAMIN D PO) Take 600 mg by mouth.  . CARTIA XT 120 MG 24 hr capsule TAKE 1 CAPSULE(120 MG) BY MOUTH DAILY  . ELIQUIS 5 MG TABS tablet TAKE 1 TABLET(5 MG) BY MOUTH TWICE DAILY  . hydrochlorothiazide (MICROZIDE) 12.5 MG capsule   . losartan (COZAAR) 100 MG tablet   . losartan-hydrochlorothiazide (HYZAAR) 100-12.5 MG tablet TAKE 1 TABLET BY MOUTH DAILY  . megestrol (MEGACE) 20 MG tablet Take 1 tablet twice a day   No facility-administered encounter medications on file as of 06/01/2019.     ALLERGIES:  Allergies  Allergen Reactions  . Hydrocodone Itching     PHYSICAL EXAM:  ECOG Performance status: 1  Vitals:   06/01/19 1148  BP: (!) 131/46  Pulse: 66  Resp: 18  Temp: (!) 97.5 F (36.4 C)  SpO2:  99%   Filed Weights   06/01/19 1148  Weight: 193 lb (87.5 kg)    Physical Exam Vitals signs reviewed.  Constitutional:      Appearance: Normal appearance.  Cardiovascular:     Rate and Rhythm: Normal rate and regular rhythm.     Heart sounds: Normal heart sounds.  Pulmonary:     Effort: Pulmonary effort is normal.     Breath sounds: Normal breath sounds.  Abdominal:     General: There is no distension.     Palpations: Abdomen is soft. There is no mass.  Musculoskeletal:        General: No swelling.  Lymphadenopathy:     Cervical: No cervical adenopathy.  Skin:    General: Skin is warm.  Neurological:     General: No focal deficit present.     Mental Status: She is alert and oriented to person, place, and time.  Psychiatric:        Mood and Affect: Mood normal.        Behavior: Behavior normal.      LABORATORY DATA:  I have reviewed the labs as listed.  CBC    Component Value Date/Time   WBC 12.2 (H) 05/25/2019 1242   RBC 4.55 05/25/2019 1242   HGB 13.0 05/25/2019 1242   HCT 41.7 05/25/2019 1242   PLT 236 05/25/2019 1242   MCV 91.6 05/25/2019 1242   MCH 28.6 05/25/2019 1242   MCHC 31.2 05/25/2019 1242   RDW 14.0 05/25/2019 1242   LYMPHSABS 2.8 05/25/2019 1242   MONOABS 1.0 05/25/2019 1242   EOSABS 0.1 05/25/2019 1242   BASOSABS 0.1 05/25/2019 1242   CMP Latest Ref Rng & Units 05/25/2019 02/19/2019 06/10/2017  Glucose 70 - 99 mg/dL 113(H) 98 93  BUN 8 - 23 mg/dL 20 21 16   Creatinine 0.44 - 1.00 mg/dL 1.21(H) 1.06(H) 1.10(H)  Sodium 135 - 145 mmol/L 141 139 139  Potassium 3.5 - 5.1 mmol/L 3.4(L) 4.0 4.1  Chloride 98 - 111 mmol/L 111 108 106  CO2 22 - 32 mmol/L 22 24 25   Calcium 8.9 - 10.3 mg/dL 9.8 10.0 9.7  Total Protein 6.5 - 8.1 g/dL 7.7 8.3(H) -  Total Bilirubin 0.3 - 1.2 mg/dL 0.7 0.8 -  Alkaline Phos 38 - 126 U/L 127(H) 126 -  AST 15 - 41 U/L 16 16 -  ALT 0 - 44 U/L 19 22 -    PENDING LABS:    ASSESSMENT & PLAN:   Leukocytosis 1.   Leukocytosis: - History of leukocytosis since 2018.  Her hemoglobin and platelets are within normal limits. -  She denies any fevers, night sweats or weight loss.  She is a never smoker.  No history of splenectomy or connective tissue disorders. - Jak 2 V617F with reflex testing was negative.  BCR/ABL by FISH was also negative. - Physical examination today did not reveal any palpable adenopathy or splenomegaly. -We discussed different options including bone marrow biopsy versus wait and watch approach.  Differential includes CMML and Philadelphia chromosome negative CML. -We discussed blood work from this visit.  White count is 12.2.  Absolute neutrophil count is 8200. - We will see her back in 4 months with repeat CBC and LDH.  She was told to come back sooner to our clinic if she develops any B symptoms. - We will consider doing a bone marrow biopsy if there is any worsening of her blood counts.   Total time spent is 25 minutes with more than 50% of the time spent face-to-face discussing differential diagnosis, further work-up options and coordination of care.  Orders placed this encounter:  Orders Placed This Encounter  Procedures  . CBC with Differential/Platelet  . Comprehensive metabolic panel  . Lactate dehydrogenase      Mike Craze, NP [place] [phone]

## 2019-06-01 NOTE — Assessment & Plan Note (Signed)
1.  Leukocytosis: - History of leukocytosis since 2018.  Her hemoglobin and platelets are within normal limits. - She denies any fevers, night sweats or weight loss.  She is a never smoker.  No history of splenectomy or connective tissue disorders. - Jak 2 V617F with reflex testing was negative.  BCR/ABL by FISH was also negative. - Physical examination today did not reveal any palpable adenopathy or splenomegaly. -We discussed different options including bone marrow biopsy versus wait and watch approach.  Differential includes CMML and Philadelphia chromosome negative CML. -We discussed blood work from this visit.  White count is 12.2.  Absolute neutrophil count is 8200. - We will see her back in 4 months with repeat CBC and LDH.  She was told to come back sooner to our clinic if she develops any B symptoms. - We will consider doing a bone marrow biopsy if there is any worsening of her blood counts.

## 2019-06-01 NOTE — Patient Instructions (Signed)
[facility] at [facility] Discharge Instructions  You were seen today by Dr. Katragadda. He went over your recent lab results. He will see you back in 4 months for labs and follow up.   Thank you for choosing [facility] at [facility] to provide your oncology and hematology care.  To afford each patient quality time with our provider, please arrive at least 15 minutes before your scheduled appointment time.   If you have a lab appointment with the Cancer Center please come in thru the  Main Entrance and check in at the main information desk  You need to re-schedule your appointment should you arrive 10 or more minutes late.  We strive to give you quality time with our providers, and arriving late affects you and other patients whose appointments are after yours.  Also, if you no show three or more times for appointments you may be dismissed from the clinic at the providers discretion.     Again, thank you for choosing [facility].  Our hope is that these requests will decrease the amount of time that you wait before being seen by our physicians.       _____________________________________________________________  Should you have questions after your visit to [facility], please contact our office at [phone] between the hours of 8:00 a.m. and 4:30 p.m.  Voicemails left after 4:00 p.m. will not be returned until the following business day.  For prescription refill requests, have your pharmacy contact our office and allow 72 hours.    Cancer Center Support Programs:   > Cancer Support Group  2nd Tuesday of the month 1pm-2pm, Journey Room    

## 2019-06-12 DIAGNOSIS — I4891 Unspecified atrial fibrillation: Secondary | ICD-10-CM | POA: Diagnosis not present

## 2019-06-12 DIAGNOSIS — E782 Mixed hyperlipidemia: Secondary | ICD-10-CM | POA: Diagnosis not present

## 2019-06-12 DIAGNOSIS — I482 Chronic atrial fibrillation, unspecified: Secondary | ICD-10-CM | POA: Diagnosis not present

## 2019-06-12 DIAGNOSIS — R7303 Prediabetes: Secondary | ICD-10-CM | POA: Diagnosis not present

## 2019-06-12 DIAGNOSIS — I10 Essential (primary) hypertension: Secondary | ICD-10-CM | POA: Diagnosis not present

## 2019-06-12 DIAGNOSIS — R7301 Impaired fasting glucose: Secondary | ICD-10-CM | POA: Diagnosis not present

## 2019-07-15 DIAGNOSIS — R7303 Prediabetes: Secondary | ICD-10-CM | POA: Diagnosis not present

## 2019-07-15 DIAGNOSIS — I482 Chronic atrial fibrillation, unspecified: Secondary | ICD-10-CM | POA: Diagnosis not present

## 2019-07-15 DIAGNOSIS — R7301 Impaired fasting glucose: Secondary | ICD-10-CM | POA: Diagnosis not present

## 2019-07-15 DIAGNOSIS — I4891 Unspecified atrial fibrillation: Secondary | ICD-10-CM | POA: Diagnosis not present

## 2019-07-15 DIAGNOSIS — E782 Mixed hyperlipidemia: Secondary | ICD-10-CM | POA: Diagnosis not present

## 2019-07-15 DIAGNOSIS — I10 Essential (primary) hypertension: Secondary | ICD-10-CM | POA: Diagnosis not present

## 2019-08-14 DIAGNOSIS — R7301 Impaired fasting glucose: Secondary | ICD-10-CM | POA: Diagnosis not present

## 2019-08-14 DIAGNOSIS — R7303 Prediabetes: Secondary | ICD-10-CM | POA: Diagnosis not present

## 2019-08-14 DIAGNOSIS — I482 Chronic atrial fibrillation, unspecified: Secondary | ICD-10-CM | POA: Diagnosis not present

## 2019-08-14 DIAGNOSIS — E782 Mixed hyperlipidemia: Secondary | ICD-10-CM | POA: Diagnosis not present

## 2019-08-14 DIAGNOSIS — I4891 Unspecified atrial fibrillation: Secondary | ICD-10-CM | POA: Diagnosis not present

## 2019-08-14 DIAGNOSIS — I10 Essential (primary) hypertension: Secondary | ICD-10-CM | POA: Diagnosis not present

## 2019-08-26 NOTE — Progress Notes (Signed)
This encounter was created in error - please disregard.

## 2019-09-11 DIAGNOSIS — R7301 Impaired fasting glucose: Secondary | ICD-10-CM | POA: Diagnosis not present

## 2019-09-11 DIAGNOSIS — E782 Mixed hyperlipidemia: Secondary | ICD-10-CM | POA: Diagnosis not present

## 2019-09-11 DIAGNOSIS — I4891 Unspecified atrial fibrillation: Secondary | ICD-10-CM | POA: Diagnosis not present

## 2019-09-11 DIAGNOSIS — I482 Chronic atrial fibrillation, unspecified: Secondary | ICD-10-CM | POA: Diagnosis not present

## 2019-09-11 DIAGNOSIS — R7303 Prediabetes: Secondary | ICD-10-CM | POA: Diagnosis not present

## 2019-09-11 DIAGNOSIS — I10 Essential (primary) hypertension: Secondary | ICD-10-CM | POA: Diagnosis not present

## 2019-09-29 ENCOUNTER — Other Ambulatory Visit: Payer: Self-pay

## 2019-09-29 ENCOUNTER — Inpatient Hospital Stay (HOSPITAL_COMMUNITY): Payer: PPO | Attending: Hematology

## 2019-09-29 DIAGNOSIS — Z7901 Long term (current) use of anticoagulants: Secondary | ICD-10-CM | POA: Insufficient documentation

## 2019-09-29 DIAGNOSIS — I480 Paroxysmal atrial fibrillation: Secondary | ICD-10-CM | POA: Insufficient documentation

## 2019-09-29 DIAGNOSIS — Z79899 Other long term (current) drug therapy: Secondary | ICD-10-CM | POA: Diagnosis not present

## 2019-09-29 DIAGNOSIS — I10 Essential (primary) hypertension: Secondary | ICD-10-CM | POA: Diagnosis not present

## 2019-09-29 DIAGNOSIS — E785 Hyperlipidemia, unspecified: Secondary | ICD-10-CM | POA: Diagnosis not present

## 2019-09-29 DIAGNOSIS — D72829 Elevated white blood cell count, unspecified: Secondary | ICD-10-CM | POA: Diagnosis not present

## 2019-09-29 DIAGNOSIS — N189 Chronic kidney disease, unspecified: Secondary | ICD-10-CM | POA: Diagnosis not present

## 2019-09-29 LAB — CBC WITH DIFFERENTIAL/PLATELET
Abs Immature Granulocytes: 0.11 10*3/uL — ABNORMAL HIGH (ref 0.00–0.07)
Basophils Absolute: 0.1 10*3/uL (ref 0.0–0.1)
Basophils Relative: 1 %
Eosinophils Absolute: 0.1 10*3/uL (ref 0.0–0.5)
Eosinophils Relative: 1 %
HCT: 43.8 % (ref 36.0–46.0)
Hemoglobin: 13.2 g/dL (ref 12.0–15.0)
Immature Granulocytes: 1 %
Lymphocytes Relative: 24 %
Lymphs Abs: 3.1 10*3/uL (ref 0.7–4.0)
MCH: 28.6 pg (ref 26.0–34.0)
MCHC: 30.1 g/dL (ref 30.0–36.0)
MCV: 95 fL (ref 80.0–100.0)
Monocytes Absolute: 0.9 10*3/uL (ref 0.1–1.0)
Monocytes Relative: 7 %
Neutro Abs: 8.9 10*3/uL — ABNORMAL HIGH (ref 1.7–7.7)
Neutrophils Relative %: 66 %
Platelets: 270 10*3/uL (ref 150–400)
RBC: 4.61 MIL/uL (ref 3.87–5.11)
RDW: 13.7 % (ref 11.5–15.5)
WBC: 13.2 10*3/uL — ABNORMAL HIGH (ref 4.0–10.5)
nRBC: 0.5 % — ABNORMAL HIGH (ref 0.0–0.2)

## 2019-09-29 LAB — COMPREHENSIVE METABOLIC PANEL
ALT: 19 U/L (ref 0–44)
AST: 17 U/L (ref 15–41)
Albumin: 4.2 g/dL (ref 3.5–5.0)
Alkaline Phosphatase: 121 U/L (ref 38–126)
Anion gap: 11 (ref 5–15)
BUN: 23 mg/dL (ref 8–23)
CO2: 22 mmol/L (ref 22–32)
Calcium: 9.9 mg/dL (ref 8.9–10.3)
Chloride: 108 mmol/L (ref 98–111)
Creatinine, Ser: 1.33 mg/dL — ABNORMAL HIGH (ref 0.44–1.00)
GFR calc Af Amer: 46 mL/min — ABNORMAL LOW (ref >60)
GFR calc non Af Amer: 39 mL/min — ABNORMAL LOW (ref >60)
Glucose, Bld: 117 mg/dL — ABNORMAL HIGH (ref 70–99)
Potassium: 3.8 mmol/L (ref 3.5–5.1)
Sodium: 141 mmol/L (ref 135–145)
Total Bilirubin: 0.5 mg/dL (ref 0.3–1.2)
Total Protein: 8.2 g/dL — ABNORMAL HIGH (ref 6.5–8.1)

## 2019-09-29 LAB — LACTATE DEHYDROGENASE: LDH: 138 U/L (ref 98–192)

## 2019-10-06 ENCOUNTER — Other Ambulatory Visit: Payer: Self-pay

## 2019-10-07 ENCOUNTER — Encounter (HOSPITAL_COMMUNITY): Payer: Self-pay | Admitting: Hematology

## 2019-10-07 ENCOUNTER — Inpatient Hospital Stay (HOSPITAL_COMMUNITY): Payer: PPO | Attending: Hematology | Admitting: Hematology

## 2019-10-07 DIAGNOSIS — I10 Essential (primary) hypertension: Secondary | ICD-10-CM | POA: Diagnosis not present

## 2019-10-07 DIAGNOSIS — I480 Paroxysmal atrial fibrillation: Secondary | ICD-10-CM | POA: Diagnosis not present

## 2019-10-07 DIAGNOSIS — Z7901 Long term (current) use of anticoagulants: Secondary | ICD-10-CM | POA: Diagnosis not present

## 2019-10-07 DIAGNOSIS — D72829 Elevated white blood cell count, unspecified: Secondary | ICD-10-CM | POA: Insufficient documentation

## 2019-10-07 DIAGNOSIS — Z79899 Other long term (current) drug therapy: Secondary | ICD-10-CM | POA: Diagnosis not present

## 2019-10-07 DIAGNOSIS — N189 Chronic kidney disease, unspecified: Secondary | ICD-10-CM | POA: Diagnosis not present

## 2019-10-07 DIAGNOSIS — E785 Hyperlipidemia, unspecified: Secondary | ICD-10-CM | POA: Insufficient documentation

## 2019-10-07 DIAGNOSIS — Z8249 Family history of ischemic heart disease and other diseases of the circulatory system: Secondary | ICD-10-CM | POA: Diagnosis not present

## 2019-10-07 NOTE — Assessment & Plan Note (Signed)
1.  Leukocytosis: - History of leukocytosis since 2018.  Her hemoglobin and platelets are within normal limits. - She denies any fevers, night sweats or weight loss.  She is a never smoker.  No history of splenectomy or connective tissue disorders. - Jak 2 V617F with reflex testing was negative.  BCR/ABL by FISH was also negative. - Physical examination today did not reveal any palpable adenopathy or splenomegaly. -We discussed different options including bone marrow biopsy versus wait and watch approach.  Differential includes CMML and Philadelphia chromosome negative CML. -Labs remain essentially unchanged.  White blood cell count 13.2, neutrophil count 8.9.  LDH within normal. - We will consider doing a bone marrow biopsy if there is any worsening of her blood counts. -She may return to clinic in 6 months or sooner if needed.

## 2019-10-07 NOTE — Progress Notes (Signed)
[facility] [address], [place] [address]   CLINIC:  Medical Oncology/Hematology  PCP:  Celene Squibb, MD [place] [address] [phone]   REASON FOR VISIT:  Follow-up for leukocytosis  CURRENT THERAPY: Clinical surveillance   INTERVAL HISTORY:  Ms. Ebanks 73 y.o. female patient presents today for follow-up.  She reports overall doing well.  She denies any significant fatigue.  She denies any medical concerns.  She denies any changes in her bowel habits.  No change in appetite.  No weight loss.  Denies any fevers, chills, night sweats.  She is here for repeat labs and office visit.   REVIEW OF SYSTEMS:  Review of Systems  All other systems reviewed and are negative.    PAST MEDICAL/SURGICAL HISTORY:  Past Medical History:  Diagnosis Date  . Chronic kidney disease   . Essential hypertension, benign   . Fibromyalgia   . Hyperlipidemia   . Paroxysmal atrial fibrillation [facility])    Past Surgical History:  Procedure Laterality Date  . CESAREAN SECTION    . COLONOSCOPY N/A 05/28/2014   Procedure: COLONOSCOPY;  Surgeon: Danie Binder, MD;  Location: [facility] SUITE;  Service: Endoscopy;  Laterality: N/A;  8:30 AM  . HYSTEROSCOPY W/D&C N/A 11/07/2016   Procedure: DILATATION AND CURETTAGE /HYSTEROSCOPY;  Surgeon: Florian Buff, MD;  Location: [facility] ORS;  Service: Gynecology;  Laterality: N/A;     SOCIAL HISTORY:  Social History   Socioeconomic History  . Marital status: Married    Spouse name: Not on file  . Number of children: 2  . Years of education: Not on file  . Highest education level: Not on file  Occupational History  . Not on file  Social Needs  . Financial resource strain: Not on file  . Food insecurity    Worry: Not on file    Inability: Not on file  . Transportation needs    Medical: Not on file    Non-medical: Not on file  Tobacco Use  . Smoking status: Never Smoker  . Smokeless tobacco: Never Used   Substance and Sexual Activity  . Alcohol use: No    Alcohol/week: 0.0 standard drinks  . Drug use: No  . Sexual activity: Not Currently  Lifestyle  . Physical activity    Days per week: Not on file    Minutes per session: Not on file  . Stress: Not on file  Relationships  . Social Herbalist on phone: Not on file    Gets together: Not on file    Attends religious service: Not on file    Active member of club or organization: Not on file    Attends meetings of clubs or organizations: Not on file    Relationship status: Not on file  . Intimate partner violence    Fear of current or ex partner: Not on file    Emotionally abused: Not on file    Physically abused: Not on file    Forced sexual activity: Not on file  Other Topics Concern  . Not on file  Social History Narrative  . Not on file    FAMILY HISTORY:  Family History  Problem Relation Age of Onset  . Heart attack Father     CURRENT MEDICATIONS:  Outpatient Encounter Medications as of 10/07/2019  Medication Sig  . atorvastatin (LIPITOR) 10 MG tablet   . Calcium Carbonate-Vitamin D (CALCIUM 500/VITAMIN D PO) Take  600 mg by mouth.  . CARTIA XT 120 MG 24 hr capsule TAKE 1 CAPSULE(120 MG) BY MOUTH DAILY  . cetirizine (ZYRTEC) 10 MG tablet Take 10 mg by mouth daily.  Marland Kitchen ELIQUIS 5 MG TABS tablet TAKE 1 TABLET(5 MG) BY MOUTH TWICE DAILY  . hydrochlorothiazide (MICROZIDE) 12.5 MG capsule   . losartan (COZAAR) 100 MG tablet   . megestrol (MEGACE) 20 MG tablet Take 1 tablet twice a day  . [DISCONTINUED] losartan-hydrochlorothiazide (HYZAAR) 100-12.5 MG tablet TAKE 1 TABLET BY MOUTH DAILY   No facility-administered encounter medications on file as of 10/07/2019.     ALLERGIES:  Allergies  Allergen Reactions  . Hydrocodone Itching     PHYSICAL EXAM:  ECOG Performance status: 1   Vitals:   10/07/19 1415  BP: (!) 124/47  Pulse: 76  Resp: 18  Temp: (!) 96.6 F (35.9 C)  SpO2: 100%   Filed Weights    10/07/19 1415  Weight: 197 lb (89.4 kg)    Physical Exam Constitutional:      Appearance: Normal appearance. She is obese.  HENT:     Head: Normocephalic.     Right Ear: External ear normal.     Left Ear: External ear normal.  Eyes:     Conjunctiva/sclera: Conjunctivae normal.  Neck:     Musculoskeletal: Normal range of motion.  Cardiovascular:     Rate and Rhythm: Normal rate and regular rhythm.     Pulses: Normal pulses.     Heart sounds: Normal heart sounds.  Pulmonary:     Effort: Pulmonary effort is normal.     Breath sounds: Normal breath sounds.  Abdominal:     General: Bowel sounds are normal.  Musculoskeletal: Normal range of motion.  Skin:    General: Skin is warm.  Neurological:     General: No focal deficit present.     Mental Status: She is alert.  Psychiatric:        Mood and Affect: Mood normal.        Behavior: Behavior normal.      LABORATORY DATA:  I have reviewed the labs as listed.  CBC    Component Value Date/Time   WBC 13.2 (H) 09/29/2019 1105   RBC 4.61 09/29/2019 1105   HGB 13.2 09/29/2019 1105   HCT 43.8 09/29/2019 1105   PLT 270 09/29/2019 1105   MCV 95.0 09/29/2019 1105   MCH 28.6 09/29/2019 1105   MCHC 30.1 09/29/2019 1105   RDW 13.7 09/29/2019 1105   LYMPHSABS 3.1 09/29/2019 1105   MONOABS 0.9 09/29/2019 1105   EOSABS 0.1 09/29/2019 1105   BASOSABS 0.1 09/29/2019 1105   CMP Latest Ref Rng & Units 09/29/2019 05/25/2019 02/19/2019  Glucose 70 - 99 mg/dL 117(H) 113(H) 98  BUN 8 - 23 mg/dL _0 Creatinine 0.44 - 1.00 mg/dL 1.33(H) 1.21(H) 1.06(H)  Sodium 135 - 145 mmol/L 141 141 139  Potassium 3.5 - 5.1 mmol/L 3.8 3.4(L) 4.0  Chloride 98 - 111 mmol/L 108 111 108  CO2 22 - 32 mmol/L _1 Calcium 8.9 - 10.3 mg/dL 9.9 9.8 10.0  Total Protein 6.5 - 8.1 g/dL 8.2(H) 7.7 8.3(H)  Total Bilirubin 0.3 - 1.2 mg/dL 0.5 0.7 0.8  Alkaline Phos 38 - 126 U/L 121 127(H) 126  AST 15 - 41 U/L _2 ALT 0 - 44 U/L _3 ASSESSMENT & PLAN:   Leukocytosis  1.  Leukocytosis: - History of leukocytosis since 2018.  Her hemoglobin and platelets are within normal limits. - She denies any fevers, night sweats or weight loss.  She is a never smoker.  No history of splenectomy or connective tissue disorders. - Jak 2 V617F with reflex testing was negative.  BCR/ABL by FISH was also negative. - Physical examination today did not reveal any palpable adenopathy or splenomegaly. -We discussed different options including bone marrow biopsy versus wait and watch approach.  Differential includes CMML and Philadelphia chromosome negative CML. -Labs remain essentially unchanged.  White blood cell count 13.2, neutrophil count 8.9.  LDH within normal. - We will consider doing a bone marrow biopsy if there is any worsening of her blood counts. -She may return to clinic in 6 months or sooner if needed.         Kim R Nester  [facility] [phone]   

## 2019-10-13 DIAGNOSIS — I10 Essential (primary) hypertension: Secondary | ICD-10-CM | POA: Diagnosis not present

## 2019-10-13 DIAGNOSIS — E782 Mixed hyperlipidemia: Secondary | ICD-10-CM | POA: Diagnosis not present

## 2019-10-13 DIAGNOSIS — N183 Chronic kidney disease, stage 3 unspecified: Secondary | ICD-10-CM | POA: Diagnosis not present

## 2019-10-13 DIAGNOSIS — R7301 Impaired fasting glucose: Secondary | ICD-10-CM | POA: Diagnosis not present

## 2019-10-13 DIAGNOSIS — R7303 Prediabetes: Secondary | ICD-10-CM | POA: Diagnosis not present

## 2019-10-20 DIAGNOSIS — R7303 Prediabetes: Secondary | ICD-10-CM | POA: Diagnosis not present

## 2019-10-20 DIAGNOSIS — Z6832 Body mass index (BMI) 32.0-32.9, adult: Secondary | ICD-10-CM | POA: Diagnosis not present

## 2019-10-20 DIAGNOSIS — I4891 Unspecified atrial fibrillation: Secondary | ICD-10-CM | POA: Diagnosis not present

## 2019-10-20 DIAGNOSIS — E782 Mixed hyperlipidemia: Secondary | ICD-10-CM | POA: Diagnosis not present

## 2019-10-20 DIAGNOSIS — I482 Chronic atrial fibrillation, unspecified: Secondary | ICD-10-CM | POA: Diagnosis not present

## 2019-10-20 DIAGNOSIS — I480 Paroxysmal atrial fibrillation: Secondary | ICD-10-CM | POA: Diagnosis not present

## 2019-10-20 DIAGNOSIS — N1831 Chronic kidney disease, stage 3a: Secondary | ICD-10-CM | POA: Diagnosis not present

## 2019-10-20 DIAGNOSIS — I129 Hypertensive chronic kidney disease with stage 1 through stage 4 chronic kidney disease, or unspecified chronic kidney disease: Secondary | ICD-10-CM | POA: Diagnosis not present

## 2019-10-20 DIAGNOSIS — E669 Obesity, unspecified: Secondary | ICD-10-CM | POA: Diagnosis not present

## 2019-10-20 DIAGNOSIS — I10 Essential (primary) hypertension: Secondary | ICD-10-CM | POA: Diagnosis not present

## 2019-10-20 DIAGNOSIS — D72829 Elevated white blood cell count, unspecified: Secondary | ICD-10-CM | POA: Diagnosis not present

## 2019-11-12 ENCOUNTER — Telehealth: Payer: Self-pay | Admitting: Cardiology

## 2019-11-12 NOTE — Telephone Encounter (Signed)

## 2019-11-13 ENCOUNTER — Encounter: Payer: Self-pay | Admitting: Cardiology

## 2019-11-13 ENCOUNTER — Telehealth (INDEPENDENT_AMBULATORY_CARE_PROVIDER_SITE_OTHER): Payer: PPO | Admitting: Cardiology

## 2019-11-13 DIAGNOSIS — I480 Paroxysmal atrial fibrillation: Secondary | ICD-10-CM

## 2019-11-13 DIAGNOSIS — E782 Mixed hyperlipidemia: Secondary | ICD-10-CM | POA: Diagnosis not present

## 2019-11-13 DIAGNOSIS — I4891 Unspecified atrial fibrillation: Secondary | ICD-10-CM | POA: Diagnosis not present

## 2019-11-13 DIAGNOSIS — I482 Chronic atrial fibrillation, unspecified: Secondary | ICD-10-CM | POA: Diagnosis not present

## 2019-11-13 DIAGNOSIS — I10 Essential (primary) hypertension: Secondary | ICD-10-CM | POA: Diagnosis not present

## 2019-11-13 NOTE — Patient Instructions (Addendum)
Medication Instructions:   Your physician recommends that you continue on your current medications as directed. Please refer to the Current Medication list given to you today.  Labwork:  NONE  Testing/Procedures:  NONE  Follow-Up:  Your physician recommends that you schedule a follow-up appointment in: 6 months ([place] office). You will receive a reminder letter in the mail in about 4 months reminding you to call and schedule your appointment. If you don't receive this letter, please contact our office.  Any Other Special Instructions Will Be Listed Below (If Applicable).  If you need a refill on your cardiac medications before your next appointment, please call your pharmacy.

## 2019-11-13 NOTE — Progress Notes (Signed)
Virtual Visit via Telephone Note   This visit type was conducted due to national recommendations for restrictions regarding the COVID-19 Pandemic (e.g. social distancing) in an effort to limit this patient's exposure and mitigate transmission in our community.  Due to her co-morbid illnesses, this patient is at least at moderate risk for complications without adequate follow up.  This format is felt to be most appropriate for this patient at this time.  The patient did not have access to video technology/had technical difficulties with video requiring transitioning to audio format only (telephone).  All issues noted in this document were discussed and addressed.  No physical exam could be performed with this format.  Please refer to the patient's chart for her  consent to telehealth for [facility].   Date:  11/13/2019   ID:  Josefina, Pleger 1946/02/23, MRN [id number]  Patient Location: Home Provider Location: Home  PCP:  Celene Squibb, MD  Cardiologist:  Rozann Lesches, MD Electrophysiologist:  None   Evaluation Performed:  Follow-Up Visit  Chief Complaint:  Cardiac follow-up  History of Present Illness:    LENABELLE ROSTEN is a 74 y.o. female last seen in September 2019.  We spoke by phone today.  She tells me that she has been doing well overall.  She stated her house mostly during the pandemic but does go out to shop, wears a mask.  She does not describe any sense of palpitations or chest pain.  I reviewed her recent lab work from November as outlined below.  She has had no bleeding problems on Eliquis.  She continues to follow with Dr. [name] in the interim.  The patient does not have symptoms concerning for COVID-19 infection (fever, chills, cough, or new shortness of breath).    Past Medical History:  Diagnosis Date  . Chronic kidney disease   . Essential hypertension   . Fibromyalgia   . Hyperlipidemia   . Paroxysmal atrial fibrillation [facility])    Past Surgical  History:  Procedure Laterality Date  . CESAREAN SECTION    . COLONOSCOPY N/A 05/28/2014   Procedure: COLONOSCOPY;  Surgeon: Danie Binder, MD;  [facility];  Service: Endoscopy;  Laterality: N/A;  8:30 AM  . HYSTEROSCOPY WITH D & C N/A 11/07/2016   Procedure: DILATATION AND CURETTAGE /HYSTEROSCOPY;  Surgeon: Florian Buff, MD;  Location: [facility] ORS;  Service: Gynecology;  Laterality: N/A;     Current Meds  Medication Sig  . atorvastatin (LIPITOR) 10 MG tablet Take 10 mg by mouth daily at 6 PM.   . Calcium Carbonate-Vitamin D (CALCIUM 500/VITAMIN D PO) Take 600 mg by mouth.  . CARTIA XT 120 MG 24 hr capsule TAKE 1 CAPSULE(120 MG) BY MOUTH DAILY  . cetirizine (ZYRTEC) 10 MG tablet Take 10 mg by mouth daily.  Marland Kitchen ELIQUIS 5 MG TABS tablet TAKE 1 TABLET(5 MG) BY MOUTH TWICE DAILY  . hydrochlorothiazide (MICROZIDE) 12.5 MG capsule Take 12.5 mg by mouth daily.   Marland Kitchen losartan (COZAAR) 100 MG tablet Take 100 mg by mouth daily.   . megestrol (MEGACE) 20 MG tablet Take 1 tablet twice a day     Allergies:   Hydrocodone   Social History   Tobacco Use  . Smoking status: Never Smoker  . Smokeless tobacco: Never Used  Substance Use Topics  . Alcohol use: No    Alcohol/week: 0.0 standard drinks  . Drug use: No     Family Hx: The patient's family history  includes Heart attack in her father.  ROS:   Please see the history of present illness. All other systems reviewed and are negative.   Prior CV studies:   The following studies were reviewed today:  Echocardiogram 03/29/2016: Study Conclusions  - Left ventricle: The cavity size was normal. Wall thickness was increased in a pattern of mild LVH. Systolic function was normal. The estimated ejection fraction was in the range of 55% to 60%. Wall motion was normal; there were no regional wall motion abnormalities. Left ventricular diastolic function parameters were normal for the patient&'s age. - Aortic valve: Mildly  calcified annulus. Trileaflet. - Mitral valve: There was mild regurgitation. - Left atrium: The atrium was mildly dilated. - Right atrium: Central venous pressure (est): 3 mm Hg. - Tricuspid valve: There was trivial regurgitation. - Pulmonary arteries: Systolic pressure could not be accurately estimated. - Pericardium, extracardiac: A small pericardial effusion was identified posterior to the heart.  Impressions:  - Mild LVH with LVEF 55-60% and grossly normal diastolic function. Mild left atrial enlargement. Mild mitral regurgitation. Mildly sclerotic aortic annulus. Trivial tricuspid regurgitation. Small posterior pericardial effusion.  Labs/Other Tests and Data Reviewed:    EKG:  An ECG dated 12/30/2017 was personally reviewed today and demonstrated:  Sinus rhythm with nonspecific ST changes.  Recent Labs: 09/29/2019: ALT 19; BUN 23; Creatinine, Ser 1.33; Hemoglobin 13.2; Platelets 270; Potassium 3.8; Sodium 141  February 2020: Hemoglobin 12.5, platelets 371   Wt Readings from Last 3 Encounters:  11/13/19 197 lb (89.4 kg)  10/07/19 197 lb (89.4 kg)  06/01/19 193 lb (87.5 kg)     Objective:    Vital Signs:  Ht 5\' 2"  (1.575 m)   Wt 197 lb (89.4 kg)   BMI 36.03 kg/m   Patient spoke in full sentences, not short of breath. No audible wheezing or coughing. Speech pattern normal.  ASSESSMENT & PLAN:    1.  Paroxysmal atrial fibrillation, CHA2DS2-VASc score of 3.  She is doing well without any significant palpitations and continues on Cardizem CD along with Eliquis.  I reviewed her recent lab work.  No change in current plan.  2.  Essential hypertension, keep follow-up with Dr. [name].  She is also on Cozaar and HCTZ.  COVID-19 Education: The signs and symptoms of COVID-19 were discussed with the patient and how to seek care for testing (follow up with PCP or arrange E-visit).  The importance of social distancing was discussed today.  Time:   Today, I have  spent 5 minutes with the patient with telehealth technology discussing the above problems.     Medication Adjustments/Labs and Tests Ordered: Current medicines are reviewed at length with the patient today.  Concerns regarding medicines are outlined above.   Tests Ordered: No orders of the defined types were placed in this encounter.   Medication Changes: No orders of the defined types were placed in this encounter.   Follow Up:  In Person 6 months in the [place] office.  Signed, Rozann Lesches, MD  11/13/2019 12:47 PM    [place]

## 2019-11-18 DIAGNOSIS — N390 Urinary tract infection, site not specified: Secondary | ICD-10-CM | POA: Diagnosis not present

## 2019-11-27 DIAGNOSIS — E669 Obesity, unspecified: Secondary | ICD-10-CM | POA: Diagnosis not present

## 2019-11-27 DIAGNOSIS — N189 Chronic kidney disease, unspecified: Secondary | ICD-10-CM | POA: Diagnosis not present

## 2019-11-27 DIAGNOSIS — I129 Hypertensive chronic kidney disease with stage 1 through stage 4 chronic kidney disease, or unspecified chronic kidney disease: Secondary | ICD-10-CM | POA: Diagnosis not present

## 2019-11-27 DIAGNOSIS — Z6826 Body mass index (BMI) 26.0-26.9, adult: Secondary | ICD-10-CM | POA: Diagnosis not present

## 2019-11-27 DIAGNOSIS — N390 Urinary tract infection, site not specified: Secondary | ICD-10-CM | POA: Diagnosis not present

## 2019-11-27 DIAGNOSIS — Z6832 Body mass index (BMI) 32.0-32.9, adult: Secondary | ICD-10-CM | POA: Diagnosis not present

## 2019-11-27 DIAGNOSIS — I482 Chronic atrial fibrillation, unspecified: Secondary | ICD-10-CM | POA: Diagnosis not present

## 2019-11-27 DIAGNOSIS — J060 Acute laryngopharyngitis: Secondary | ICD-10-CM | POA: Diagnosis not present

## 2019-11-27 DIAGNOSIS — Z7189 Other specified counseling: Secondary | ICD-10-CM | POA: Diagnosis not present

## 2019-11-27 DIAGNOSIS — N1831 Chronic kidney disease, stage 3a: Secondary | ICD-10-CM | POA: Diagnosis not present

## 2019-11-27 DIAGNOSIS — N183 Chronic kidney disease, stage 3 unspecified: Secondary | ICD-10-CM | POA: Diagnosis not present

## 2019-11-27 DIAGNOSIS — N939 Abnormal uterine and vaginal bleeding, unspecified: Secondary | ICD-10-CM | POA: Diagnosis not present

## 2019-12-18 DIAGNOSIS — I482 Chronic atrial fibrillation, unspecified: Secondary | ICD-10-CM | POA: Diagnosis not present

## 2019-12-18 DIAGNOSIS — I4891 Unspecified atrial fibrillation: Secondary | ICD-10-CM | POA: Diagnosis not present

## 2019-12-18 DIAGNOSIS — E7849 Other hyperlipidemia: Secondary | ICD-10-CM | POA: Diagnosis not present

## 2019-12-18 DIAGNOSIS — I10 Essential (primary) hypertension: Secondary | ICD-10-CM | POA: Diagnosis not present

## 2020-01-12 DIAGNOSIS — E7849 Other hyperlipidemia: Secondary | ICD-10-CM | POA: Diagnosis not present

## 2020-01-12 DIAGNOSIS — I4891 Unspecified atrial fibrillation: Secondary | ICD-10-CM | POA: Diagnosis not present

## 2020-01-12 DIAGNOSIS — I482 Chronic atrial fibrillation, unspecified: Secondary | ICD-10-CM | POA: Diagnosis not present

## 2020-01-12 DIAGNOSIS — I10 Essential (primary) hypertension: Secondary | ICD-10-CM | POA: Diagnosis not present

## 2020-02-17 DIAGNOSIS — I4891 Unspecified atrial fibrillation: Secondary | ICD-10-CM | POA: Diagnosis not present

## 2020-02-17 DIAGNOSIS — I10 Essential (primary) hypertension: Secondary | ICD-10-CM | POA: Diagnosis not present

## 2020-02-17 DIAGNOSIS — I482 Chronic atrial fibrillation, unspecified: Secondary | ICD-10-CM | POA: Diagnosis not present

## 2020-02-17 DIAGNOSIS — E7849 Other hyperlipidemia: Secondary | ICD-10-CM | POA: Diagnosis not present

## 2020-02-18 DIAGNOSIS — E669 Obesity, unspecified: Secondary | ICD-10-CM | POA: Diagnosis not present

## 2020-02-18 DIAGNOSIS — J060 Acute laryngopharyngitis: Secondary | ICD-10-CM | POA: Diagnosis not present

## 2020-02-18 DIAGNOSIS — D72829 Elevated white blood cell count, unspecified: Secondary | ICD-10-CM | POA: Diagnosis not present

## 2020-02-18 DIAGNOSIS — J00 Acute nasopharyngitis [common cold]: Secondary | ICD-10-CM | POA: Diagnosis not present

## 2020-02-18 DIAGNOSIS — I482 Chronic atrial fibrillation, unspecified: Secondary | ICD-10-CM | POA: Diagnosis not present

## 2020-02-18 DIAGNOSIS — I480 Paroxysmal atrial fibrillation: Secondary | ICD-10-CM | POA: Diagnosis not present

## 2020-02-18 DIAGNOSIS — N189 Chronic kidney disease, unspecified: Secondary | ICD-10-CM | POA: Diagnosis not present

## 2020-02-18 DIAGNOSIS — I129 Hypertensive chronic kidney disease with stage 1 through stage 4 chronic kidney disease, or unspecified chronic kidney disease: Secondary | ICD-10-CM | POA: Diagnosis not present

## 2020-02-18 DIAGNOSIS — N1831 Chronic kidney disease, stage 3a: Secondary | ICD-10-CM | POA: Diagnosis not present

## 2020-02-18 DIAGNOSIS — I10 Essential (primary) hypertension: Secondary | ICD-10-CM | POA: Diagnosis not present

## 2020-02-18 DIAGNOSIS — E7849 Other hyperlipidemia: Secondary | ICD-10-CM | POA: Diagnosis not present

## 2020-02-18 DIAGNOSIS — E782 Mixed hyperlipidemia: Secondary | ICD-10-CM | POA: Diagnosis not present

## 2020-02-23 DIAGNOSIS — Z0001 Encounter for general adult medical examination with abnormal findings: Secondary | ICD-10-CM | POA: Diagnosis not present

## 2020-02-23 DIAGNOSIS — I480 Paroxysmal atrial fibrillation: Secondary | ICD-10-CM | POA: Diagnosis not present

## 2020-02-23 DIAGNOSIS — I129 Hypertensive chronic kidney disease with stage 1 through stage 4 chronic kidney disease, or unspecified chronic kidney disease: Secondary | ICD-10-CM | POA: Diagnosis not present

## 2020-02-23 DIAGNOSIS — R7303 Prediabetes: Secondary | ICD-10-CM | POA: Diagnosis not present

## 2020-02-23 DIAGNOSIS — D72829 Elevated white blood cell count, unspecified: Secondary | ICD-10-CM | POA: Diagnosis not present

## 2020-02-23 DIAGNOSIS — I4891 Unspecified atrial fibrillation: Secondary | ICD-10-CM | POA: Diagnosis not present

## 2020-02-23 DIAGNOSIS — I10 Essential (primary) hypertension: Secondary | ICD-10-CM | POA: Diagnosis not present

## 2020-02-23 DIAGNOSIS — I482 Chronic atrial fibrillation, unspecified: Secondary | ICD-10-CM | POA: Diagnosis not present

## 2020-02-23 DIAGNOSIS — E782 Mixed hyperlipidemia: Secondary | ICD-10-CM | POA: Diagnosis not present

## 2020-02-23 DIAGNOSIS — N1831 Chronic kidney disease, stage 3a: Secondary | ICD-10-CM | POA: Diagnosis not present

## 2020-02-23 DIAGNOSIS — Z6831 Body mass index (BMI) 31.0-31.9, adult: Secondary | ICD-10-CM | POA: Diagnosis not present

## 2020-02-23 DIAGNOSIS — E669 Obesity, unspecified: Secondary | ICD-10-CM | POA: Diagnosis not present

## 2020-03-14 DIAGNOSIS — I10 Essential (primary) hypertension: Secondary | ICD-10-CM | POA: Diagnosis not present

## 2020-03-14 DIAGNOSIS — E7849 Other hyperlipidemia: Secondary | ICD-10-CM | POA: Diagnosis not present

## 2020-03-14 DIAGNOSIS — I4891 Unspecified atrial fibrillation: Secondary | ICD-10-CM | POA: Diagnosis not present

## 2020-03-14 DIAGNOSIS — I482 Chronic atrial fibrillation, unspecified: Secondary | ICD-10-CM | POA: Diagnosis not present

## 2020-04-12 ENCOUNTER — Other Ambulatory Visit (HOSPITAL_COMMUNITY): Payer: Self-pay | Admitting: *Deleted

## 2020-04-12 ENCOUNTER — Telehealth: Payer: Self-pay

## 2020-04-12 ENCOUNTER — Inpatient Hospital Stay (HOSPITAL_COMMUNITY): Payer: PPO

## 2020-04-12 DIAGNOSIS — D72829 Elevated white blood cell count, unspecified: Secondary | ICD-10-CM

## 2020-04-12 NOTE — Telephone Encounter (Signed)
  Patient Consent for Virtual Visit         Vanessa Anderson has provided verbal consent on 04/12/2020 for a virtual visit (video or telephone).   CONSENT FOR VIRTUAL VISIT FOR:  [place]  By participating in this virtual visit I agree to the following:  I hereby voluntarily request, consent and authorize [facility] and its employed or contracted physicians, physician assistants, nurse practitioners or other licensed health care professionals (the Practitioner), to provide me with telemedicine health care services (the "Services") as deemed necessary by the treating Practitioner. I acknowledge and consent to receive the Services by the Practitioner via telemedicine. I understand that the telemedicine visit will involve communicating with the Practitioner through live audiovisual communication technology and the disclosure of certain medical information by electronic transmission. I acknowledge that I have been given the opportunity to request an in-person assessment or other available alternative prior to the telemedicine visit and am voluntarily participating in the telemedicine visit.  I understand that I have the right to withhold or withdraw my consent to the use of telemedicine in the course of my care at any time, without affecting my right to future care or treatment, and that the Practitioner or I may terminate the telemedicine visit at any time. I understand that I have the right to inspect all information obtained and/or recorded in the course of the telemedicine visit and may receive copies of available information for a reasonable fee.  I understand that some of the potential risks of receiving the Services via telemedicine include:  Marland Kitchen Delay or interruption in medical evaluation due to technological equipment failure or disruption; . Information transmitted may not be sufficient (e.g. poor resolution of images) to allow for appropriate medical decision making by the Practitioner;  and/or  . In rare instances, security protocols could fail, causing a breach of personal health information.  Furthermore, I acknowledge that it is my responsibility to provide information about my medical history, conditions and care that is complete and accurate to the best of my ability. I acknowledge that Practitioner's advice, recommendations, and/or decision may be based on factors not within their control, such as incomplete or inaccurate data provided by me or distortions of diagnostic images or specimens that may result from electronic transmissions. I understand that the practice of medicine is not an exact science and that Practitioner makes no warranties or guarantees regarding treatment outcomes. I acknowledge that a copy of this consent can be made available to me via my patient portal ([facility]), or I can request a printed copy by calling the office of [facility].    I understand that my insurance will be billed for this visit.   I have read or had this consent read to me. . I understand the contents of this consent, which adequately explains the benefits and risks of the Services being provided via telemedicine.  . I have been provided ample opportunity to ask questions regarding this consent and the Services and have had my questions answered to my satisfaction. . I give my informed consent for the services to be provided through the use of telemedicine in my medical care

## 2020-04-13 ENCOUNTER — Other Ambulatory Visit: Payer: Self-pay

## 2020-04-13 ENCOUNTER — Inpatient Hospital Stay (HOSPITAL_COMMUNITY): Payer: PPO | Attending: Hematology

## 2020-04-13 DIAGNOSIS — D72829 Elevated white blood cell count, unspecified: Secondary | ICD-10-CM | POA: Diagnosis not present

## 2020-04-13 DIAGNOSIS — N189 Chronic kidney disease, unspecified: Secondary | ICD-10-CM | POA: Insufficient documentation

## 2020-04-13 DIAGNOSIS — I10 Essential (primary) hypertension: Secondary | ICD-10-CM | POA: Diagnosis not present

## 2020-04-13 DIAGNOSIS — Z79899 Other long term (current) drug therapy: Secondary | ICD-10-CM | POA: Insufficient documentation

## 2020-04-13 LAB — COMPREHENSIVE METABOLIC PANEL
ALT: 19 U/L (ref 0–44)
AST: 17 U/L (ref 15–41)
Albumin: 4.3 g/dL (ref 3.5–5.0)
Alkaline Phosphatase: 113 U/L (ref 38–126)
Anion gap: 10 (ref 5–15)
BUN: 22 mg/dL (ref 8–23)
CO2: 25 mmol/L (ref 22–32)
Calcium: 9.9 mg/dL (ref 8.9–10.3)
Chloride: 105 mmol/L (ref 98–111)
Creatinine, Ser: 1.21 mg/dL — ABNORMAL HIGH (ref 0.44–1.00)
GFR calc Af Amer: 51 mL/min — ABNORMAL LOW (ref >60)
GFR calc non Af Amer: 44 mL/min — ABNORMAL LOW (ref >60)
Glucose, Bld: 106 mg/dL — ABNORMAL HIGH (ref 70–99)
Potassium: 3.5 mmol/L (ref 3.5–5.1)
Sodium: 140 mmol/L (ref 135–145)
Total Bilirubin: 0.7 mg/dL (ref 0.3–1.2)
Total Protein: 8.1 g/dL (ref 6.5–8.1)

## 2020-04-13 LAB — LACTATE DEHYDROGENASE: LDH: 124 U/L (ref 98–192)

## 2020-04-13 LAB — CBC WITH DIFFERENTIAL/PLATELET
Abs Immature Granulocytes: 0.07 10*3/uL (ref 0.00–0.07)
Basophils Absolute: 0.1 10*3/uL (ref 0.0–0.1)
Basophils Relative: 0 %
Eosinophils Absolute: 0.1 10*3/uL (ref 0.0–0.5)
Eosinophils Relative: 1 %
HCT: 40.7 % (ref 36.0–46.0)
Hemoglobin: 12.8 g/dL (ref 12.0–15.0)
Immature Granulocytes: 1 %
Lymphocytes Relative: 21 %
Lymphs Abs: 2.5 10*3/uL (ref 0.7–4.0)
MCH: 29.1 pg (ref 26.0–34.0)
MCHC: 31.4 g/dL (ref 30.0–36.0)
MCV: 92.5 fL (ref 80.0–100.0)
Monocytes Absolute: 0.9 10*3/uL (ref 0.1–1.0)
Monocytes Relative: 8 %
Neutro Abs: 8.4 10*3/uL — ABNORMAL HIGH (ref 1.7–7.7)
Neutrophils Relative %: 69 %
Platelets: 250 10*3/uL (ref 150–400)
RBC: 4.4 MIL/uL (ref 3.87–5.11)
RDW: 13.9 % (ref 11.5–15.5)
WBC: 12 10*3/uL — ABNORMAL HIGH (ref 4.0–10.5)
nRBC: 0 % (ref 0.0–0.2)

## 2020-04-14 ENCOUNTER — Encounter: Payer: Self-pay | Admitting: Cardiology

## 2020-04-14 ENCOUNTER — Telehealth (INDEPENDENT_AMBULATORY_CARE_PROVIDER_SITE_OTHER): Payer: PPO | Admitting: Cardiology

## 2020-04-14 VITALS — Ht 62.0 in | Wt 197.0 lb

## 2020-04-14 DIAGNOSIS — I480 Paroxysmal atrial fibrillation: Secondary | ICD-10-CM

## 2020-04-14 DIAGNOSIS — I482 Chronic atrial fibrillation, unspecified: Secondary | ICD-10-CM | POA: Diagnosis not present

## 2020-04-14 DIAGNOSIS — I10 Essential (primary) hypertension: Secondary | ICD-10-CM

## 2020-04-14 DIAGNOSIS — I4891 Unspecified atrial fibrillation: Secondary | ICD-10-CM | POA: Diagnosis not present

## 2020-04-14 DIAGNOSIS — E7849 Other hyperlipidemia: Secondary | ICD-10-CM | POA: Diagnosis not present

## 2020-04-14 NOTE — Patient Instructions (Signed)
Medication Instructions:  °Your physician recommends that you continue on your current medications as directed. Please refer to the Current Medication list given to you today. ° °*If you need a refill on your cardiac medications before your next appointment, please call your pharmacy* ° ° °Lab Work: °None today °If you have labs (blood work) drawn today and your tests are completely normal, you will receive your results only by: °• MyChart Message (if you have MyChart) OR °• A paper copy in the mail °If you have any lab test that is abnormal or we need to change your treatment, we will call you to review the results. ° ° °Testing/Procedures: °None today ° ° °Follow-Up: °At [facility], you and your health needs are our priority.  As part of our continuing mission to provide you with exceptional heart care, we have created designated Provider Care Teams.  These Care Teams include your primary Cardiologist (physician) and Advanced Practice Providers (APPs -  Physician Assistants and Nurse Practitioners) who all work together to provide you with the care you need, when you need it. ° °We recommend signing up for the patient portal called "MyChart".  Sign up information is provided on this After Visit Summary.  MyChart is used to connect with patients for Virtual Visits (Telemedicine).  Patients are able to view lab/test results, encounter notes, upcoming appointments, etc.  Non-urgent messages can be sent to your provider as well.   °To learn more about what you can do with MyChart, go to [email or web address]://[email or web address].com.   ° °Your next appointment:   °6 month(s) ° °The format for your next appointment:   °In Person ° °Provider:   °Samuel McDowell, MD ° ° °Other Instructions °None ° ° ° ° °Thank you for choosing [facility] ! ° ° ° ° ° ° ° ° °

## 2020-04-14 NOTE — Progress Notes (Signed)
Virtual Visit via Telephone Note   This visit type was conducted due to national recommendations for restrictions regarding the COVID-19 Pandemic (e.g. social distancing) in an effort to limit this patient's exposure and mitigate transmission in our community.  Due to her co-morbid illnesses, this patient is at least at moderate risk for complications without adequate follow up.  This format is felt to be most appropriate for this patient at this time.  The patient did not have access to video technology/had technical difficulties with video requiring transitioning to audio format only (telephone).  All issues noted in this document were discussed and addressed.  No physical exam could be performed with this format.  Please refer to the patient's chart for her  consent to telehealth for [facility].   The patient was identified using 2 identifiers.  Date:  04/14/2020   ID:  Vanessa Anderson, Vanessa Anderson 1945/12/20, MRN [id number]  Patient Location: Home Provider Location: Home  PCP:  Celene Squibb, MD  Cardiologist:  Rozann Lesches, MD  Electrophysiologist:  None   Evaluation Performed:  Follow-Up Visit  Chief Complaint:   Cardiac follow-up  History of Present Illness:    Vanessa Anderson is a 74 y.o. female last assessed via telehealth encounter in January.  We spoke by phone today.  She does not report any sense of palpitations or unusual shortness of breath.  I reviewed her medications which are stable from a cardiac perspective and outlined below.  She does not report any bleeding problems on Eliquis.  I reviewed her recent lab work as outlined below.   Past Medical History:  Diagnosis Date  . Chronic kidney disease   . Essential hypertension   . Fibromyalgia   . Hyperlipidemia   . Paroxysmal atrial fibrillation [facility])    Past Surgical History:  Procedure Laterality Date  . CESAREAN SECTION    . COLONOSCOPY N/A 05/28/2014   Procedure: COLONOSCOPY;  Surgeon: Danie Binder, MD;   Location: [facility] ENDO SU[facility];  Service: Endoscopy;  Laterality: N/A;  8:30 AM  . HYSTEROSCOPY WITH D & C N/A 11/07/2016   Procedure: DILATATION AND CURETTAGE /HYSTEROSCOPY;  Surgeon: Florian Buff, MD;  Location: [facility] ORS;  Service: Gynecology;  Laterality: N/A;     Current Meds  Medication Sig  . atorvastatin (LIPITOR) 10 MG tablet Take 10 mg by mouth daily at 6 PM.   . Calcium Carbonate-Vitamin D (CALCIUM 500/VITAMIN D PO) Take 600 mg by mouth.  . CARTIA XT 120 MG 24 hr capsule TAKE 1 CAPSULE(120 MG) BY MOUTH DAILY  . cetirizine (ZYRTEC) 10 MG tablet Take 10 mg by mouth daily.  Marland Kitchen ELIQUIS 5 MG TABS tablet TAKE 1 TABLET(5 MG) BY MOUTH TWICE DAILY  . hydrochlorothiazide (MICROZIDE) 12.5 MG capsule Take 12.5 mg by mouth daily.   Marland Kitchen losartan (COZAAR) 100 MG tablet Take 100 mg by mouth daily.   . megestrol (MEGACE) 20 MG tablet Take 1 tablet twice a day     Allergies:   Hydrocodone   ROS:   No lightheadedness or syncope.  Prior CV studies:   The following studies were reviewed today:  Echocardiogram 03/29/2016: Study Conclusions  - Left ventricle: The cavity size was normal. Wall thickness was increased in a pattern of mild LVH. Systolic function was normal. The estimated ejection fraction was in the range of 55% to 60%. Wall motion was normal; there were no regional wall motion abnormalities. Left ventricular diastolic function parameters were normal for the patient&'s age. -  Aortic valve: Mildly calcified annulus. Trileaflet. - Mitral valve: There was mild regurgitation. - Left atrium: The atrium was mildly dilated. - Right atrium: Central venous pressure (est): 3 mm Hg. - Tricuspid valve: There was trivial regurgitation. - Pulmonary arteries: Systolic pressure could not be accurately estimated. - Pericardium, extracardiac: A small pericardial effusion was identified posterior to the heart.  Impressions:  - Mild LVH with LVEF 55-60% and grossly normal diastolic  function. Mild left atrial enlargement. Mild mitral regurgitation. Mildly sclerotic aortic annulus. Trivial tricuspid regurgitation. Small posterior pericardial effusion.  Labs/Other Tests and Data Reviewed:    EKG:  An ECG dated 12/30/2017 was personally reviewed today and demonstrated:  Sinus rhythm with nonspecific ST changes.  Recent Labs: 04/13/2020: ALT 19; BUN 22; Creatinine, Ser 1.21; Hemoglobin 12.8; Platelets 250; Potassium 3.5; Sodium 140    Wt Readings from Last 3 Encounters:  04/14/20 197 lb (89.4 kg)  11/13/19 197 lb (89.4 kg)  10/07/19 197 lb (89.4 kg)     Objective:    Vital Signs:  Ht 5\' 2"  (1.575 m)   Wt 197 lb (89.4 kg)   BMI 36.03 kg/m    Patient spoke in full sentences, not short of breath. No audible wheezing or coughing.  ASSESSMENT & PLAN:    1.  Paroxysmal atrial fibrillation with CHA2DS2-VASc score of 3.  She does not report any palpitations on medical therapy.  Continue Eliquis and Cardizem CD.  I went over her recent blood work.  2.  Essential hypertension.  She recalls recent systolic of 803 with visit per Dr. [name].  Continue losartan and HCTZ.   Time:   Today, I have spent 5 minutes with the patient with telehealth technology discussing the above problems.     Medication Adjustments/Labs and Tests Ordered: Current medicines are reviewed at length with the patient today.  Concerns regarding medicines are outlined above.   Tests Ordered: No orders of the defined types were placed in this encounter.   Medication Changes: No orders of the defined types were placed in this encounter.   Follow Up:  In Person 6 months in the [place] office.  Signed, Rozann Lesches, MD  04/14/2020 10:40 AM    [place]

## 2020-04-19 ENCOUNTER — Inpatient Hospital Stay (HOSPITAL_BASED_OUTPATIENT_CLINIC_OR_DEPARTMENT_OTHER): Payer: PPO | Admitting: Hematology

## 2020-04-19 ENCOUNTER — Other Ambulatory Visit: Payer: Self-pay

## 2020-04-19 DIAGNOSIS — D72829 Elevated white blood cell count, unspecified: Secondary | ICD-10-CM | POA: Diagnosis not present

## 2020-04-19 NOTE — Progress Notes (Signed)
Virtual Visit via Telephone Note  I connected with Vanessa Anderson on 04/19/20 at  4:15 PM EDT by telephone and verified that I am speaking with the correct person using two identifiers.   I discussed the limitations, risks, security and privacy concerns of performing an evaluation and management service by telephone and the availability of in person appointments. I also discussed with the patient that there may be a patient responsible charge related to this service. The patient expressed understanding and agreed to proceed.   History of Present Illness: She was followed in our clinic for leukocytosis which she had since 2018.   Observations/Objective: She denies any fevers, night sweats or weight loss in the last 6 months.  Denies any recurrent infections, ER visits or hospitalizations.  No antibiotic use in the last 6 months.  No palpable lymphadenopathy reported by patient.  Assessment and Plan:  1.  JAK2 V617F and BCR/ABL negative leukocytosis: -We reviewed labs from 04/13/2020.  White count is 12 with normal hemoglobin and platelet count.  Differential showed 69% neutrophils 21% lymphocytes and 8% monocytes. -She does not have any B symptoms or infections. -She will follow up with [place] in 6 months with repeat labs.  If there is any significant worsening, will consider bone marrow biopsy.  2.  CKD: -She has mild CKD and her creatinine has been stable around 1.21.   Follow Up Instructions: RTC 6 months with labs.   I discussed the assessment and treatment plan with the patient. The patient was provided an opportunity to ask questions and all were answered. The patient agreed with the plan and demonstrated an understanding of the instructions.   The patient was advised to call back or seek an in-person evaluation if the symptoms worsen or if the condition fails to improve as anticipated.  I provided 11 minutes of non-face-to-face time during this encounter.   Derek Jack,  MD

## 2020-04-21 DIAGNOSIS — N189 Chronic kidney disease, unspecified: Secondary | ICD-10-CM | POA: Diagnosis not present

## 2020-04-21 DIAGNOSIS — I129 Hypertensive chronic kidney disease with stage 1 through stage 4 chronic kidney disease, or unspecified chronic kidney disease: Secondary | ICD-10-CM | POA: Diagnosis not present

## 2020-04-21 DIAGNOSIS — J00 Acute nasopharyngitis [common cold]: Secondary | ICD-10-CM | POA: Diagnosis not present

## 2020-04-21 DIAGNOSIS — I10 Essential (primary) hypertension: Secondary | ICD-10-CM | POA: Diagnosis not present

## 2020-04-21 DIAGNOSIS — N1831 Chronic kidney disease, stage 3a: Secondary | ICD-10-CM | POA: Diagnosis not present

## 2020-04-21 DIAGNOSIS — I480 Paroxysmal atrial fibrillation: Secondary | ICD-10-CM | POA: Diagnosis not present

## 2020-04-21 DIAGNOSIS — J060 Acute laryngopharyngitis: Secondary | ICD-10-CM | POA: Diagnosis not present

## 2020-04-21 DIAGNOSIS — I482 Chronic atrial fibrillation, unspecified: Secondary | ICD-10-CM | POA: Diagnosis not present

## 2020-04-21 DIAGNOSIS — E7849 Other hyperlipidemia: Secondary | ICD-10-CM | POA: Diagnosis not present

## 2020-04-21 DIAGNOSIS — N390 Urinary tract infection, site not specified: Secondary | ICD-10-CM | POA: Diagnosis not present

## 2020-04-21 DIAGNOSIS — E669 Obesity, unspecified: Secondary | ICD-10-CM | POA: Diagnosis not present

## 2020-04-21 DIAGNOSIS — D72829 Elevated white blood cell count, unspecified: Secondary | ICD-10-CM | POA: Diagnosis not present

## 2020-04-21 DIAGNOSIS — E782 Mixed hyperlipidemia: Secondary | ICD-10-CM | POA: Diagnosis not present

## 2020-04-21 DIAGNOSIS — L292 Pruritus vulvae: Secondary | ICD-10-CM | POA: Diagnosis not present

## 2020-04-28 DIAGNOSIS — I10 Essential (primary) hypertension: Secondary | ICD-10-CM | POA: Diagnosis not present

## 2020-05-13 DIAGNOSIS — I482 Chronic atrial fibrillation, unspecified: Secondary | ICD-10-CM | POA: Diagnosis not present

## 2020-05-13 DIAGNOSIS — I10 Essential (primary) hypertension: Secondary | ICD-10-CM | POA: Diagnosis not present

## 2020-05-13 DIAGNOSIS — I4891 Unspecified atrial fibrillation: Secondary | ICD-10-CM | POA: Diagnosis not present

## 2020-05-13 DIAGNOSIS — E7849 Other hyperlipidemia: Secondary | ICD-10-CM | POA: Diagnosis not present

## 2020-05-27 DIAGNOSIS — I10 Essential (primary) hypertension: Secondary | ICD-10-CM | POA: Diagnosis not present

## 2020-05-31 ENCOUNTER — Other Ambulatory Visit: Payer: Self-pay

## 2020-06-06 DIAGNOSIS — E7849 Other hyperlipidemia: Secondary | ICD-10-CM | POA: Diagnosis not present

## 2020-06-06 DIAGNOSIS — I4891 Unspecified atrial fibrillation: Secondary | ICD-10-CM | POA: Diagnosis not present

## 2020-06-06 DIAGNOSIS — I482 Chronic atrial fibrillation, unspecified: Secondary | ICD-10-CM | POA: Diagnosis not present

## 2020-06-06 DIAGNOSIS — I10 Essential (primary) hypertension: Secondary | ICD-10-CM | POA: Diagnosis not present

## 2020-06-24 DIAGNOSIS — J060 Acute laryngopharyngitis: Secondary | ICD-10-CM | POA: Diagnosis not present

## 2020-06-24 DIAGNOSIS — Z7189 Other specified counseling: Secondary | ICD-10-CM | POA: Diagnosis not present

## 2020-06-24 DIAGNOSIS — N939 Abnormal uterine and vaginal bleeding, unspecified: Secondary | ICD-10-CM | POA: Diagnosis not present

## 2020-06-24 DIAGNOSIS — N390 Urinary tract infection, site not specified: Secondary | ICD-10-CM | POA: Diagnosis not present

## 2020-06-24 DIAGNOSIS — Z0001 Encounter for general adult medical examination with abnormal findings: Secondary | ICD-10-CM | POA: Diagnosis not present

## 2020-06-24 DIAGNOSIS — Z23 Encounter for immunization: Secondary | ICD-10-CM | POA: Diagnosis not present

## 2020-06-24 DIAGNOSIS — N1831 Chronic kidney disease, stage 3a: Secondary | ICD-10-CM | POA: Diagnosis not present

## 2020-06-24 DIAGNOSIS — E669 Obesity, unspecified: Secondary | ICD-10-CM | POA: Diagnosis not present

## 2020-06-24 DIAGNOSIS — I482 Chronic atrial fibrillation, unspecified: Secondary | ICD-10-CM | POA: Diagnosis not present

## 2020-06-24 DIAGNOSIS — J00 Acute nasopharyngitis [common cold]: Secondary | ICD-10-CM | POA: Diagnosis not present

## 2020-06-24 DIAGNOSIS — N189 Chronic kidney disease, unspecified: Secondary | ICD-10-CM | POA: Diagnosis not present

## 2020-06-24 DIAGNOSIS — I129 Hypertensive chronic kidney disease with stage 1 through stage 4 chronic kidney disease, or unspecified chronic kidney disease: Secondary | ICD-10-CM | POA: Diagnosis not present

## 2020-06-29 DIAGNOSIS — Z6831 Body mass index (BMI) 31.0-31.9, adult: Secondary | ICD-10-CM | POA: Diagnosis not present

## 2020-06-29 DIAGNOSIS — R7303 Prediabetes: Secondary | ICD-10-CM | POA: Diagnosis not present

## 2020-06-29 DIAGNOSIS — N8501 Benign endometrial hyperplasia: Secondary | ICD-10-CM | POA: Diagnosis not present

## 2020-06-29 DIAGNOSIS — I480 Paroxysmal atrial fibrillation: Secondary | ICD-10-CM | POA: Diagnosis not present

## 2020-06-29 DIAGNOSIS — E669 Obesity, unspecified: Secondary | ICD-10-CM | POA: Diagnosis not present

## 2020-06-29 DIAGNOSIS — D72829 Elevated white blood cell count, unspecified: Secondary | ICD-10-CM | POA: Diagnosis not present

## 2020-06-29 DIAGNOSIS — Z23 Encounter for immunization: Secondary | ICD-10-CM | POA: Diagnosis not present

## 2020-06-29 DIAGNOSIS — E782 Mixed hyperlipidemia: Secondary | ICD-10-CM | POA: Diagnosis not present

## 2020-06-29 DIAGNOSIS — I129 Hypertensive chronic kidney disease with stage 1 through stage 4 chronic kidney disease, or unspecified chronic kidney disease: Secondary | ICD-10-CM | POA: Diagnosis not present

## 2020-06-29 DIAGNOSIS — N1831 Chronic kidney disease, stage 3a: Secondary | ICD-10-CM | POA: Diagnosis not present

## 2020-07-12 DIAGNOSIS — E782 Mixed hyperlipidemia: Secondary | ICD-10-CM | POA: Diagnosis not present

## 2020-07-12 DIAGNOSIS — I4891 Unspecified atrial fibrillation: Secondary | ICD-10-CM | POA: Diagnosis not present

## 2020-07-12 DIAGNOSIS — I482 Chronic atrial fibrillation, unspecified: Secondary | ICD-10-CM | POA: Diagnosis not present

## 2020-07-12 DIAGNOSIS — I10 Essential (primary) hypertension: Secondary | ICD-10-CM | POA: Diagnosis not present

## 2020-07-20 DIAGNOSIS — I129 Hypertensive chronic kidney disease with stage 1 through stage 4 chronic kidney disease, or unspecified chronic kidney disease: Secondary | ICD-10-CM | POA: Diagnosis not present

## 2020-07-20 DIAGNOSIS — N1831 Chronic kidney disease, stage 3a: Secondary | ICD-10-CM | POA: Diagnosis not present

## 2020-08-05 DIAGNOSIS — Z23 Encounter for immunization: Secondary | ICD-10-CM | POA: Diagnosis not present

## 2020-08-08 DIAGNOSIS — E782 Mixed hyperlipidemia: Secondary | ICD-10-CM | POA: Diagnosis not present

## 2020-08-08 DIAGNOSIS — I4891 Unspecified atrial fibrillation: Secondary | ICD-10-CM | POA: Diagnosis not present

## 2020-08-08 DIAGNOSIS — Z23 Encounter for immunization: Secondary | ICD-10-CM | POA: Diagnosis not present

## 2020-08-08 DIAGNOSIS — I10 Essential (primary) hypertension: Secondary | ICD-10-CM | POA: Diagnosis not present

## 2020-08-08 DIAGNOSIS — I482 Chronic atrial fibrillation, unspecified: Secondary | ICD-10-CM | POA: Diagnosis not present

## 2020-08-26 ENCOUNTER — Other Ambulatory Visit (HOSPITAL_COMMUNITY): Payer: Self-pay | Admitting: Internal Medicine

## 2020-08-26 DIAGNOSIS — Z1231 Encounter for screening mammogram for malignant neoplasm of breast: Secondary | ICD-10-CM

## 2020-09-08 DIAGNOSIS — I10 Essential (primary) hypertension: Secondary | ICD-10-CM | POA: Diagnosis not present

## 2020-09-08 DIAGNOSIS — I4891 Unspecified atrial fibrillation: Secondary | ICD-10-CM | POA: Diagnosis not present

## 2020-09-08 DIAGNOSIS — I482 Chronic atrial fibrillation, unspecified: Secondary | ICD-10-CM | POA: Diagnosis not present

## 2020-09-08 DIAGNOSIS — Z23 Encounter for immunization: Secondary | ICD-10-CM | POA: Diagnosis not present

## 2020-09-08 DIAGNOSIS — E782 Mixed hyperlipidemia: Secondary | ICD-10-CM | POA: Diagnosis not present

## 2020-09-22 ENCOUNTER — Ambulatory Visit (HOSPITAL_COMMUNITY): Payer: PPO

## 2020-09-23 ENCOUNTER — Ambulatory Visit (HOSPITAL_COMMUNITY)
Admission: RE | Admit: 2020-09-23 | Discharge: 2020-09-23 | Disposition: A | Discharge status: Home / Self Care | Payer: PPO | Source: Ambulatory Visit | Attending: Internal Medicine | Admitting: Internal Medicine

## 2020-09-23 ENCOUNTER — Other Ambulatory Visit: Payer: Self-pay

## 2020-09-23 ENCOUNTER — Ambulatory Visit (HOSPITAL_COMMUNITY): Payer: PPO

## 2020-09-23 DIAGNOSIS — Z1231 Encounter for screening mammogram for malignant neoplasm of breast: Secondary | ICD-10-CM | POA: Insufficient documentation

## 2020-10-12 ENCOUNTER — Other Ambulatory Visit (HOSPITAL_COMMUNITY): Payer: PPO

## 2020-10-18 ENCOUNTER — Ambulatory Visit (INDEPENDENT_AMBULATORY_CARE_PROVIDER_SITE_OTHER): Payer: PPO | Admitting: Cardiology

## 2020-10-18 ENCOUNTER — Encounter: Payer: Self-pay | Admitting: Cardiology

## 2020-10-18 VITALS — BP 140/70 | HR 124 | Ht 62.0 in | Wt 195.4 lb

## 2020-10-18 DIAGNOSIS — I10 Essential (primary) hypertension: Secondary | ICD-10-CM | POA: Diagnosis not present

## 2020-10-18 DIAGNOSIS — I480 Paroxysmal atrial fibrillation: Secondary | ICD-10-CM

## 2020-10-18 MED ORDER — AMIODARONE HCL 200 MG PO TABS
200.0000 mg | ORAL_TABLET | Freq: Two times a day (BID) | ORAL | 3 refills | Status: DC
Start: 2020-10-18 — End: 2021-02-28

## 2020-10-18 NOTE — Progress Notes (Signed)
Cardiology Office Note  Date: 10/18/2020   ID: [name], [name] 1946/07/15, MRN [id number]  PCP:  Celene Squibb, MD  Cardiologist:  Rozann Lesches, MD Electrophysiologist:  None   Chief Complaint  Patient presents with  . Cardiac follow-up    History of Present Illness: Vanessa Anderson is a 74 y.o. female last assessed via telehealth encounter in June.  She presents for a routine visit.  Presented today with elevated heart rate and found to be in rapid atrial fibrillation by ECG.  She was not aware of any sense of palpitations or shortness of breath, duration is uncertain.  I reviewed her medications which are outlined below, she has been compliant with both Cartia XT and also Eliquis.  I reviewed her lab work from June per PCP.  Today we discussed initiation of amiodarone for rhythm suppression.  Past Medical History:  Diagnosis Date  . Chronic kidney disease   . Essential hypertension   . Fibromyalgia   . Hyperlipidemia   . Paroxysmal atrial fibrillation [facility])     Past Surgical History:  Procedure Laterality Date  . CESAREAN SECTION    . COLONOSCOPY N/A 05/28/2014   Procedure: COLONOSCOPY;  Surgeon: Danie Binder, MD;  Location[facility];  Service: Endoscopy;  Laterality: N/A;  8:30 AM  . HYSTEROSCOPY WITH D & C N/A 11/07/2016   Procedure: DILATATION AND CURETTAGE /HYSTEROSCOPY;  Surgeon: Florian Buff, MD;  Location: [facility] ORS;  Service: Gynecology;  Laterality: N/A;    Current Outpatient Medications  Medication Sig Dispense Refill  . atorvastatin (LIPITOR) 10 MG tablet Take 10 mg by mouth daily at 6 PM.     . Calcium Carbonate-Vitamin D (CALCIUM 500/VITAMIN D PO) Take 600 mg by mouth.    . CARTIA XT 120 MG 24 hr capsule TAKE 1 CAPSULE(120 MG) BY MOUTH DAILY 90 capsule 1  . cetirizine (ZYRTEC) 10 MG tablet Take 10 mg by mouth daily.    . cholecalciferol (VITAMIN D3) 25 MCG (1000 UNIT) tablet Take 1,000 Units by mouth daily.    Marland Kitchen ELIQUIS 5 MG TABS tablet TAKE  1 TABLET(5 MG) BY MOUTH TWICE DAILY 180 tablet 0  . megestrol (MEGACE) 20 MG tablet Take 1 tablet twice a day 60 tablet 11  . olmesartan-hydrochlorothiazide (BENICAR HCT) 40-25 MG tablet Take 1 tablet by mouth every morning.    Marland Kitchen amiodarone (PACERONE) 200 MG tablet Take 1 tablet (200 mg total) by mouth 2 (two) times daily. For 1 week, then reduce to 200 mg by mouth daily 45 tablet 3   No current facility-administered medications for this visit.   Allergies:  Hydrocodone   ROS: No orthopnea or PND.  Physical Exam: VS:  BP 140/70   Pulse (!) 124   Ht 5\' 2"  (1.575 m)   Wt 195 lb 6.4 oz (88.6 kg)   SpO2 96%   BMI 35.74 kg/m , BMI Body mass index is 35.74 kg/m.  Wt Readings from Last 3 Encounters:  10/18/20 195 lb 6.4 oz (88.6 kg)  04/14/20 197 lb (89.4 kg)  11/13/19 197 lb (89.4 kg)    General: Elderly woman, appears comfortable at rest. HEENT: Conjunctiva and lids normal, wearing a mask. Neck: Supple, no elevated JVP or carotid bruits, no thyromegaly. Lungs: Clear to auscultation, nonlabored breathing at rest. Cardiac: Irregularly irregular, no S3 or significant systolic murmur, no pericardial rub. Extremities: No pitting edema.  ECG:  An ECG dated 12/30/2017 was personally reviewed today and demonstrated:  Sinus rhythm with nonspecific ST changes.  Recent Labwork: 04/13/2020: ALT 19; AST 17; BUN 22; Creatinine, Ser 1.21; Hemoglobin 12.8; Platelets 250; Potassium 3.5; Sodium 140   Other Studies Reviewed Today:  Echocardiogram 03/29/2016: Study Conclusions  - Left ventricle: The cavity size was normal. Wall thickness was increased in a pattern of mild LVH. Systolic function was normal. The estimated ejection fraction was in the range of 55% to 60%. Wall motion was normal; there were no regional wall motion abnormalities. Left ventricular diastolic function parameters were normal for the patient&'s age. - Aortic valve: Mildly calcified annulus. Trileaflet. - Mitral  valve: There was mild regurgitation. - Left atrium: The atrium was mildly dilated. - Right atrium: Central venous pressure (est): 3 mm Hg. - Tricuspid valve: There was trivial regurgitation. - Pulmonary arteries: Systolic pressure could not be accurately estimated. - Pericardium, extracardiac: A small pericardial effusion was identified posterior to the heart.  Impressions:  - Mild LVH with LVEF 55-60% and grossly normal diastolic function. Mild left atrial enlargement. Mild mitral regurgitation. Mildly sclerotic aortic annulus. Trivial tricuspid regurgitation. Small posterior pericardial effusion.  Assessment and Plan:  1.  Paroxysmal to persistent atrial fibrillation.  Her CHA2DS2-VASc score is 3.  She was found to be in rapid atrial fibrillation today, although not aware of any sense of palpitations.  Duration is uncertain. Plan to continue Cartia XT and Eliquis.  Also starting amiodarone to provide better rhythm suppression.  I am concerned that if she has progressive episodes of rapid atrial fibrillation this may lead to cardiomyopathy.  2. Essential hypertension, also on Benicar HCT. Keep follow-up with Dr. [name].  Medication Adjustments/Labs and Tests Ordered: Current medicines are reviewed at length with the patient today.  Concerns regarding medicines are outlined above.   Tests Ordered: Orders Placed This Encounter  Procedures  . EKG 12-Lead    Medication Changes: Meds ordered this encounter  Medications  . amiodarone (PACERONE) 200 MG tablet    Sig: Take 1 tablet (200 mg total) by mouth 2 (two) times daily. For 1 week, then reduce to 200 mg by mouth daily    Dispense:  45 tablet    Refill:  3    10/18/2020 NEW    Disposition:  Follow up 1 week for nurse check with ECG.  Signed, Satira Sark, MD, [facility] 10/18/2020 12:15 PM    [facility] [place], [place], [place] [address] Phone: [phone]; Fax: [phone]

## 2020-10-18 NOTE — Patient Instructions (Addendum)
Medication Instructions:   Your physician has recommended you make the following change in your medication:   Start amiodarone 200 mg by mouth twice daily for one week; then, reduce to 200 mg by mouth daily  Continue other medications the same  Labwork:  None  Testing/Procedures:  None  Follow-Up:  Your physician recommends that you schedule a follow-up appointment in: 3 months in [place] recommends that you schedule a follow-up appointment in: 7-10 days for nurse visit to get a follow up EKG in [place].  Any Other Special Instructions Will Be Listed Below (If Applicable).  If you need a refill on your cardiac medications before your next appointment, please call your pharmacy.

## 2020-10-19 ENCOUNTER — Ambulatory Visit (HOSPITAL_COMMUNITY): Payer: PPO | Admitting: Nurse Practitioner

## 2020-10-25 ENCOUNTER — Ambulatory Visit (INDEPENDENT_AMBULATORY_CARE_PROVIDER_SITE_OTHER): Payer: PPO

## 2020-10-25 ENCOUNTER — Other Ambulatory Visit: Payer: Self-pay

## 2020-10-25 VITALS — BP 146/70 | HR 86

## 2020-10-25 DIAGNOSIS — I480 Paroxysmal atrial fibrillation: Secondary | ICD-10-CM | POA: Diagnosis not present

## 2020-10-25 NOTE — Patient Instructions (Signed)
Continue all your medications as before.   Follow in March as planned.   Thank you for choosing [facility] !

## 2020-10-25 NOTE — Progress Notes (Signed)
Nurse visit for EKG, was started on amiodarone 200 mg bid x 1 week on 10/18/20,  then 200 mg qd which she is now on for PAF.    EKG today is NSR   Patient will continue on same medications unless we tell her different.   I will forward to Dr.McDowell

## 2020-10-26 ENCOUNTER — Ambulatory Visit (HOSPITAL_COMMUNITY)
Admission: RE | Admit: 2020-10-26 | Discharge: 2020-10-26 | Disposition: A | Discharge status: Home / Self Care | Payer: PPO | Source: Ambulatory Visit | Attending: Internal Medicine | Admitting: Internal Medicine

## 2020-10-26 ENCOUNTER — Other Ambulatory Visit: Payer: Self-pay

## 2020-10-26 ENCOUNTER — Inpatient Hospital Stay (HOSPITAL_COMMUNITY): Payer: PPO | Attending: Medical

## 2020-10-26 DIAGNOSIS — D72829 Elevated white blood cell count, unspecified: Secondary | ICD-10-CM | POA: Insufficient documentation

## 2020-10-26 DIAGNOSIS — Z79899 Other long term (current) drug therapy: Secondary | ICD-10-CM | POA: Insufficient documentation

## 2020-10-26 DIAGNOSIS — N189 Chronic kidney disease, unspecified: Secondary | ICD-10-CM | POA: Diagnosis not present

## 2020-10-26 DIAGNOSIS — Z7901 Long term (current) use of anticoagulants: Secondary | ICD-10-CM | POA: Insufficient documentation

## 2020-10-26 DIAGNOSIS — I480 Paroxysmal atrial fibrillation: Secondary | ICD-10-CM | POA: Diagnosis not present

## 2020-10-26 DIAGNOSIS — I129 Hypertensive chronic kidney disease with stage 1 through stage 4 chronic kidney disease, or unspecified chronic kidney disease: Secondary | ICD-10-CM | POA: Insufficient documentation

## 2020-10-26 DIAGNOSIS — Z1231 Encounter for screening mammogram for malignant neoplasm of breast: Secondary | ICD-10-CM | POA: Diagnosis not present

## 2020-10-26 DIAGNOSIS — E785 Hyperlipidemia, unspecified: Secondary | ICD-10-CM | POA: Insufficient documentation

## 2020-10-26 LAB — CBC WITH DIFFERENTIAL/PLATELET
Abs Immature Granulocytes: 0.07 10*3/uL (ref 0.00–0.07)
Basophils Absolute: 0.1 10*3/uL (ref 0.0–0.1)
Basophils Relative: 1 %
Eosinophils Absolute: 0.1 10*3/uL (ref 0.0–0.5)
Eosinophils Relative: 1 %
HCT: 38.8 % (ref 36.0–46.0)
Hemoglobin: 12.2 g/dL (ref 12.0–15.0)
Immature Granulocytes: 1 %
Lymphocytes Relative: 25 %
Lymphs Abs: 2.7 10*3/uL (ref 0.7–4.0)
MCH: 29.1 pg (ref 26.0–34.0)
MCHC: 31.4 g/dL (ref 30.0–36.0)
MCV: 92.6 fL (ref 80.0–100.0)
Monocytes Absolute: 0.7 10*3/uL (ref 0.1–1.0)
Monocytes Relative: 7 %
Neutro Abs: 7.4 10*3/uL (ref 1.7–7.7)
Neutrophils Relative %: 65 %
Platelets: 239 10*3/uL (ref 150–400)
RBC: 4.19 MIL/uL (ref 3.87–5.11)
RDW: 14.1 % (ref 11.5–15.5)
WBC: 11 10*3/uL — ABNORMAL HIGH (ref 4.0–10.5)
nRBC: 0 % (ref 0.0–0.2)

## 2020-10-26 LAB — LACTATE DEHYDROGENASE: LDH: 116 U/L (ref 98–192)

## 2020-11-02 ENCOUNTER — Inpatient Hospital Stay (HOSPITAL_COMMUNITY): Payer: PPO | Admitting: Hematology

## 2020-11-02 ENCOUNTER — Other Ambulatory Visit: Payer: Self-pay

## 2020-11-02 VITALS — BP 153/64 | HR 71 | Temp 98.7°F | Resp 18 | Wt 196.4 lb

## 2020-11-02 DIAGNOSIS — D72829 Elevated white blood cell count, unspecified: Secondary | ICD-10-CM

## 2020-11-02 NOTE — Progress Notes (Signed)
Vanessa Anderson, [place] [id number]   CLINIC:  Medical Oncology/Hematology  PCP:  Vanessa Squibb, MD [address] Vanessa [place] [address]  [phone]  REASON FOR VISIT:  Follow-up for leukocytosis  PRIOR THERAPY: None  CURRENT THERAPY: Observation  INTERVAL HISTORY:  [name], a 74 y.o. female, returns for routine follow-up for her leukocytosis. [name] was last contacted via telephone on 04/19/2020.  Today she reports feeling well. She denies having any recent infections, F/C, night sweats, weight loss, or adenopathy.   REVIEW OF SYSTEMS:  Review of Systems  Constitutional: Negative for appetite change, chills, diaphoresis, fatigue, fever and unexpected weight change.  Hematological: Negative for adenopathy.  All other systems reviewed and are negative.   PAST MEDICAL/SURGICAL HISTORY:  Past Medical History:  Diagnosis Date  . Chronic kidney disease   . Essential hypertension   . Fibromyalgia   . Hyperlipidemia   . Paroxysmal atrial fibrillation [facility])    Past Surgical History:  Procedure Laterality Date  . CESAREAN SECTION    . COLONOSCOPY N/A 05/28/2014   Procedure: COLONOSCOPY;  Surgeon: Danie Binder, MD;  Location: [facility];  Service: Endoscopy;  Laterality: N/A;  8:30 AM  . HYSTEROSCOPY WITH D & C N/A 11/07/2016   Procedure: DILATATION AND CURETTAGE /HYSTEROSCOPY;  Surgeon: Florian Buff, MD;  Location: [facility] ORS;  Service: Gynecology;  Laterality: N/A;    SOCIAL HISTORY:  Social History   Socioeconomic History  . Marital status: Married    Spouse name: Not on file  . Number of children: 2  . Years of education: Not on file  . Highest education level: Not on file  Occupational History  . Not on file  Tobacco Use  . Smoking status: Never Smoker  . Smokeless tobacco: Never Used  Vaping Use  . Vaping Use: Never used  Substance and Sexual Activity  . Alcohol use: No    Alcohol/week: 0.0 standard drinks   . Drug use: No  . Sexual activity: Not Currently  Other Topics Concern  . Not on file  Social History Narrative  . Not on file   Social Determinants of Health   Financial Resource Strain: Not on file  Food Insecurity: Not on file  Transportation Needs: Not on file  Physical Activity: Not on file  Stress: Not on file  Social Connections: Not on file  Intimate Partner Violence: Not on file    FAMILY HISTORY:  Family History  Problem Relation Age of Onset  . Heart attack Father     CURRENT MEDICATIONS:  Current Outpatient Medications  Medication Sig Dispense Refill  . amiodarone (PACERONE) 200 MG tablet Take 1 tablet (200 mg total) by mouth 2 (two) times daily. For 1 week, then reduce to 200 mg by mouth daily 45 tablet 3  . atorvastatin (LIPITOR) 10 MG tablet Take 10 mg by mouth daily at 6 PM.     . Calcium Carbonate-Vitamin D (CALCIUM 500/VITAMIN D PO) Take 600 mg by mouth.    . CARTIA XT 120 MG 24 hr capsule TAKE 1 CAPSULE(120 MG) BY MOUTH DAILY 90 capsule 1  . cetirizine (ZYRTEC) 10 MG tablet Take 10 mg by mouth daily.    . cholecalciferol (VITAMIN D3) 25 MCG (1000 UNIT) tablet Take 1,000 Units by mouth daily.    Marland Kitchen ELIQUIS 5 MG TABS tablet TAKE 1 TABLET(5 MG) BY MOUTH TWICE DAILY 180 tablet 0  . megestrol (MEGACE)  20 MG tablet Take 1 tablet twice a day 60 tablet 11  . olmesartan-hydrochlorothiazide (BENICAR HCT) 40-25 MG tablet Take 1 tablet by mouth every morning.     No current facility-administered medications for this visit.    ALLERGIES:  Allergies  Allergen Reactions  . Hydrocodone Itching    PHYSICAL EXAM:  Performance status (ECOG): 1 - Symptomatic but completely ambulatory  Vitals:   11/02/20 1110  BP: (!) 153/64  Pulse: 71  Resp: 18  Temp: 98.7 F (37.1 C)  SpO2: 100%   Wt Readings from Last 3 Encounters:  11/02/20 196 lb 6.4 oz (89.1 kg)  10/18/20 195 lb 6.4 oz (88.6 kg)  04/14/20 197 lb (89.4 kg)   Physical Exam Vitals reviewed.   Constitutional:      Appearance: Normal appearance. She is obese.  Cardiovascular:     Rate and Rhythm: Normal rate and regular rhythm.     Pulses: Normal pulses.     Heart sounds: Normal heart sounds.  Pulmonary:     Effort: Pulmonary effort is normal.     Breath sounds: Normal breath sounds.  Abdominal:     Palpations: Abdomen is soft. There is no hepatomegaly, splenomegaly or mass.     Tenderness: There is no abdominal tenderness.     Hernia: No hernia is present.  Musculoskeletal:     Right lower leg: No edema.     Left lower leg: No edema.  Lymphadenopathy:     Lower Body: No right inguinal adenopathy. No left inguinal adenopathy.  Neurological:     General: No focal deficit present.     Mental Status: She is alert and oriented to person, place, and time.  Psychiatric:        Mood and Affect: Mood normal.        Behavior: Behavior normal.     LABORATORY DATA:  I have reviewed the labs as listed.  CBC Latest Ref Rng & Units 10/26/2020 04/13/2020 09/29/2019  WBC 4.0 - 10.5 K/uL 11.0(H) 12.0(H) 13.2(H)  Hemoglobin 12.0 - 15.0 g/dL 12.2 12.8 13.2  Hematocrit 36.0 - 46.0 % 38.8 40.7 43.8  Platelets 150 - 400 K/uL 239 250 270   CMP Latest Ref Rng & Units 04/13/2020 09/29/2019 05/25/2019  Glucose 70 - 99 mg/dL 106(H) 117(H) 113(H)  BUN 8 - 23 mg/dL _0 Creatinine 0.44 - 1.00 mg/dL 1.21(H) 1.33(H) 1.21(H)  Sodium 135 - 145 mmol/L 140 141 141  Potassium 3.5 - 5.1 mmol/L 3.5 3.8 3.4(L)  Chloride 98 - 111 mmol/L 105 108 111  CO2 22 - 32 mmol/L _1 Calcium 8.9 - 10.3 mg/dL 9.9 9.9 9.8  Total Protein 6.5 - 8.1 g/dL 8.1 8.2(H) 7.7  Total Bilirubin 0.3 - 1.2 mg/dL 0.7 0.5 0.7  Alkaline Phos 38 - 126 U/L 113 121 127(H)  AST 15 - 41 U/L _2 ALT 0 - 44 U/L _3 Component Value Date/Time   RBC 4.19 10/26/2020 1128   MCV 92.6 10/26/2020 1128   MCH 29.1 10/26/2020 1128   MCHC 31.4 10/26/2020 1128   RDW 14.1 10/26/2020 1128   LYMPHSABS 2.7 10/26/2020  1128   MONOABS 0.7 10/26/2020 1128   EOSABS 0.1 10/26/2020 1128   BASOSABS 0.1 10/26/2020 1128   Lab Results  Component Value Date   LDH 116 10/26/2020   LDH 124 04/13/2020   LDH 138 09/29/2019    DIAGNOSTIC IMAGING:  I have independently  reviewed the scans and discussed with the patient. MM 3D SCREEN BREAST BILATERAL  Result Date: 10/31/2020 CLINICAL DATA:  Screening. EXAM: DIGITAL SCREENING BILATERAL MAMMOGRAM WITH TOMO AND CAD COMPARISON:  Previous exam(s). ACR Breast Density Category c: The breast tissue is heterogeneously dense, which may obscure small masses. FINDINGS: There are no findings suspicious for malignancy. Images were processed with CAD. IMPRESSION: No mammographic evidence of malignancy. A result letter of this screening mammogram will be mailed directly to the patient. RECOMMENDATION: Screening mammogram in one year. (Code:SM-B-01Y) BI-RADS CATEGORY  1: Negative. Electronically Signed   By: Lajean Manes M.D.   On: 10/31/2020 11:04     ASSESSMENT:  1.  JAK2 V617F and BCR/ABL negative leukocytosis: -We reviewed labs from 04/13/2020.  White count is 12 with normal hemoglobin and platelet count.  Differential showed 69% neutrophils 21% lymphocytes and 8% monocytes. -She does not have any B symptoms or infections. -She will follow up with [place] in 6 months with repeat labs.  If there is any significant worsening, will consider bone marrow biopsy.  2.  CKD: -She has mild CKD and her creatinine has been stable around 1.21.   PLAN:  1.  JAK2 V617F and BCR/ABL negative leukocytosis: -She does not have any B symptoms.  No palpable adenopathy or splenomegaly. -Reviewed labs from 10/26/2020.  White count is improved 11.  Differential is normal.  Hemoglobin and platelets are normal.  LDH was 116. -RTC 1 year with labs and physical exam.   Orders placed this encounter:  No orders of the defined types were placed in this encounter.    Derek Jack, MD [place] [phone]   I, Milinda Antis, am acting as a scribe for Dr. Sanda Linger.  I, Derek Jack MD, have reviewed the above documentation for accuracy and completeness, and I agree with the above.

## 2020-11-02 NOTE — Patient Instructions (Signed)
[facility] at [facility] Discharge Instructions  You were seen today by Dr. Ellin Saba. He went over your recent results. Your next visit will be with the nurse practitioner in 1 year for labs and follow up.   Thank you for choosing  Cancer Center at [facility] to provide your oncology and hematology care.  To afford each patient quality time with our provider, please arrive at least 15 minutes before your scheduled appointment time.   If you have a lab appointment with the Cancer Center please come in thru the Main Entrance and check in at the main information desk  You need to re-schedule your appointment should you arrive 10 or more minutes late.  We strive to give you quality time with our providers, and arriving late affects you and other patients whose appointments are after yours.  Also, if you no show three or more times for appointments you may be dismissed from the clinic at the providers discretion.     Again, thank you for choosing [facility].  Our hope is that these requests will decrease the amount of time that you wait before being seen by our physicians.       _____________________________________________________________  Should you have questions after your visit to [facility], please contact our office at [phone] between the hours of 8:00 a.m. and 4:30 p.m.  Voicemails left after 4:00 p.m. will not be returned until the following business day.  For prescription refill requests, have your pharmacy contact our office and allow 72 hours.    Cancer Center Support Programs:   > Cancer Support Group  2nd Tuesday of the month 1pm-2pm, Journey Room

## 2020-11-04 DIAGNOSIS — I482 Chronic atrial fibrillation, unspecified: Secondary | ICD-10-CM | POA: Diagnosis not present

## 2020-11-04 DIAGNOSIS — Z23 Encounter for immunization: Secondary | ICD-10-CM | POA: Diagnosis not present

## 2020-11-04 DIAGNOSIS — E782 Mixed hyperlipidemia: Secondary | ICD-10-CM | POA: Diagnosis not present

## 2020-11-04 DIAGNOSIS — I4891 Unspecified atrial fibrillation: Secondary | ICD-10-CM | POA: Diagnosis not present

## 2020-11-04 DIAGNOSIS — I10 Essential (primary) hypertension: Secondary | ICD-10-CM | POA: Diagnosis not present

## 2020-12-03 DIAGNOSIS — I10 Essential (primary) hypertension: Secondary | ICD-10-CM | POA: Diagnosis not present

## 2020-12-03 DIAGNOSIS — Z23 Encounter for immunization: Secondary | ICD-10-CM | POA: Diagnosis not present

## 2020-12-03 DIAGNOSIS — I482 Chronic atrial fibrillation, unspecified: Secondary | ICD-10-CM | POA: Diagnosis not present

## 2020-12-03 DIAGNOSIS — E782 Mixed hyperlipidemia: Secondary | ICD-10-CM | POA: Diagnosis not present

## 2020-12-03 DIAGNOSIS — I4891 Unspecified atrial fibrillation: Secondary | ICD-10-CM | POA: Diagnosis not present

## 2021-01-02 DIAGNOSIS — E782 Mixed hyperlipidemia: Secondary | ICD-10-CM | POA: Diagnosis not present

## 2021-01-02 DIAGNOSIS — I10 Essential (primary) hypertension: Secondary | ICD-10-CM | POA: Diagnosis not present

## 2021-01-02 DIAGNOSIS — I4891 Unspecified atrial fibrillation: Secondary | ICD-10-CM | POA: Diagnosis not present

## 2021-01-02 DIAGNOSIS — I482 Chronic atrial fibrillation, unspecified: Secondary | ICD-10-CM | POA: Diagnosis not present

## 2021-01-18 NOTE — Progress Notes (Unsigned)
Cardiology Office Note  Date: 01/19/2021   ID: Vanessa, Anderson 01/23/1946, MRN [id number]  PCP:  Celene Squibb, MD  Cardiologist:  Rozann Lesches, MD Electrophysiologist:  None   Chief Complaint  Patient presents with  . Cardiac follow-up    History of Present Illness: Vanessa Anderson is a 75 y.o. female last seen in December 2021.  She presents for a follow-up visit. Amiodarone was initiated at the last visit for suppression of paroxysmal atrial fibrillation.  She had a follow-up ECG after initial loading dose with return to sinus rhythm.  She does not feel any palpitations, even when she is in atrial fibrillation.  Heart rate is normal today.  She plans to have follow-up lab work with physical at Dr. Juel Burrow office in early April.  I reviewed her medications which are outlined below.  Past Medical History:  Diagnosis Date  . Chronic kidney disease   . Essential hypertension   . Fibromyalgia   . Hyperlipidemia   . Paroxysmal atrial fibrillation [facility])     Past Surgical History:  Procedure Laterality Date  . CESAREAN SECTION    . COLONOSCOPY N/A 05/28/2014   Procedure: COLONOSCOPY;  Surgeon: Danie Binder, MD;  Location[facility];  Service: Endoscopy;  Laterality: N/A;  8:30 AM  . HYSTEROSCOPY WITH D & C N/A 11/07/2016   Procedure: DILATATION AND CURETTAGE /HYSTEROSCOPY;  Surgeon: Florian Buff, MD;  Location: [facility] ORS;  Service: Gynecology;  Laterality: N/A;    Current Outpatient Medications  Medication Sig Dispense Refill  . amiodarone (PACERONE) 200 MG tablet Take 1 tablet (200 mg total) by mouth 2 (two) times daily. For 1 week, then reduce to 200 mg by mouth daily 45 tablet 3  . atorvastatin (LIPITOR) 10 MG tablet Take 10 mg by mouth daily at 6 PM.     . Calcium Carbonate-Vitamin D (CALCIUM 500/VITAMIN D PO) Take 600 mg by mouth.    . CARTIA XT 120 MG 24 hr capsule TAKE 1 CAPSULE(120 MG) BY MOUTH DAILY 90 capsule 1  . cetirizine (ZYRTEC) 10 MG tablet Take 10 mg  by mouth daily.    . cholecalciferol (VITAMIN D3) 25 MCG (1000 UNIT) tablet Take 1,000 Units by mouth daily.    Marland Kitchen ELIQUIS 5 MG TABS tablet TAKE 1 TABLET(5 MG) BY MOUTH TWICE DAILY 180 tablet 0  . megestrol (MEGACE) 20 MG tablet Take 1 tablet twice a day 60 tablet 11  . olmesartan-hydrochlorothiazide (BENICAR HCT) 40-25 MG tablet Take 1 tablet by mouth every morning.    Marland Kitchen DILT-XR 120 MG 24 hr capsule Take 120 mg by mouth every morning.     No current facility-administered medications for this visit.   Allergies:  Hydrocodone   ROS: No palpitations or syncope.  Physical Exam: VS:  BP (!) 148/70   Pulse (!) 59   Ht 5\' 2"  (1.575 m)   Wt 198 lb (89.8 kg)   SpO2 99%   BMI 36.21 kg/m , BMI Body mass index is 36.21 kg/m.  Wt Readings from Last 3 Encounters:  01/19/21 198 lb (89.8 kg)  11/02/20 196 lb 6.4 oz (89.1 kg)  10/18/20 195 lb 6.4 oz (88.6 kg)    General: Elderly woman, appears comfortable at rest. HEENT: Conjunctiva and lids normal, wearing a mask. Neck: Supple, no elevated JVP or carotid bruits, no thyromegaly. Lungs: Clear to auscultation, nonlabored breathing at rest. Cardiac: Regular rate and rhythm, no S3 or significant systolic murmur, no pericardial  rub. Extremities: No pitting edema.  ECG:  An ECG dated 10/25/2020 was personally reviewed today and demonstrated:  Normal sinus rhythm.  Recent Labwork: 04/13/2020: ALT 19; AST 17; BUN 22; Creatinine, Ser 1.21; Potassium 3.5; Sodium 140 10/26/2020: Hemoglobin 12.2; Platelets 239   Other Studies Reviewed Today:  Echocardiogram 03/29/2016: Study Conclusions  - Left ventricle: The cavity size was normal. Wall thickness was increased in a pattern of mild LVH. Systolic function was normal. The estimated ejection fraction was in the range of 55% to 60%. Wall motion was normal; there were no regional wall motion abnormalities. Left ventricular diastolic function parameters were normal for the patient&'s age. -  Aortic valve: Mildly calcified annulus. Trileaflet. - Mitral valve: There was mild regurgitation. - Left atrium: The atrium was mildly dilated. - Right atrium: Central venous pressure (est): 3 mm Hg. - Tricuspid valve: There was trivial regurgitation. - Pulmonary arteries: Systolic pressure could not be accurately estimated. - Pericardium, extracardiac: A small pericardial effusion was identified posterior to the heart.  Impressions:  - Mild LVH with LVEF 55-60% and grossly normal diastolic function. Mild left atrial enlargement. Mild mitral regurgitation. Mildly sclerotic aortic annulus. Trivial tricuspid regurgitation. Small posterior pericardial effusion.  Assessment and Plan:  1.  Persistent atrial fibrillation.  CHA2DS2-VASc score is 3.  She is now on amiodarone for rhythm suppression, also continues on Cartia XT and Eliquis.  Follow-up lab work as scheduled with Dr. [name] in April.  2.  Essential hypertension, continue Benicar HCT.  Medication Adjustments/Labs and Tests Ordered: Current medicines are reviewed at length with the patient today.  Concerns regarding medicines are outlined above.   Tests Ordered: No orders of the defined types were placed in this encounter.   Medication Changes: No orders of the defined types were placed in this encounter.   Disposition:  Follow up 6 months in the [place] office.  Signed, Satira Sark, MD, [facility] 01/19/2021 1:38 PM    [place] at [facility] [address], [place], [place] [address] Phone: [phone]; Fax: [phone]

## 2021-01-19 ENCOUNTER — Ambulatory Visit: Payer: PPO | Admitting: Cardiology

## 2021-01-19 ENCOUNTER — Encounter: Payer: Self-pay | Admitting: Cardiology

## 2021-01-19 ENCOUNTER — Other Ambulatory Visit: Payer: Self-pay

## 2021-01-19 VITALS — BP 148/70 | HR 59 | Ht 62.0 in | Wt 198.0 lb

## 2021-01-19 DIAGNOSIS — I480 Paroxysmal atrial fibrillation: Secondary | ICD-10-CM

## 2021-01-19 DIAGNOSIS — I10 Essential (primary) hypertension: Secondary | ICD-10-CM | POA: Diagnosis not present

## 2021-01-19 NOTE — Patient Instructions (Signed)
Medication Instructions:  °Your physician recommends that you continue on your current medications as directed. Please refer to the Current Medication list given to you today. ° °*If you need a refill on your cardiac medications before your next appointment, please call your pharmacy* ° ° °Lab Work: °None today °If you have labs (blood work) drawn today and your tests are completely normal, you will receive your results only by: °• MyChart Message (if you have MyChart) OR °• A paper copy in the mail °If you have any lab test that is abnormal or we need to change your treatment, we will call you to review the results. ° ° °Testing/Procedures: °None today ° ° °Follow-Up: °At [facility], you and your health needs are our priority.  As part of our continuing mission to provide you with exceptional heart care, we have created designated Provider Care Teams.  These Care Teams include your primary Cardiologist (physician) and Advanced Practice Providers (APPs -  Physician Assistants and Nurse Practitioners) who all work together to provide you with the care you need, when you need it. ° °We recommend signing up for the patient portal called "MyChart".  Sign up information is provided on this After Visit Summary.  MyChart is used to connect with patients for Virtual Visits (Telemedicine).  Patients are able to view lab/test results, encounter notes, upcoming appointments, etc.  Non-urgent messages can be sent to your provider as well.   °To learn more about what you can do with MyChart, go to [email or web address].   ° °Your next appointment:   °6 month(s) ° °The format for your next appointment:   °In Person ° °Provider:   °Samuel McDowell, MD ° ° °Other Instructions °None ° ° ° ° °Thank you for choosing [facility] ! ° ° ° ° ° ° ° ° °

## 2021-02-01 DIAGNOSIS — I10 Essential (primary) hypertension: Secondary | ICD-10-CM | POA: Diagnosis not present

## 2021-02-01 DIAGNOSIS — E782 Mixed hyperlipidemia: Secondary | ICD-10-CM | POA: Diagnosis not present

## 2021-02-01 DIAGNOSIS — I4891 Unspecified atrial fibrillation: Secondary | ICD-10-CM | POA: Diagnosis not present

## 2021-02-01 DIAGNOSIS — I482 Chronic atrial fibrillation, unspecified: Secondary | ICD-10-CM | POA: Diagnosis not present

## 2021-02-07 DIAGNOSIS — E782 Mixed hyperlipidemia: Secondary | ICD-10-CM | POA: Diagnosis not present

## 2021-02-07 DIAGNOSIS — R7303 Prediabetes: Secondary | ICD-10-CM | POA: Diagnosis not present

## 2021-02-07 DIAGNOSIS — D72829 Elevated white blood cell count, unspecified: Secondary | ICD-10-CM | POA: Diagnosis not present

## 2021-02-07 DIAGNOSIS — E7849 Other hyperlipidemia: Secondary | ICD-10-CM | POA: Diagnosis not present

## 2021-02-07 DIAGNOSIS — Z6824 Body mass index (BMI) 24.0-24.9, adult: Secondary | ICD-10-CM | POA: Diagnosis not present

## 2021-02-07 DIAGNOSIS — R7301 Impaired fasting glucose: Secondary | ICD-10-CM | POA: Diagnosis not present

## 2021-02-07 DIAGNOSIS — N189 Chronic kidney disease, unspecified: Secondary | ICD-10-CM | POA: Diagnosis not present

## 2021-02-07 DIAGNOSIS — I10 Essential (primary) hypertension: Secondary | ICD-10-CM | POA: Diagnosis not present

## 2021-02-07 DIAGNOSIS — I129 Hypertensive chronic kidney disease with stage 1 through stage 4 chronic kidney disease, or unspecified chronic kidney disease: Secondary | ICD-10-CM | POA: Diagnosis not present

## 2021-02-07 DIAGNOSIS — N1831 Chronic kidney disease, stage 3a: Secondary | ICD-10-CM | POA: Diagnosis not present

## 2021-02-07 DIAGNOSIS — Z6829 Body mass index (BMI) 29.0-29.9, adult: Secondary | ICD-10-CM | POA: Diagnosis not present

## 2021-02-15 DIAGNOSIS — E669 Obesity, unspecified: Secondary | ICD-10-CM | POA: Diagnosis not present

## 2021-02-15 DIAGNOSIS — R7303 Prediabetes: Secondary | ICD-10-CM | POA: Diagnosis not present

## 2021-02-15 DIAGNOSIS — I129 Hypertensive chronic kidney disease with stage 1 through stage 4 chronic kidney disease, or unspecified chronic kidney disease: Secondary | ICD-10-CM | POA: Diagnosis not present

## 2021-02-15 DIAGNOSIS — E782 Mixed hyperlipidemia: Secondary | ICD-10-CM | POA: Diagnosis not present

## 2021-02-15 DIAGNOSIS — N8501 Benign endometrial hyperplasia: Secondary | ICD-10-CM | POA: Diagnosis not present

## 2021-02-15 DIAGNOSIS — N1831 Chronic kidney disease, stage 3a: Secondary | ICD-10-CM | POA: Diagnosis not present

## 2021-02-15 DIAGNOSIS — Z6831 Body mass index (BMI) 31.0-31.9, adult: Secondary | ICD-10-CM | POA: Diagnosis not present

## 2021-02-15 DIAGNOSIS — D72829 Elevated white blood cell count, unspecified: Secondary | ICD-10-CM | POA: Diagnosis not present

## 2021-02-15 DIAGNOSIS — I480 Paroxysmal atrial fibrillation: Secondary | ICD-10-CM | POA: Diagnosis not present

## 2021-02-28 ENCOUNTER — Other Ambulatory Visit: Payer: Self-pay | Admitting: Cardiology

## 2021-03-05 DIAGNOSIS — I10 Essential (primary) hypertension: Secondary | ICD-10-CM | POA: Diagnosis not present

## 2021-03-05 DIAGNOSIS — I129 Hypertensive chronic kidney disease with stage 1 through stage 4 chronic kidney disease, or unspecified chronic kidney disease: Secondary | ICD-10-CM | POA: Diagnosis not present

## 2021-03-22 DIAGNOSIS — I10 Essential (primary) hypertension: Secondary | ICD-10-CM | POA: Diagnosis not present

## 2021-03-22 DIAGNOSIS — R7303 Prediabetes: Secondary | ICD-10-CM | POA: Diagnosis not present

## 2021-03-22 DIAGNOSIS — E785 Hyperlipidemia, unspecified: Secondary | ICD-10-CM | POA: Diagnosis not present

## 2021-03-29 DIAGNOSIS — I129 Hypertensive chronic kidney disease with stage 1 through stage 4 chronic kidney disease, or unspecified chronic kidney disease: Secondary | ICD-10-CM | POA: Diagnosis not present

## 2021-04-04 DIAGNOSIS — E7800 Pure hypercholesterolemia, unspecified: Secondary | ICD-10-CM | POA: Diagnosis not present

## 2021-04-04 DIAGNOSIS — I10 Essential (primary) hypertension: Secondary | ICD-10-CM | POA: Diagnosis not present

## 2021-05-04 DIAGNOSIS — I129 Hypertensive chronic kidney disease with stage 1 through stage 4 chronic kidney disease, or unspecified chronic kidney disease: Secondary | ICD-10-CM | POA: Diagnosis not present

## 2021-05-04 DIAGNOSIS — I10 Essential (primary) hypertension: Secondary | ICD-10-CM | POA: Diagnosis not present

## 2021-05-04 DIAGNOSIS — E7800 Pure hypercholesterolemia, unspecified: Secondary | ICD-10-CM | POA: Diagnosis not present

## 2021-05-05 DIAGNOSIS — U071 COVID-19: Secondary | ICD-10-CM | POA: Diagnosis not present

## 2021-05-05 DIAGNOSIS — R070 Pain in throat: Secondary | ICD-10-CM | POA: Diagnosis not present

## 2021-05-05 DIAGNOSIS — R059 Cough, unspecified: Secondary | ICD-10-CM | POA: Diagnosis not present

## 2021-06-04 DIAGNOSIS — I10 Essential (primary) hypertension: Secondary | ICD-10-CM | POA: Diagnosis not present

## 2021-06-04 DIAGNOSIS — I129 Hypertensive chronic kidney disease with stage 1 through stage 4 chronic kidney disease, or unspecified chronic kidney disease: Secondary | ICD-10-CM | POA: Diagnosis not present

## 2021-06-04 DIAGNOSIS — E7800 Pure hypercholesterolemia, unspecified: Secondary | ICD-10-CM | POA: Diagnosis not present

## 2021-06-20 ENCOUNTER — Ambulatory Visit: Payer: PPO | Admitting: Cardiology

## 2021-06-20 ENCOUNTER — Other Ambulatory Visit (HOSPITAL_COMMUNITY)
Admission: RE | Admit: 2021-06-20 | Discharge: 2021-06-20 | Disposition: A | Discharge status: Home / Self Care | Payer: PPO | Source: Ambulatory Visit | Attending: Cardiology | Admitting: Cardiology

## 2021-06-20 ENCOUNTER — Other Ambulatory Visit: Payer: Self-pay

## 2021-06-20 ENCOUNTER — Encounter: Payer: Self-pay | Admitting: Cardiology

## 2021-06-20 VITALS — BP 142/78 | HR 73 | Ht 62.0 in | Wt 192.0 lb

## 2021-06-20 DIAGNOSIS — I10 Essential (primary) hypertension: Secondary | ICD-10-CM | POA: Diagnosis not present

## 2021-06-20 DIAGNOSIS — I480 Paroxysmal atrial fibrillation: Secondary | ICD-10-CM

## 2021-06-20 LAB — HEPATIC FUNCTION PANEL
ALT: 18 U/L (ref 0–44)
AST: 15 U/L (ref 15–41)
Albumin: 4.2 g/dL (ref 3.5–5.0)
Alkaline Phosphatase: 125 U/L (ref 38–126)
Bilirubin, Direct: 0.1 mg/dL (ref 0.0–0.2)
Indirect Bilirubin: 0.5 mg/dL (ref 0.3–0.9)
Total Bilirubin: 0.6 mg/dL (ref 0.3–1.2)
Total Protein: 7.6 g/dL (ref 6.5–8.1)

## 2021-06-20 LAB — TSH: TSH: 14.976 u[IU]/mL — ABNORMAL HIGH (ref 0.350–4.500)

## 2021-06-20 NOTE — Progress Notes (Signed)
Cardiology Office Note  Date: 06/20/2021   ID: Vanessa Anderson, Vanessa Anderson 08/27/1946, MRN [id number]  PCP:  Celene Squibb, MD  Cardiologist:  Rozann Lesches, MD Electrophysiologist:  None   Chief Complaint  Patient presents with   Cardiac follow-up     History of Present Illness: Vanessa Anderson is a 75 y.o. female last seen in March.  She is here today with her daughter for a follow-up visit.  Reports no significant palpitations or chest pain.  She did have COVID-19 back in June but has recovered.  I reviewed her medications which are noted below.  She reports compliance with therapy, no bleeding problems on Eliquis.  I did talk with her about setting up follow-up lab work on amiodarone.  Past Medical History:  Diagnosis Date   Chronic kidney disease    Essential hypertension    Fibromyalgia    Hyperlipidemia    Paroxysmal atrial fibrillation [facility])     Past Surgical History:  Procedure Laterality Date   CESAREAN SECTION     COLONOSCOPY N/A 05/28/2014   Procedure: COLONOSCOPY;  Surgeon: Danie Binder, MD;  [facility];  Service: Endoscopy;  Laterality: N/A;  8:30 AM   HYSTEROSCOPY WITH D & C N/A 11/07/2016   Procedure: DILATATION AND CURETTAGE /HYSTEROSCOPY;  Surgeon: Florian Buff, MD;  Location: [facility] ORS;  Service: Gynecology;  Laterality: N/A;    Current Outpatient Medications  Medication Sig Dispense Refill   amiodarone (PACERONE) 200 MG tablet TAKE ONE TABLET BY MOUTH EVERY MORNING 45 tablet 3   atorvastatin (LIPITOR) 10 MG tablet Take 10 mg by mouth daily at 6 PM.      Calcium Carbonate-Vitamin D (CALCIUM 500/VITAMIN D PO) Take 600 mg by mouth.     CARTIA XT 120 MG 24 hr capsule TAKE 1 CAPSULE(120 MG) BY MOUTH DAILY 90 capsule 1   cetirizine (ZYRTEC) 10 MG tablet Take 10 mg by mouth daily.     cholecalciferol (VITAMIN D3) 25 MCG (1000 UNIT) tablet Take 1,000 Units by mouth daily.     DILT-XR 120 MG 24 hr capsule Take 120 mg by mouth every morning.      ELIQUIS 5 MG TABS tablet TAKE 1 TABLET(5 MG) BY MOUTH TWICE DAILY 180 tablet 0   megestrol (MEGACE) 20 MG tablet Take 1 tablet twice a day 60 tablet 11   olmesartan-hydrochlorothiazide (BENICAR HCT) 40-25 MG tablet Take 1 tablet by mouth every morning.     No current facility-administered medications for this visit.   Allergies:  Hydrocodone   ROS: No orthopnea or PND.  Physical Exam: VS:  BP (!) 142/78   Pulse 73   Ht '5\' 2"'$  (1.575 m)   Wt 192 lb (87.1 kg)   SpO2 98%   BMI 35.12 kg/m , BMI Body mass index is 35.12 kg/m.  Wt Readings from Last 3 Encounters:  06/20/21 192 lb (87.1 kg)  01/19/21 198 lb (89.8 kg)  11/02/20 196 lb 6.4 oz (89.1 kg)    General: Patient appears comfortable at rest. HEENT: Conjunctiva and lids normal, wearing a mask. Neck: Supple, no elevated JVP or carotid bruits, no thyromegaly. Lungs: Clear to auscultation, nonlabored breathing at rest. Cardiac: Regular rate and rhythm, no S3 or significant systolic murmur. Extremities: No pitting edema.  ECG:  An ECG dated 10/25/2020 was personally reviewed today and demonstrated:  Sinus rhythm.  Recent Labwork: 10/26/2020: Hemoglobin 12.2; Platelets 239  June 2021: Potassium 3.5, BUN 22, creatinine 1.21, AST  17, ALT 19, hemoglobin 12.8, platelets 250  Other Studies Reviewed Today:  Echocardiogram 03/29/2016: Study Conclusions   - Left ventricle: The cavity size was normal. Wall thickness was   increased in a pattern of mild LVH. Systolic function was normal.   The estimated ejection fraction was in the range of 55% to 60%.   Wall motion was normal; there were no regional wall motion   abnormalities. Left ventricular diastolic function parameters   were normal for the patient&'s age. - Aortic valve: Mildly calcified annulus. Trileaflet. - Mitral valve: There was mild regurgitation. - Left atrium: The atrium was mildly dilated. - Right atrium: Central venous pressure (est): 3 mm Hg. - Tricuspid valve:  There was trivial regurgitation. - Pulmonary arteries: Systolic pressure could not be accurately   estimated. - Pericardium, extracardiac: A small pericardial effusion was   identified posterior to the heart.   Impressions:   - Mild LVH with LVEF 55-60% and grossly normal diastolic function.   Mild left atrial enlargement. Mild mitral regurgitation. Mildly   sclerotic aortic annulus. Trivial tricuspid regurgitation. Small   posterior pericardial effusion.  Assessment and Plan:  1.  Persistent atrial fibrillation with CHA2DS2-VASc score of 3.  She is doing well in terms of rhythm suppression on amiodarone, also remains on Cartia XT and Eliquis.  Check LFTs and TSH.  Keep scheduled follow-up with Dr. [name].  2.  Essential hypertension, no changes made today.  She continues on Benicar HCT as well.  Medication Adjustments/Labs and Tests Ordered: Current medicines are reviewed at length with the patient today.  Concerns regarding medicines are outlined above.   Tests Ordered: Orders Placed This Encounter  Procedures   TSH   Hepatic function panel     Medication Changes: No orders of the defined types were placed in this encounter.   Disposition:  Follow up  6 months.  Signed, Satira Sark, MD, [facility] 06/20/2021 2:49 PM    [place] at [facility] [address] [place], [place] [address] Phone: [phone]; Fax: [phone]

## 2021-06-20 NOTE — Patient Instructions (Signed)
Medication Instructions:  Your physician recommends that you continue on your current medications as directed. Please refer to the Current Medication list given to you today.  *If you need a refill on your cardiac medications before your next appointment, please call your pharmacy*   Lab Work: TSH, LFT's   If you have labs (blood work) drawn today and your tests are completely normal, you will receive your results only by: [place] (if you have MyChart) OR A paper copy in the mail If you have any lab test that is abnormal or we need to change your treatment, we will call you to review the results.   Testing/Procedures: None today    Follow-Up: At [facility], you and your health needs are our priority.  As part of our continuing mission to provide you with exceptional heart care, we have created designated Provider Care Teams.  These Care Teams include your primary Cardiologist (physician) and Advanced Practice Providers (APPs -  Physician Assistants and Nurse Practitioners) who all work together to provide you with the care you need, when you need it.  We recommend signing up for the patient portal called "MyChart".  Sign up information is provided on this After Visit Summary.  MyChart is used to connect with patients for Virtual Visits (Telemedicine).  Patients are able to view lab/test results, encounter notes, upcoming appointments, etc.  Non-urgent messages can be sent to your provider as well.   To learn more about what you can do with MyChart, go to [email or web address].    Your next appointment:   6 month(s)  The format for your next appointment:   In Person  Provider:   Rozann Lesches, MD   Other Instructions None

## 2021-06-21 ENCOUNTER — Telehealth: Payer: Self-pay | Admitting: Cardiology

## 2021-06-21 DIAGNOSIS — I480 Paroxysmal atrial fibrillation: Secondary | ICD-10-CM

## 2021-06-21 MED ORDER — AMIODARONE HCL 200 MG PO TABS: 100.0000 mg | ORAL_TABLET | Freq: Every morning | ORAL | 3 refills | Status: DC

## 2021-06-21 NOTE — Telephone Encounter (Signed)
Pt's daughter notified and verbalized understanding. PCP copied,

## 2021-06-21 NOTE — Telephone Encounter (Signed)
-----   Message from Satira Sark, MD sent at 06/20/2021  4:30 PM EDT ----- Results reviewed.  Copy to Dr. [name] as well.  TSH elevated at 14.976.  Please have her cut amiodarone to 100 mg daily.  Also check T3 and T4 levels.  Liver function tests are normal.  Plan repeat TSH in 8 weeks.

## 2021-06-21 NOTE — Telephone Encounter (Signed)
Marita [place] returned call to [place] regarding labs on her mother [phone].

## 2021-07-05 DIAGNOSIS — I10 Essential (primary) hypertension: Secondary | ICD-10-CM | POA: Diagnosis not present

## 2021-07-05 DIAGNOSIS — I129 Hypertensive chronic kidney disease with stage 1 through stage 4 chronic kidney disease, or unspecified chronic kidney disease: Secondary | ICD-10-CM | POA: Diagnosis not present

## 2021-07-05 DIAGNOSIS — E119 Type 2 diabetes mellitus without complications: Secondary | ICD-10-CM | POA: Diagnosis not present

## 2021-08-03 ENCOUNTER — Ambulatory Visit: Payer: PPO | Admitting: Cardiology

## 2021-08-04 DIAGNOSIS — E785 Hyperlipidemia, unspecified: Secondary | ICD-10-CM | POA: Diagnosis not present

## 2021-08-04 DIAGNOSIS — I129 Hypertensive chronic kidney disease with stage 1 through stage 4 chronic kidney disease, or unspecified chronic kidney disease: Secondary | ICD-10-CM | POA: Diagnosis not present

## 2021-08-04 DIAGNOSIS — I10 Essential (primary) hypertension: Secondary | ICD-10-CM | POA: Diagnosis not present

## 2021-08-07 DIAGNOSIS — Z23 Encounter for immunization: Secondary | ICD-10-CM | POA: Diagnosis not present

## 2021-08-15 DIAGNOSIS — R7303 Prediabetes: Secondary | ICD-10-CM | POA: Diagnosis not present

## 2021-08-15 DIAGNOSIS — I10 Essential (primary) hypertension: Secondary | ICD-10-CM | POA: Diagnosis not present

## 2021-08-15 DIAGNOSIS — E785 Hyperlipidemia, unspecified: Secondary | ICD-10-CM | POA: Diagnosis not present

## 2021-08-17 ENCOUNTER — Other Ambulatory Visit (HOSPITAL_COMMUNITY)
Admission: RE | Admit: 2021-08-17 | Discharge: 2021-08-17 | Disposition: A | Discharge status: Home / Self Care | Payer: PPO | Source: Ambulatory Visit | Attending: Cardiology | Admitting: Cardiology

## 2021-08-17 DIAGNOSIS — I480 Paroxysmal atrial fibrillation: Secondary | ICD-10-CM | POA: Diagnosis not present

## 2021-08-17 LAB — TSH: TSH: 14.501 u[IU]/mL — ABNORMAL HIGH (ref 0.350–4.500)

## 2021-08-23 ENCOUNTER — Other Ambulatory Visit: Payer: Self-pay | Admitting: Cardiology

## 2021-08-29 DIAGNOSIS — Z6831 Body mass index (BMI) 31.0-31.9, adult: Secondary | ICD-10-CM | POA: Diagnosis not present

## 2021-08-29 DIAGNOSIS — I480 Paroxysmal atrial fibrillation: Secondary | ICD-10-CM | POA: Diagnosis not present

## 2021-08-29 DIAGNOSIS — Z0001 Encounter for general adult medical examination with abnormal findings: Secondary | ICD-10-CM | POA: Diagnosis not present

## 2021-08-29 DIAGNOSIS — Z23 Encounter for immunization: Secondary | ICD-10-CM | POA: Diagnosis not present

## 2021-08-29 DIAGNOSIS — D72829 Elevated white blood cell count, unspecified: Secondary | ICD-10-CM | POA: Diagnosis not present

## 2021-08-29 DIAGNOSIS — N1832 Chronic kidney disease, stage 3b: Secondary | ICD-10-CM | POA: Diagnosis not present

## 2021-08-29 DIAGNOSIS — E782 Mixed hyperlipidemia: Secondary | ICD-10-CM | POA: Diagnosis not present

## 2021-08-29 DIAGNOSIS — I129 Hypertensive chronic kidney disease with stage 1 through stage 4 chronic kidney disease, or unspecified chronic kidney disease: Secondary | ICD-10-CM | POA: Diagnosis not present

## 2021-08-29 DIAGNOSIS — E669 Obesity, unspecified: Secondary | ICD-10-CM | POA: Diagnosis not present

## 2021-08-29 DIAGNOSIS — R748 Abnormal levels of other serum enzymes: Secondary | ICD-10-CM | POA: Diagnosis not present

## 2021-08-29 DIAGNOSIS — R7303 Prediabetes: Secondary | ICD-10-CM | POA: Diagnosis not present

## 2021-09-04 DIAGNOSIS — E782 Mixed hyperlipidemia: Secondary | ICD-10-CM | POA: Diagnosis not present

## 2021-09-04 DIAGNOSIS — I10 Essential (primary) hypertension: Secondary | ICD-10-CM | POA: Diagnosis not present

## 2021-09-04 DIAGNOSIS — I129 Hypertensive chronic kidney disease with stage 1 through stage 4 chronic kidney disease, or unspecified chronic kidney disease: Secondary | ICD-10-CM | POA: Diagnosis not present

## 2021-09-05 ENCOUNTER — Telehealth: Payer: Self-pay | Admitting: *Deleted

## 2021-09-05 DIAGNOSIS — E039 Hypothyroidism, unspecified: Secondary | ICD-10-CM

## 2021-09-05 NOTE — Telephone Encounter (Signed)
Spoke with daughter Drue Dun who states that Dr. [name] is not currently managing her thyroid. She is not currently being followed by an endocrinology provider. Please advise.

## 2021-09-05 NOTE — Telephone Encounter (Signed)
Pt's daughter notified of referral.

## 2021-09-13 ENCOUNTER — Ambulatory Visit: Payer: PPO | Admitting: Nurse Practitioner

## 2021-09-14 ENCOUNTER — Ambulatory Visit: Payer: PPO | Admitting: Nurse Practitioner

## 2021-09-14 ENCOUNTER — Encounter: Payer: Self-pay | Admitting: Nurse Practitioner

## 2021-09-14 ENCOUNTER — Other Ambulatory Visit: Payer: Self-pay

## 2021-09-14 VITALS — BP 137/83 | HR 74 | Ht 61.0 in | Wt 191.8 lb

## 2021-09-14 DIAGNOSIS — E039 Hypothyroidism, unspecified: Secondary | ICD-10-CM | POA: Diagnosis not present

## 2021-09-14 NOTE — Progress Notes (Signed)
Endocrinology Consult Note                                         09/14/2021, 3:21 PM  Subjective:   Subjective    Vanessa Anderson is a 75 y.o.-year-old female patient being seen in consultation for hypothyroidism referred by Celene Squibb, MD.   Past Medical History:  Diagnosis Date   Chronic kidney disease    Essential hypertension    Fibromyalgia    Hyperlipidemia    Paroxysmal atrial fibrillation [facility])     Past Surgical History:  Procedure Laterality Date   CESAREAN SECTION     COLONOSCOPY N/A 05/28/2014   Procedure: COLONOSCOPY;  Surgeon: Danie Binder, MD;  Location: [facility];  Service: Endoscopy;  Laterality: N/A;  8:30 AM   HYSTEROSCOPY WITH D & C N/A 11/07/2016   Procedure: DILATATION AND CURETTAGE /HYSTEROSCOPY;  Surgeon: Florian Buff, MD;  Location: [facility] ORS;  Service: Gynecology;  Laterality: N/A;    Social History   Socioeconomic History   Marital status: Married    Spouse name: Not on file   Number of children: 2   Years of education: Not on file   Highest education level: Not on file  Occupational History   Not on file  Tobacco Use   Smoking status: Never   Smokeless tobacco: Never  Vaping Use   Vaping Use: Never used  Substance and Sexual Activity   Alcohol use: No    Alcohol/week: 0.0 standard drinks   Drug use: No   Sexual activity: Not Currently  Other Topics Concern   Not on file  Social History Narrative   Not on file   Social Determinants of Health   Financial Resource Strain: Not on file  Food Insecurity: Not on file  Transportation Needs: Not on file  Physical Activity: Not on file  Stress: Not on file  Social Connections: Not on file    Family History  Problem Relation Age of Onset   Heart attack Father     Outpatient Encounter Medications as of 09/14/2021  Medication Sig   amiodarone (PACERONE) 200 MG tablet TAKE ONE TABLET BY MOUTH EVERY MORNING    atorvastatin (LIPITOR) 10 MG tablet Take 10 mg by mouth daily at 6 PM.    Calcium 500-2.5 MG-MCG CHEW Chew 1 tablet by mouth every morning.   cetirizine (ZYRTEC) 10 MG tablet Take 10 mg by mouth daily.   cholecalciferol (VITAMIN D3) 25 MCG (1000 UNIT) tablet Take 1,000 Units by mouth daily.   DILT-XR 120 MG 24 hr capsule Take 120 mg by mouth every morning.   ELIQUIS 5 MG TABS tablet TAKE 1 TABLET(5 MG) BY MOUTH TWICE DAILY   megestrol (MEGACE) 20 MG tablet Take 1 tablet twice a day   olmesartan-hydrochlorothiazide (BENICAR HCT) 40-25 MG tablet Take 1 tablet by mouth every morning.   Calcium Carbonate-Vitamin D (CALCIUM 500/VITAMIN D PO) Take 600 mg by mouth. (Patient not taking: Reported on 09/14/2021)   CARTIA  XT 120 MG 24 hr capsule TAKE 1 CAPSULE(120 MG) BY MOUTH DAILY (Patient not taking: Reported on 09/14/2021)   No facility-administered encounter medications on file as of 09/14/2021.    ALLERGIES: Allergies  Allergen Reactions   Hydrocodone Itching   VACCINATION STATUS: Immunization History  Administered Date(s) Administered   Influenza-Unspecified 07/06/2018     HPI   Vanessa Anderson is a patient with the above medical history. It was incidentally found during recent blood work that her thyroid function was off.  She is not currently on thyroid hormone supplement or antithyroid medications.  She denies taking Biotin supplement.  She has never had any imaging of her thyroid in the past.  In review of her medical records, she is on Amiodarone which over time can cause thyroid dysfunction.  I reviewed patient's thyroid tests:  Lab Results  Component Value Date   TSH 14.501 (H) 08/17/2021   TSH 14.976 (H) 06/20/2021   TSH 3.534 11/26/2013     Pt does not have any specific symptoms of thyroid dysfunction.  Pt denies feeling nodules in neck, hoarseness, dysphagia/odynophagia, SOB with lying down.  she denies family history of thyroid disorders.  No family history of  thyroid cancer.  No history of radiation therapy to head or neck.  No recent use of iodine supplements.  Denies use of Biotin containing supplements.    I reviewed her chart and she also has a history of Afib, CKD, HTN, HLD.   ROS:  Constitutional: no weight gain/loss, no fatigue, no subjective hyperthermia, no subjective hypothermia Eyes: no blurry vision, no xerophthalmia ENT: no sore throat, no nodules palpated in throat, no dysphagia/odynophagia, no hoarseness Cardiovascular: no chest pain, no SOB, no palpitations, no leg swelling Respiratory: no cough, no SOB Gastrointestinal: no nausea/vomiting/diarrhea Musculoskeletal: no muscle/joint aches Skin: no rashes Neurological: no tremors, no numbness, no tingling, no dizziness Psychiatric: no depression, no anxiety   Objective:   Objective     BP 137/83   Pulse 74   Ht 5\' 1"  (1.549 m)   Wt 191 lb 12.8 oz (87 kg)   BMI 36.24 kg/m  Wt Readings from Last 3 Encounters:  09/14/21 191 lb 12.8 oz (87 kg)  06/20/21 192 lb (87.1 kg)  01/19/21 198 lb (89.8 kg)    BP Readings from Last 3 Encounters:  09/14/21 137/83  06/20/21 (!) 142/78  01/19/21 (!) 148/70     Constitutional:  Body mass index is 36.24 kg/m., not in acute distress, normal state of mind Eyes: PERRLA, EOMI, no exophthalmos ENT: moist mucous membranes, no thyromegaly, no cervical lymphadenopathy Cardiovascular: normal precordial activity, RRR, no murmur/rubs/gallops Respiratory:  adequate breathing efforts, no gross chest deformity, Clear to auscultation bilaterally Gastrointestinal: abdomen soft, non-tender, no distension, bowel sounds present Musculoskeletal: no gross deformities, strength intact in all four extremities Skin: moist, warm, no rashes Neurological: no tremor with outstretched hands, deep tendon reflexes normal in BLE.   CMP ( most recent) CMP     Component Value Date/Time   NA 140 04/13/2020 1222   K 3.5 04/13/2020 1222   CL 105  04/13/2020 1222   CO2 25 04/13/2020 1222   GLUCOSE 106 (H) 04/13/2020 1222   BUN 22 04/13/2020 1222   CREATININE 1.21 (H) 04/13/2020 1222   CALCIUM 9.9 04/13/2020 1222   PROT 7.6 06/20/2021 1512   ALBUMIN 4.2 06/20/2021 1512   AST 15 06/20/2021 1512   ALT 18 06/20/2021 1512   ALKPHOS 125 06/20/2021 1512   BILITOT 0.6  06/20/2021 1512   GFRNONAA 44 (L) 04/13/2020 1222   GFRAA 51 (L) 04/13/2020 1222     Diabetic Labs (most recent): No results found for: HGBA1C   Lipid Panel ( most recent) Lipid Panel  No results found for: CHOL, TRIG, HDL, CHOLHDL, VLDL, LDLCALC, LDLDIRECT, LABVLDL     Lab Results  Component Value Date   TSH 14.501 (H) 08/17/2021   TSH 14.976 (H) 06/20/2021   TSH 3.534 11/26/2013      Assessment & Plan:   ASSESSMENT / PLAN:  1. Hypothyroidism-acquired (likely from Amiodarone use)   Patient recently had labs drawn showing abnormal TSH, repeat labs show the same.  However, more information is needed to classify her thyroid dysfunction.  Will repeat more complete thyroid function tests today, including antibody testing to rule out autoimmune thyroid dysfunction.   I will call the patients daughter (with the permission of the patient) to discuss lab results and treatment options moving forward.  We did talk about the possibility of starting thyroid hormone replacement and what it entails.    - We discussed about correct intake of levothyroxine, at fasting, with water, separated by at least 30 minutes from breakfast, and separated by more than 4 hours from calcium, iron, multivitamins, acid reflux medications (PPIs). -Patient is made aware of the fact that thyroid hormone replacement is needed for life, dose to be adjusted by periodic monitoring of thyroid function tests.   -Due to absence of clinical goiter, no need for thyroid ultrasound.  - Time spent with the patient: 45 minutes, of which >50% was spent in obtaining information about her symptoms,  reviewing her previous labs, evaluations, and treatments, counseling her about her hypothyroidism, and developing a plan to confirm the diagnosis and long term treatment as necessary. Please refer to "Patient Self Inventory" in the Media tab for reviewed elements of pertinent patient history.  Prescott Gum Yoshida participated in the discussions, expressed understanding, and voiced agreement with the above plans.  All questions were answered to her satisfaction. she is encouraged to contact clinic should she have any questions or concerns prior to her return visit.   FOLLOW UP PLAN:  Return in about 3 months (around 12/15/2021) for Thyroid follow up, Previsit labs.  Rayetta Pigg, [facility] [address] [place], [place] [address] Phone: [phone] Fax: [phone]  09/14/2021, 3:21 PM

## 2021-09-14 NOTE — Patient Instructions (Signed)

## 2021-09-15 LAB — T4, FREE: Free T4: 1.13 ng/dL (ref 0.82–1.77)

## 2021-09-15 LAB — TSH: TSH: 16.7 u[IU]/mL — ABNORMAL HIGH (ref 0.450–4.500)

## 2021-09-15 LAB — T3, FREE: T3, Free: 2.9 pg/mL (ref 2.0–4.4)

## 2021-09-15 LAB — THYROGLOBULIN ANTIBODY: Thyroglobulin Antibody: 1.2 IU/mL — ABNORMAL HIGH (ref 0.0–0.9)

## 2021-09-15 LAB — THYROID PEROXIDASE ANTIBODY: Thyroperoxidase Ab SerPl-aCnc: >600 IU/mL — ABNORMAL HIGH (ref 0–34)

## 2021-09-15 MED ORDER — LEVOTHYROXINE SODIUM 25 MCG PO TABS: 25.0000 ug | ORAL_TABLET | Freq: Every day | ORAL | 1 refills | Status: DC

## 2021-09-15 NOTE — Addendum Note (Signed)
Addended by: Brita Romp on: 09/15/2021 10:27 AM   Modules accepted: Orders

## 2021-10-04 ENCOUNTER — Other Ambulatory Visit (HOSPITAL_COMMUNITY): Payer: Self-pay | Admitting: Internal Medicine

## 2021-10-04 DIAGNOSIS — Z1231 Encounter for screening mammogram for malignant neoplasm of breast: Secondary | ICD-10-CM

## 2021-10-04 DIAGNOSIS — I10 Essential (primary) hypertension: Secondary | ICD-10-CM | POA: Diagnosis not present

## 2021-10-04 DIAGNOSIS — I129 Hypertensive chronic kidney disease with stage 1 through stage 4 chronic kidney disease, or unspecified chronic kidney disease: Secondary | ICD-10-CM | POA: Diagnosis not present

## 2021-10-04 DIAGNOSIS — E782 Mixed hyperlipidemia: Secondary | ICD-10-CM | POA: Diagnosis not present

## 2021-10-24 ENCOUNTER — Inpatient Hospital Stay (HOSPITAL_COMMUNITY): Payer: PPO

## 2021-10-24 ENCOUNTER — Inpatient Hospital Stay (HOSPITAL_COMMUNITY): Payer: PPO | Attending: Hematology

## 2021-10-24 ENCOUNTER — Other Ambulatory Visit: Payer: Self-pay

## 2021-10-24 DIAGNOSIS — Z7901 Long term (current) use of anticoagulants: Secondary | ICD-10-CM | POA: Diagnosis not present

## 2021-10-24 DIAGNOSIS — N189 Chronic kidney disease, unspecified: Secondary | ICD-10-CM | POA: Insufficient documentation

## 2021-10-24 DIAGNOSIS — Z79899 Other long term (current) drug therapy: Secondary | ICD-10-CM | POA: Diagnosis not present

## 2021-10-24 DIAGNOSIS — D72829 Elevated white blood cell count, unspecified: Secondary | ICD-10-CM | POA: Diagnosis not present

## 2021-10-24 LAB — COMPREHENSIVE METABOLIC PANEL
ALT: 14 U/L (ref 0–44)
AST: 16 U/L (ref 15–41)
Albumin: 4.2 g/dL (ref 3.5–5.0)
Alkaline Phosphatase: 110 U/L (ref 38–126)
Anion gap: 6 (ref 5–15)
BUN: 24 mg/dL — ABNORMAL HIGH (ref 8–23)
CO2: 25 mmol/L (ref 22–32)
Calcium: 9.4 mg/dL (ref 8.9–10.3)
Chloride: 110 mmol/L (ref 98–111)
Creatinine, Ser: 1.58 mg/dL — ABNORMAL HIGH (ref 0.44–1.00)
GFR, Estimated: 34 mL/min — ABNORMAL LOW (ref >60)
Glucose, Bld: 104 mg/dL — ABNORMAL HIGH (ref 70–99)
Potassium: 3.8 mmol/L (ref 3.5–5.1)
Sodium: 141 mmol/L (ref 135–145)
Total Bilirubin: 0.6 mg/dL (ref 0.3–1.2)
Total Protein: 7.4 g/dL (ref 6.5–8.1)

## 2021-10-24 LAB — CBC WITH DIFFERENTIAL/PLATELET
Abs Immature Granulocytes: 0.07 10*3/uL (ref 0.00–0.07)
Basophils Absolute: 0.1 10*3/uL (ref 0.0–0.1)
Basophils Relative: 1 %
Eosinophils Absolute: 0.1 10*3/uL (ref 0.0–0.5)
Eosinophils Relative: 1 %
HCT: 40.8 % (ref 36.0–46.0)
Hemoglobin: 12.7 g/dL (ref 12.0–15.0)
Immature Granulocytes: 1 %
Lymphocytes Relative: 18 %
Lymphs Abs: 2.2 10*3/uL (ref 0.7–4.0)
MCH: 29.5 pg (ref 26.0–34.0)
MCHC: 31.1 g/dL (ref 30.0–36.0)
MCV: 94.7 fL (ref 80.0–100.0)
Monocytes Absolute: 0.9 10*3/uL (ref 0.1–1.0)
Monocytes Relative: 7 %
Neutro Abs: 9.3 10*3/uL — ABNORMAL HIGH (ref 1.7–7.7)
Neutrophils Relative %: 72 %
Platelets: 260 10*3/uL (ref 150–400)
RBC: 4.31 MIL/uL (ref 3.87–5.11)
RDW: 14 % (ref 11.5–15.5)
WBC: 12.7 10*3/uL — ABNORMAL HIGH (ref 4.0–10.5)
nRBC: 0 % (ref 0.0–0.2)

## 2021-10-24 LAB — LACTATE DEHYDROGENASE: LDH: 133 U/L (ref 98–192)

## 2021-10-30 NOTE — Progress Notes (Signed)
[place], [place] [address]   CLINIC:  Medical Oncology/Hematology  PCP:  Celene Squibb, MD [address] [place] [address]  [phone]  REASON FOR VISIT:  Follow-up for leukocytosis  PRIOR THERAPY: none  CURRENT THERAPY: surveillance  INTERVAL HISTORY:  Vanessa Anderson, a 75 y.o. female, returns for routine follow-up for her leukocytosis. Vanessa Anderson was last seen on 11/02/2020.  Today she reports feeling good. She denies any recent fevers, night sweats, weight loss, skin rash, and infections.    REVIEW OF SYSTEMS:  Review of Systems  Constitutional:  Negative for appetite change, fatigue, fever and unexpected weight change.  Respiratory:  Positive for cough.   Cardiovascular:  Positive for palpitations.  Gastrointestinal:  Positive for constipation.  Skin:  Negative for rash.  Neurological:  Positive for headaches.  Psychiatric/Behavioral:  Positive for depression. The patient is nervous/anxious.   All other systems reviewed and are negative.  PAST MEDICAL/SURGICAL HISTORY:  Past Medical History:  Diagnosis Date   Chronic kidney disease    Essential hypertension    Fibromyalgia    Hyperlipidemia    Paroxysmal atrial fibrillation [facility])    Past Surgical History:  Procedure Laterality Date   CESAREAN SECTION     COLONOSCOPY N/A 05/28/2014   Procedure: COLONOSCOPY;  Surgeon: Danie Binder, MD;  Location[facility];  Service: Endoscopy;  Laterality: N/A;  8:30 AM   HYSTEROSCOPY WITH D & C N/A 11/07/2016   Procedure: DILATATION AND CURETTAGE /HYSTEROSCOPY;  Surgeon: Florian Buff, MD;  Location: [facility];  Service: Gynecology;  Laterality: N/A;    SOCIAL HISTORY:  Social History   Socioeconomic History   Marital status: Married    Spouse name: Not on file   Number of children: 2   Years of education: Not on file   Highest education level: Not on file  Occupational History   Not on file  Tobacco Use   Smoking  status: Never   Smokeless tobacco: Never  Vaping Use   Vaping Use: Never used  Substance and Sexual Activity   Alcohol use: No    Alcohol/week: 0.0 standard drinks   Drug use: No   Sexual activity: Not Currently  Other Topics Concern   Not on file  Social History Narrative   Not on file   Social Determinants of Health   Financial Resource Strain: Not on file  Food Insecurity: Not on file  Transportation Needs: Not on file  Physical Activity: Not on file  Stress: Not on file  Social Connections: Not on file  Intimate Partner Violence: Not on file    FAMILY HISTORY:  Family History  Problem Relation Age of Onset   Heart attack Father     CURRENT MEDICATIONS:  Current Outpatient Medications  Medication Sig Dispense Refill   amiodarone (PACERONE) 200 MG tablet TAKE ONE TABLET BY MOUTH EVERY MORNING 90 tablet 3   atorvastatin (LIPITOR) 10 MG tablet Take 10 mg by mouth daily at 6 PM.      Calcium 500-2.5 MG-MCG CHEW Chew 1 tablet by mouth every morning.     Calcium Carbonate-Vitamin D (CALCIUM 500/VITAMIN D PO) Take 600 mg by mouth.     CARTIA XT 120 MG 24 hr capsule TAKE 1 CAPSULE(120 MG) BY MOUTH DAILY 90 capsule 1   cetirizine (ZYRTEC) 10 MG tablet Take 10 mg by mouth daily.     cholecalciferol (VITAMIN D3) 25 MCG (1000 UNIT)  tablet Take 1,000 Units by mouth daily.     DILT-XR 120 MG 24 hr capsule Take 120 mg by mouth every morning.     ELIQUIS 5 MG TABS tablet TAKE 1 TABLET(5 MG) BY MOUTH TWICE DAILY 180 tablet 0   levothyroxine (SYNTHROID) 25 MCG tablet Take 1 tablet (25 mcg total) by mouth daily. 90 tablet 1   megestrol (MEGACE) 20 MG tablet Take 1 tablet twice a day 60 tablet 11   olmesartan-hydrochlorothiazide (BENICAR HCT) 40-25 MG tablet Take 1 tablet by mouth every morning.     No current facility-administered medications for this visit.    ALLERGIES:  Allergies  Allergen Reactions   Hydrocodone Itching    PHYSICAL EXAM:  Performance status (ECOG): 1 -  Symptomatic but completely ambulatory  Vitals:   10/31/21 1417  BP: (!) 155/81  Pulse: 79  Resp: 20  Temp: 98.3 F (36.8 C)  SpO2: 96%   Wt Readings from Last 3 Encounters:  10/31/21 191 lb 2.2 oz (86.7 kg)  09/14/21 191 lb 12.8 oz (87 kg)  06/20/21 192 lb (87.1 kg)   Physical Exam Vitals reviewed.  Constitutional:      Appearance: Normal appearance.  Cardiovascular:     Rate and Rhythm: Normal rate and regular rhythm.     Pulses: Normal pulses.     Heart sounds: Normal heart sounds.  Pulmonary:     Effort: Pulmonary effort is normal.     Breath sounds: Normal breath sounds.  Lymphadenopathy:     Cervical: No cervical adenopathy.     Right cervical: No superficial cervical adenopathy.    Left cervical: No superficial cervical adenopathy.     Upper Body:     Right upper body: No supraclavicular, axillary or pectoral adenopathy.     Left upper body: No supraclavicular, axillary or pectoral adenopathy.  Neurological:     General: No focal deficit present.     Mental Status: She is alert and oriented to person, place, and time.  Psychiatric:        Mood and Affect: Mood normal.        Behavior: Behavior normal.    LABORATORY DATA:  I have reviewed the labs as listed.  CBC Latest Ref Rng & Units 10/24/2021 10/26/2020 04/13/2020  WBC 4.0 - 10.5 K/uL 12.7(H) 11.0(H) 12.0(H)  Hemoglobin 12.0 - 15.0 g/dL 12.7 12.2 12.8  Hematocrit 36.0 - 46.0 % 40.8 38.8 40.7  Platelets 150 - 400 K/uL 260 239 250   CMP Latest Ref Rng & Units 10/24/2021 06/20/2021 04/13/2020  Glucose 70 - 99 mg/dL 104(H) - 106(H)  BUN 8 - 23 mg/dL 24(H) - 22  Creatinine 0.44 - 1.00 mg/dL 1.58(H) - 1.21(H)  Sodium 135 - 145 mmol/L 141 - 140  Potassium 3.5 - 5.1 mmol/L 3.8 - 3.5  Chloride 98 - 111 mmol/L 110 - 105  CO2 22 - 32 mmol/L 25 - 25  Calcium 8.9 - 10.3 mg/dL 9.4 - 9.9  Total Protein 6.5 - 8.1 g/dL 7.4 7.6 8.1  Total Bilirubin 0.3 - 1.2 mg/dL 0.6 0.6 0.7  Alkaline Phos 38 - 126 U/L 110 125 113   AST 15 - 41 U/L _0 ALT 0 - 44 U/L _1 Component Value Date/Time   RBC 4.31 10/24/2021 1510   MCV 94.7 10/24/2021 1510   MCH 29.5 10/24/2021 1510   MCHC 31.1 10/24/2021 1510   RDW 14.0 10/24/2021 1510   LYMPHSABS 2.2  10/24/2021 1510   MONOABS 0.9 10/24/2021 1510   EOSABS 0.1 10/24/2021 1510   BASOSABS 0.1 10/24/2021 1510    DIAGNOSTIC IMAGING:  I have independently reviewed the scans and discussed with the patient. No results found.   ASSESSMENT:  1.  JAK2 V617F and BCR/ABL negative leukocytosis: -We reviewed labs from 04/13/2020.  White count is 12 with normal hemoglobin and platelet count.  Differential showed 69% neutrophils 21% lymphocytes and 8% monocytes. -She does not have any B symptoms or infections. -She will follow up with [place] in 6 months with repeat labs.  If there is any significant worsening, will consider bone marrow biopsy.   2.  CKD: -She has mild CKD and her creatinine has been stable around 1.21.   PLAN:  1.  JAK2 V617F and BCR/ABL negative leukocytosis: - She does not have any B symptoms.  No recurrent infections or systemic steroid use.  No palpable adenopathy or splenomegaly. - Reviewed labs from 10/24/2021.  White count is stable around 12.7 with differential predominantly neutrophils.  Hemoglobin and platelet count was normal.  LFTs were normal. - Chronic CKD is also stable with creatinine around 1.58. - Recommend follow-up in 1 year with repeat labs.  Orders placed this encounter:  No orders of the defined types were placed in this encounter.    Derek Jack, MD [place] [phone]   I, Thana Ates, am acting as a scribe for Dr. Derek Jack.  I, Derek Jack MD, have reviewed the above documentation for accuracy and completeness, and I agree with the above.

## 2021-10-31 ENCOUNTER — Inpatient Hospital Stay (HOSPITAL_BASED_OUTPATIENT_CLINIC_OR_DEPARTMENT_OTHER): Payer: PPO | Admitting: Hematology

## 2021-10-31 ENCOUNTER — Other Ambulatory Visit (HOSPITAL_COMMUNITY): Payer: Self-pay

## 2021-10-31 ENCOUNTER — Other Ambulatory Visit: Payer: Self-pay

## 2021-10-31 VITALS — BP 155/81 | HR 79 | Temp 98.3°F | Resp 20 | Ht 62.99 in | Wt 191.1 lb

## 2021-10-31 DIAGNOSIS — D72829 Elevated white blood cell count, unspecified: Secondary | ICD-10-CM

## 2021-10-31 NOTE — Patient Instructions (Signed)
[place] at [facility] Discharge Instructions  You were seen and examined today by Dr. Delton Coombes. He reviewed your most recent labs and everything looks good. Your kidney function was slightly elevated. He wants you to drink more water to help with this. Please keep follow up as scheduled in 1 year.   Thank you for choosing [place] at [facility] to provide your oncology and hematology care.  To afford each patient quality time with our provider, please arrive at least 15 minutes before your scheduled appointment time.   If you have a lab appointment with the [facility] please come in thru the Main Entrance and check in at the main information desk.  You need to re-schedule your appointment should you arrive 10 or more minutes late.  We strive to give you quality time with our providers, and arriving late affects you and other patients whose appointments are after yours.  Also, if you no show three or more times for appointments you may be dismissed from the clinic at the providers discretion.     Again, thank you for choosing [facility].  Our hope is that these requests will decrease the amount of time that you wait before being seen by our physicians.       _____________________________________________________________  Should you have questions after your visit to [facility], please contact our office at [phone] and follow the prompts.  Our office hours are 8:00 a.m. and 4:30 p.m. Monday - Friday.  Please note that voicemails left after 4:00 p.m. may not be returned until the following business day.  We are closed weekends and major holidays.  You do have access to a nurse 24-7, just call the main number to the clinic [phone] and do not press any options, hold on the line and a nurse will answer the phone.    For prescription refill requests, have your pharmacy contact our office and allow 72 hours.     Due to Covid, you will need to wear a mask upon entering the hospital. If you do not have a mask, a mask will be given to you at the Main Entrance upon arrival. For doctor visits, patients may have 1 support person age 80 or older with them. For treatment visits, patients can not have anyone with them due to social distancing guidelines and our immunocompromised population.

## 2021-11-02 ENCOUNTER — Other Ambulatory Visit: Payer: Self-pay

## 2021-11-02 ENCOUNTER — Ambulatory Visit (HOSPITAL_COMMUNITY)
Admission: RE | Admit: 2021-11-02 | Discharge: 2021-11-02 | Disposition: A | Discharge status: Home / Self Care | Payer: PPO | Source: Ambulatory Visit | Attending: Internal Medicine | Admitting: Internal Medicine

## 2021-11-02 DIAGNOSIS — Z1231 Encounter for screening mammogram for malignant neoplasm of breast: Secondary | ICD-10-CM | POA: Diagnosis not present

## 2021-11-03 DIAGNOSIS — I10 Essential (primary) hypertension: Secondary | ICD-10-CM | POA: Diagnosis not present

## 2021-11-03 DIAGNOSIS — E782 Mixed hyperlipidemia: Secondary | ICD-10-CM | POA: Diagnosis not present

## 2021-11-03 DIAGNOSIS — I129 Hypertensive chronic kidney disease with stage 1 through stage 4 chronic kidney disease, or unspecified chronic kidney disease: Secondary | ICD-10-CM | POA: Diagnosis not present

## 2021-12-04 DIAGNOSIS — R062 Wheezing: Secondary | ICD-10-CM | POA: Diagnosis not present

## 2021-12-04 DIAGNOSIS — R059 Cough, unspecified: Secondary | ICD-10-CM | POA: Diagnosis not present

## 2021-12-04 DIAGNOSIS — J029 Acute pharyngitis, unspecified: Secondary | ICD-10-CM | POA: Diagnosis not present

## 2021-12-04 DIAGNOSIS — R000 Tachycardia, unspecified: Secondary | ICD-10-CM | POA: Diagnosis not present

## 2021-12-04 DIAGNOSIS — J069 Acute upper respiratory infection, unspecified: Secondary | ICD-10-CM | POA: Diagnosis not present

## 2021-12-05 DIAGNOSIS — I10 Essential (primary) hypertension: Secondary | ICD-10-CM | POA: Diagnosis not present

## 2021-12-05 DIAGNOSIS — E785 Hyperlipidemia, unspecified: Secondary | ICD-10-CM | POA: Diagnosis not present

## 2021-12-05 DIAGNOSIS — I129 Hypertensive chronic kidney disease with stage 1 through stage 4 chronic kidney disease, or unspecified chronic kidney disease: Secondary | ICD-10-CM | POA: Diagnosis not present

## 2021-12-12 DIAGNOSIS — E039 Hypothyroidism, unspecified: Secondary | ICD-10-CM | POA: Diagnosis not present

## 2021-12-13 LAB — T4, FREE: Free T4: 1.32 ng/dL (ref 0.82–1.77)

## 2021-12-13 LAB — TSH: TSH: 10.6 u[IU]/mL — ABNORMAL HIGH (ref 0.450–4.500)

## 2021-12-18 ENCOUNTER — Ambulatory Visit: Payer: PPO | Admitting: Nurse Practitioner

## 2021-12-18 ENCOUNTER — Encounter: Payer: Self-pay | Admitting: Nurse Practitioner

## 2021-12-18 ENCOUNTER — Other Ambulatory Visit: Payer: Self-pay

## 2021-12-18 VITALS — BP 142/75 | HR 68 | Ht 61.0 in | Wt 188.4 lb

## 2021-12-18 DIAGNOSIS — E038 Other specified hypothyroidism: Secondary | ICD-10-CM

## 2021-12-18 DIAGNOSIS — E063 Autoimmune thyroiditis: Secondary | ICD-10-CM | POA: Diagnosis not present

## 2021-12-18 MED ORDER — LEVOTHYROXINE SODIUM 25 MCG PO TABS: 25.0000 ug | ORAL_TABLET | Freq: Every day | ORAL | 1 refills | Status: DC

## 2021-12-18 NOTE — Patient Instructions (Signed)

## 2021-12-18 NOTE — Progress Notes (Signed)
Endocrinology Follow Up Note                                         12/18/2021, 2:42 PM  Subjective:   Subjective    Vanessa Anderson is a 76 y.o.-year-old female patient being seen in follow up after being seen in consultation for hypothyroidism referred by Celene Squibb, MD.   Past Medical History:  Diagnosis Date   Chronic kidney disease    Essential hypertension    Fibromyalgia    Hyperlipidemia    Paroxysmal atrial fibrillation [facility])     Past Surgical History:  Procedure Laterality Date   CESAREAN SECTION     COLONOSCOPY N/A 05/28/2014   Procedure: COLONOSCOPY;  Surgeon: Danie Binder, MD;  Location: [facility];  Service: Endoscopy;  Laterality: N/A;  8:30 AM   HYSTEROSCOPY WITH D & C N/A 11/07/2016   Procedure: DILATATION AND CURETTAGE /HYSTEROSCOPY;  Surgeon: Florian Buff, MD;  Location: [facility] ORS;  Service: Gynecology;  Laterality: N/A;    Social History   Socioeconomic History   Marital status: Married    Spouse name: Not on file   Number of children: 2   Years of education: Not on file   Highest education level: Not on file  Occupational History   Not on file  Tobacco Use   Smoking status: Never   Smokeless tobacco: Never  Vaping Use   Vaping Use: Never used  Substance and Sexual Activity   Alcohol use: No    Alcohol/week: 0.0 standard drinks   Drug use: No   Sexual activity: Not Currently  Other Topics Concern   Not on file  Social History Narrative   Not on file   Social Determinants of Health   Financial Resource Strain: Not on file  Food Insecurity: Not on file  Transportation Needs: Not on file  Physical Activity: Not on file  Stress: Not on file  Social Connections: Not on file    Family History  Problem Relation Age of Onset   Heart attack Father     Outpatient Encounter Medications as of 12/18/2021  Medication Sig   amiodarone (PACERONE) 200 MG tablet TAKE ONE  TABLET BY MOUTH EVERY MORNING   atorvastatin (LIPITOR) 10 MG tablet Take 10 mg by mouth daily at 6 PM.    benzonatate (TESSALON) 200 MG capsule Take 200 mg by mouth 2 (two) times daily as needed.   Calcium 500-2.5 MG-MCG CHEW Chew 1 tablet by mouth every morning.   Calcium Carbonate-Vitamin D (CALCIUM 500/VITAMIN D PO) Take 600 mg by mouth.   CARTIA XT 120 MG 24 hr capsule TAKE 1 CAPSULE(120 MG) BY MOUTH DAILY   cetirizine (ZYRTEC) 10 MG tablet Take 10 mg by mouth daily.   cholecalciferol (VITAMIN D3) 25 MCG (1000 UNIT) tablet Take 1,000 Units by mouth daily.   DILT-XR 120 MG 24 hr capsule Take 120 mg by mouth every morning.   ELIQUIS 5 MG TABS tablet TAKE 1 TABLET(5 MG)  BY MOUTH TWICE DAILY   megestrol (MEGACE) 20 MG tablet Take 1 tablet twice a day   olmesartan-hydrochlorothiazide (BENICAR HCT) 40-25 MG tablet Take 1 tablet by mouth every morning.   [DISCONTINUED] levothyroxine (SYNTHROID) 25 MCG tablet Take 1 tablet (25 mcg total) by mouth daily.   levothyroxine (SYNTHROID) 25 MCG tablet Take 1 tablet (25 mcg total) by mouth daily before breakfast.   [DISCONTINUED] levothyroxine (SYNTHROID) 25 MCG tablet Take 1 tablet (25 mcg total) by mouth daily.   No facility-administered encounter medications on file as of 12/18/2021.    ALLERGIES: Allergies  Allergen Reactions   Hydrocodone Itching   VACCINATION STATUS: Immunization History  Administered Date(s) Administered   Influenza-Unspecified 07/06/2018     HPI   SHAWNNA Anderson is a patient with the above medical history. It was incidentally found during recent blood work that her thyroid function was off.  She denies taking Biotin supplement.  She has never had any imaging of her thyroid in the past.  In review of her medical records, she is on Amiodarone which over time can cause thyroid dysfunction.  She has positive thyroid antibodies meaning she has autoimmune thyroid dysfunction.  I reviewed patient's thyroid tests:  Lab  Results  Component Value Date   TSH 10.600 (H) 12/12/2021   TSH 16.700 (H) 09/14/2021   TSH 14.501 (H) 08/17/2021   TSH 14.976 (H) 06/20/2021   TSH 3.534 11/26/2013   FREET4 1.32 12/12/2021   FREET4 1.13 09/14/2021     Pt does not have any specific symptoms of thyroid dysfunction.  Pt denies feeling nodules in neck, hoarseness, dysphagia/odynophagia, SOB with lying down.  she denies family history of thyroid disorders.  No family history of thyroid cancer.  No history of radiation therapy to head or neck.  No recent use of iodine supplements.  Denies use of Biotin containing supplements.    I reviewed her chart and she also has a history of Afib, CKD, HTN, HLD.   Review of systems  Constitutional: + Minimally fluctuating body weight,  current Body mass index is 35.6 kg/m. , no fatigue, no subjective hyperthermia, no subjective hypothermia Eyes: no blurry vision, no xerophthalmia ENT: no sore throat, no nodules palpated in throat, no dysphagia/odynophagia, no hoarseness Cardiovascular: no chest pain, no shortness of breath, no palpitations, no leg swelling Respiratory: no cough, no shortness of breath Gastrointestinal: no nausea/vomiting/diarrhea Musculoskeletal: no muscle/joint aches Skin: no rashes, no hyperemia Neurological: no tremors, no numbness, no tingling, no dizziness Psychiatric: no depression, no anxiety   Objective:   Objective     BP (!) 142/75    Pulse 68    Ht 5\' 1"  (1.549 m)    Wt 188 lb 6.4 oz (85.5 kg)    SpO2 98%    BMI 35.60 kg/m  Wt Readings from Last 3 Encounters:  12/18/21 188 lb 6.4 oz (85.5 kg)  10/31/21 191 lb 2.2 oz (86.7 kg)  09/14/21 191 lb 12.8 oz (87 kg)    BP Readings from Last 3 Encounters:  12/18/21 (!) 142/75  10/31/21 (!) 155/81  09/14/21 137/83      Physical Exam- Limited  Constitutional:  Body mass index is 35.6 kg/m. , not in acute distress, normal state of mind Eyes:  EOMI, no exophthalmos Neck:  Supple Cardiovascular: RRR, no murmurs, rubs, or gallops, no edema Respiratory: Adequate breathing efforts, no crackles, rales, rhonchi, or wheezing Musculoskeletal: no gross deformities, strength intact in all four extremities, no gross restriction of joint movements  Skin:  no rashes, no hyperemia Neurological: no tremor with outstretched hands   CMP ( most recent) CMP     Component Value Date/Time   NA 141 10/24/2021 1510   K 3.8 10/24/2021 1510   CL 110 10/24/2021 1510   CO2 25 10/24/2021 1510   GLUCOSE 104 (H) 10/24/2021 1510   BUN 24 (H) 10/24/2021 1510   CREATININE 1.58 (H) 10/24/2021 1510   CALCIUM 9.4 10/24/2021 1510   PROT 7.4 10/24/2021 1510   ALBUMIN 4.2 10/24/2021 1510   AST 16 10/24/2021 1510   ALT 14 10/24/2021 1510   ALKPHOS 110 10/24/2021 1510   BILITOT 0.6 10/24/2021 1510   GFRNONAA 34 (L) 10/24/2021 1510   GFRAA 51 (L) 04/13/2020 1222     Diabetic Labs (most recent): No results found for: HGBA1C   Lipid Panel ( most recent) Lipid Panel  No results found for: CHOL, TRIG, HDL, CHOLHDL, VLDL, LDLCALC, LDLDIRECT, LABVLDL     Lab Results  Component Value Date   TSH 10.600 (H) 12/12/2021   TSH 16.700 (H) 09/14/2021   TSH 14.501 (H) 08/17/2021   TSH 14.976 (H) 06/20/2021   TSH 3.534 11/26/2013   FREET4 1.32 12/12/2021   FREET4 1.13 09/14/2021      Assessment & Plan:   ASSESSMENT / PLAN:  1. Hypothyroidism-r/t Hashimotos thyroiditis  Her repeat thyroid function tests show improvement, not yet at goal.  After further questioning, it appears her pharmacy has been sending her medication in bubble packs with all the medications together which is likely contributing to inconsistent absorption of the thyroid hormone.  - We discussed about correct intake of levothyroxine, at fasting, with water, separated by at least 30 minutes from breakfast, and separated by more than 4 hours from calcium, iron, multivitamins, acid reflux medications (PPIs). -Patient  is made aware of the fact that thyroid hormone replacement is needed for life, dose to be adjusted by periodic monitoring of thyroid function tests.     I spent 30 minutes in the care of the patient today including review of labs from Thyroid Function, CMP, and other relevant labs ; imaging/biopsy records (current and previous including abstractions from other facilities); face-to-face time discussing  her lab results and symptoms, medications doses, her options of short and long term treatment based on the latest standards of care / guidelines;   and documenting the encounter.  Prescott Gum Resnick  participated in the discussions, expressed understanding, and voiced agreement with the above plans.  All questions were answered to her satisfaction. she is encouraged to contact clinic should she have any questions or concerns prior to her return visit.   FOLLOW UP PLAN:  Return in about 4 months (around 04/17/2022) for Thyroid follow up, Previsit labs.  Rayetta Pigg, [facility] [address] [place], [place] [address] Phone: [phone] Fax: [phone]  12/18/2021, 2:42 PM

## 2021-12-29 DIAGNOSIS — R7303 Prediabetes: Secondary | ICD-10-CM | POA: Diagnosis not present

## 2021-12-29 DIAGNOSIS — I10 Essential (primary) hypertension: Secondary | ICD-10-CM | POA: Diagnosis not present

## 2022-01-02 DIAGNOSIS — I10 Essential (primary) hypertension: Secondary | ICD-10-CM | POA: Diagnosis not present

## 2022-01-02 DIAGNOSIS — Z6831 Body mass index (BMI) 31.0-31.9, adult: Secondary | ICD-10-CM | POA: Diagnosis not present

## 2022-01-02 DIAGNOSIS — R7303 Prediabetes: Secondary | ICD-10-CM | POA: Diagnosis not present

## 2022-01-02 DIAGNOSIS — D72829 Elevated white blood cell count, unspecified: Secondary | ICD-10-CM | POA: Diagnosis not present

## 2022-01-02 DIAGNOSIS — E669 Obesity, unspecified: Secondary | ICD-10-CM | POA: Diagnosis not present

## 2022-01-02 DIAGNOSIS — I480 Paroxysmal atrial fibrillation: Secondary | ICD-10-CM | POA: Diagnosis not present

## 2022-01-02 DIAGNOSIS — R748 Abnormal levels of other serum enzymes: Secondary | ICD-10-CM | POA: Diagnosis not present

## 2022-01-02 DIAGNOSIS — E119 Type 2 diabetes mellitus without complications: Secondary | ICD-10-CM | POA: Diagnosis not present

## 2022-01-02 DIAGNOSIS — E782 Mixed hyperlipidemia: Secondary | ICD-10-CM | POA: Diagnosis not present

## 2022-01-02 DIAGNOSIS — N1832 Chronic kidney disease, stage 3b: Secondary | ICD-10-CM | POA: Diagnosis not present

## 2022-01-02 DIAGNOSIS — I129 Hypertensive chronic kidney disease with stage 1 through stage 4 chronic kidney disease, or unspecified chronic kidney disease: Secondary | ICD-10-CM | POA: Diagnosis not present

## 2022-01-03 HISTORY — PX: CATARACT EXTRACTION: SUR2

## 2022-01-15 DIAGNOSIS — N1832 Chronic kidney disease, stage 3b: Secondary | ICD-10-CM | POA: Diagnosis not present

## 2022-01-15 DIAGNOSIS — I129 Hypertensive chronic kidney disease with stage 1 through stage 4 chronic kidney disease, or unspecified chronic kidney disease: Secondary | ICD-10-CM | POA: Diagnosis not present

## 2022-01-18 ENCOUNTER — Other Ambulatory Visit (HOSPITAL_COMMUNITY): Payer: Self-pay | Admitting: Nephrology

## 2022-01-22 ENCOUNTER — Telehealth: Payer: Self-pay

## 2022-01-22 DIAGNOSIS — H401124 Primary open-angle glaucoma, left eye, indeterminate stage: Secondary | ICD-10-CM | POA: Diagnosis not present

## 2022-01-22 DIAGNOSIS — Z01818 Encounter for other preprocedural examination: Secondary | ICD-10-CM | POA: Diagnosis not present

## 2022-01-22 DIAGNOSIS — H25812 Combined forms of age-related cataract, left eye: Secondary | ICD-10-CM | POA: Diagnosis not present

## 2022-01-22 NOTE — Telephone Encounter (Signed)
? ?  Pre-operative Risk Assessment  ?  ?Patient Name: Vanessa Anderson  ?DOB: 1946-02-20 ?MRN: [id number]  ? ?  ? ?Request for Surgical Clearance   ? ?Procedure:   cataract extractio by PE, IOL-Left ? ?Date of Surgery:  Clearance 02/05/22                              ?   ?Surgeon:  Dr. Tama High ?Surgeon's Group or Practice Name:  [facility] ?Phone number:  [phone] [id number] ?Fax number:  [phone] ?  ?Type of Clearance Requested:   ?- Medical  ?- Pharmacy:  Hold Apixaban (Eliquis) x2 days ?  ?Type of Anesthesia:   IV Sedation ?  ?Additional requests/questions:     ? ?Signed, ?Lataria Courser   ?01/22/2022, 5:10 PM  ? ?

## 2022-01-22 NOTE — Telephone Encounter (Signed)
Patient with diagnosis of afib on Eliquis for anticoagulation.   ? ?Procedure: cataract extractio by PE, IOL-Left ?Date of procedure: 02/05/22 ? ?CHA2DS2-VASc Score = 4  ? This indicates a 4.8% annual risk of stroke. ?The patient's score is based upon: ?CHF History: 0 ?HTN History: 1 ?Diabetes History: 0 ?Stroke History: 0 ?Vascular Disease History: 0 ?Age Score: 2 ?Gender Score: 1 ?  ?  ? ?CrCl 30 ml/min ? ?There is typically no reason to hold anticoagulation for cataract surgery. If surgeon insists that anticoagulation is held, patient may hold Eliquis for 1 day prior.  ? ?

## 2022-01-23 NOTE — Telephone Encounter (Signed)
? ?  Patient Name: Vanessa Anderson  ?DOB: Jun 22, 1946 ?MRN: [id number] ? ?Primary Cardiologist: Rozann Lesches, MD ? ?Chart reviewed as part of pre-operative protocol coverage. Cataract extractions are recognized in guidelines as low risk surgeries that do not typically require specific preoperative testing or holding of blood thinner therapy. Therefore, given past medical history and time since last visit, based on ACC/AHA guidelines, Vanessa Anderson would be at acceptable risk for the planned procedure without further cardiovascular testing.  ? ?Per our clinical pharmacist: ?Patient with diagnosis of afib on Eliquis for anticoagulation.   ?  ?Procedure: cataract extractio by PE, IOL-Left ?Date of procedure: 02/05/22 ?  ?CHA2DS2-VASc Score = 4  ? This indicates a 4.8% annual risk of stroke. ?The patient's score is based upon: ?CHF History: 0 ?HTN History: 1 ?Diabetes History: 0 ?Stroke History: 0 ?Vascular Disease History: 0 ?Age Score: 2 ?Gender Score: 1 ?  ?CrCl 30 ml/min ?  ?There is typically no reason to hold anticoagulation for cataract surgery. If surgeon insists that anticoagulation is held, patient may hold Eliquis for 1 day prior. ? ?I will route this recommendation to the requesting party via Epic fax function and remove from pre-op pool. ? ?Please call with questions. ? ?Ledora Bottcher, PA ?01/23/2022, 6:48 AM ? ?

## 2022-01-29 ENCOUNTER — Other Ambulatory Visit: Payer: Self-pay

## 2022-01-29 ENCOUNTER — Ambulatory Visit (HOSPITAL_COMMUNITY)
Admission: RE | Admit: 2022-01-29 | Discharge: 2022-01-29 | Disposition: A | Discharge status: Home / Self Care | Payer: PPO | Source: Ambulatory Visit | Attending: Nephrology | Admitting: Nephrology

## 2022-01-29 DIAGNOSIS — N1832 Chronic kidney disease, stage 3b: Secondary | ICD-10-CM | POA: Diagnosis not present

## 2022-02-02 DIAGNOSIS — I10 Essential (primary) hypertension: Secondary | ICD-10-CM | POA: Diagnosis not present

## 2022-02-02 DIAGNOSIS — I129 Hypertensive chronic kidney disease with stage 1 through stage 4 chronic kidney disease, or unspecified chronic kidney disease: Secondary | ICD-10-CM | POA: Diagnosis not present

## 2022-02-05 DIAGNOSIS — H401124 Primary open-angle glaucoma, left eye, indeterminate stage: Secondary | ICD-10-CM | POA: Diagnosis not present

## 2022-02-05 DIAGNOSIS — H25812 Combined forms of age-related cataract, left eye: Secondary | ICD-10-CM | POA: Diagnosis not present

## 2022-02-20 ENCOUNTER — Other Ambulatory Visit: Payer: Self-pay | Admitting: Nurse Practitioner

## 2022-03-26 DIAGNOSIS — H40051 Ocular hypertension, right eye: Secondary | ICD-10-CM | POA: Diagnosis not present

## 2022-03-26 DIAGNOSIS — M25811 Other specified joint disorders, right shoulder: Secondary | ICD-10-CM | POA: Diagnosis not present

## 2022-03-26 DIAGNOSIS — H25811 Combined forms of age-related cataract, right eye: Secondary | ICD-10-CM | POA: Diagnosis not present

## 2022-04-05 DIAGNOSIS — N1832 Chronic kidney disease, stage 3b: Secondary | ICD-10-CM | POA: Diagnosis not present

## 2022-04-05 DIAGNOSIS — E782 Mixed hyperlipidemia: Secondary | ICD-10-CM | POA: Diagnosis not present

## 2022-04-05 DIAGNOSIS — R7301 Impaired fasting glucose: Secondary | ICD-10-CM | POA: Diagnosis not present

## 2022-04-11 DIAGNOSIS — N1832 Chronic kidney disease, stage 3b: Secondary | ICD-10-CM | POA: Diagnosis not present

## 2022-04-11 DIAGNOSIS — I129 Hypertensive chronic kidney disease with stage 1 through stage 4 chronic kidney disease, or unspecified chronic kidney disease: Secondary | ICD-10-CM | POA: Diagnosis not present

## 2022-04-11 DIAGNOSIS — E669 Obesity, unspecified: Secondary | ICD-10-CM | POA: Diagnosis not present

## 2022-04-11 DIAGNOSIS — I480 Paroxysmal atrial fibrillation: Secondary | ICD-10-CM | POA: Diagnosis not present

## 2022-04-11 DIAGNOSIS — E782 Mixed hyperlipidemia: Secondary | ICD-10-CM | POA: Diagnosis not present

## 2022-04-11 DIAGNOSIS — R748 Abnormal levels of other serum enzymes: Secondary | ICD-10-CM | POA: Diagnosis not present

## 2022-04-11 DIAGNOSIS — D72829 Elevated white blood cell count, unspecified: Secondary | ICD-10-CM | POA: Diagnosis not present

## 2022-04-11 DIAGNOSIS — R809 Proteinuria, unspecified: Secondary | ICD-10-CM | POA: Diagnosis not present

## 2022-04-11 DIAGNOSIS — H269 Unspecified cataract: Secondary | ICD-10-CM | POA: Diagnosis not present

## 2022-04-11 DIAGNOSIS — Z6834 Body mass index (BMI) 34.0-34.9, adult: Secondary | ICD-10-CM | POA: Diagnosis not present

## 2022-04-11 DIAGNOSIS — R7303 Prediabetes: Secondary | ICD-10-CM | POA: Diagnosis not present

## 2022-04-17 ENCOUNTER — Ambulatory Visit: Payer: PPO | Admitting: Nurse Practitioner

## 2022-04-30 DIAGNOSIS — E038 Other specified hypothyroidism: Secondary | ICD-10-CM | POA: Diagnosis not present

## 2022-04-30 DIAGNOSIS — E063 Autoimmune thyroiditis: Secondary | ICD-10-CM | POA: Diagnosis not present

## 2022-05-01 LAB — TSH: TSH: 6 u[IU]/mL — ABNORMAL HIGH (ref 0.450–4.500)

## 2022-05-01 LAB — T4, FREE: Free T4: 1.6 ng/dL (ref 0.82–1.77)

## 2022-05-03 ENCOUNTER — Ambulatory Visit: Payer: PPO | Admitting: Nurse Practitioner

## 2022-05-03 ENCOUNTER — Encounter: Payer: Self-pay | Admitting: Nurse Practitioner

## 2022-05-03 VITALS — BP 134/80 | HR 72 | Ht 61.0 in | Wt 185.0 lb

## 2022-05-03 DIAGNOSIS — E063 Autoimmune thyroiditis: Secondary | ICD-10-CM

## 2022-05-03 DIAGNOSIS — E038 Other specified hypothyroidism: Secondary | ICD-10-CM | POA: Diagnosis not present

## 2022-05-03 MED ORDER — LEVOTHYROXINE SODIUM 25 MCG PO TABS: 25.0000 ug | ORAL_TABLET | Freq: Every day | ORAL | 3 refills | Status: DC

## 2022-05-03 NOTE — Patient Instructions (Signed)

## 2022-05-03 NOTE — Progress Notes (Signed)
Endocrinology Follow Up Note                                         05/03/2022, 11:19 AM  Subjective:   Subjective    Vanessa Anderson is a 76 y.o.-year-old female patient being seen in follow up after being seen in consultation for hypothyroidism referred by Celene Squibb, MD.   Past Medical History:  Diagnosis Date   Chronic kidney disease    Essential hypertension    Fibromyalgia    Hyperlipidemia    Paroxysmal atrial fibrillation [facility])     Past Surgical History:  Procedure Laterality Date   CESAREAN SECTION     COLONOSCOPY N/A 05/28/2014   Procedure: COLONOSCOPY;  Surgeon: Danie Binder, MD;  Location: [facility];  Service: Endoscopy;  Laterality: N/A;  8:30 AM   HYSTEROSCOPY WITH D & C N/A 11/07/2016   Procedure: DILATATION AND CURETTAGE /HYSTEROSCOPY;  Surgeon: Florian Buff, MD;  Location: [facility] ORS;  Service: Gynecology;  Laterality: N/A;    Social History   Socioeconomic History   Marital status: Married    Spouse name: Not on file   Number of children: 2   Years of education: Not on file   Highest education level: Not on file  Occupational History   Not on file  Tobacco Use   Smoking status: Never   Smokeless tobacco: Never  Vaping Use   Vaping Use: Never used  Substance and Sexual Activity   Alcohol use: No    Alcohol/week: 0.0 standard drinks of alcohol   Drug use: No   Sexual activity: Not Currently  Other Topics Concern   Not on file  Social History Narrative   Not on file   Social Determinants of Health   Financial Resource Strain: Not on file  Food Insecurity: Not on file  Transportation Needs: Not on file  Physical Activity: Not on file  Stress: Not on file  Social Connections: Not on file    Family History  Problem Relation Age of Onset   Heart attack Father     Outpatient Encounter Medications as of 05/03/2022  Medication Sig   amiodarone (PACERONE) 200 MG tablet  TAKE ONE TABLET BY MOUTH EVERY MORNING   atorvastatin (LIPITOR) 10 MG tablet Take 10 mg by mouth daily at 6 PM.    benzonatate (TESSALON) 200 MG capsule Take 200 mg by mouth 2 (two) times daily as needed.   Calcium 500-2.5 MG-MCG CHEW Chew 1 tablet by mouth every morning.   Calcium Carbonate-Vitamin D (CALCIUM 500/VITAMIN D PO) Take 600 mg by mouth.   CARTIA XT 120 MG 24 hr capsule TAKE 1 CAPSULE(120 MG) BY MOUTH DAILY   cetirizine (ZYRTEC) 10 MG tablet Take 10 mg by mouth daily.   cholecalciferol (VITAMIN D3) 25 MCG (1000 UNIT) tablet Take 1,000 Units by mouth daily.   DILT-XR 120 MG 24 hr capsule Take 120 mg by mouth every morning.   ELIQUIS 5 MG TABS tablet TAKE 1  TABLET(5 MG) BY MOUTH TWICE DAILY   levothyroxine (SYNTHROID) 25 MCG tablet Take 1 tablet (25 mcg total) by mouth daily before breakfast.   megestrol (MEGACE) 20 MG tablet Take 1 tablet twice a day   olmesartan-hydrochlorothiazide (BENICAR HCT) 40-25 MG tablet Take 1 tablet by mouth every morning.   [DISCONTINUED] levothyroxine (SYNTHROID) 25 MCG tablet TAKE ONE TABLET BY MOUTH BEFORE breakfast   No facility-administered encounter medications on file as of 05/03/2022.    ALLERGIES: Allergies  Allergen Reactions   Hydrocodone Itching   VACCINATION STATUS: Immunization History  Administered Date(s) Administered   Influenza-Unspecified 07/06/2018     HPI   ARCHER VISE is a patient with the above medical history. It was incidentally found during recent blood work that her thyroid function was off.  She denies taking Biotin supplement.  She has never had any imaging of her thyroid in the past.  In review of her medical records, she is on Amiodarone which over time can cause thyroid dysfunction.  She has positive thyroid antibodies meaning she has autoimmune thyroid dysfunction.  I reviewed patient's thyroid tests:  Lab Results  Component Value Date   TSH 6.000 (H) 04/30/2022   TSH 10.600 (H) 12/12/2021   TSH 16.700  (H) 09/14/2021   TSH 14.501 (H) 08/17/2021   TSH 14.976 (H) 06/20/2021   TSH 3.534 11/26/2013   FREET4 1.60 04/30/2022   FREET4 1.32 12/12/2021   FREET4 1.13 09/14/2021     Pt does not have any specific symptoms of thyroid dysfunction.  Pt denies feeling nodules in neck, hoarseness, dysphagia/odynophagia, SOB with lying down.  she denies family history of thyroid disorders.  No family history of thyroid cancer.  No history of radiation therapy to head or neck.  No recent use of iodine supplements.  Denies use of Biotin containing supplements.    I reviewed her chart and she also has a history of Afib, CKD, HTN, HLD.   Review of systems  Constitutional: + Minimally fluctuating body weight,  current Body mass index is 34.96 kg/m. , no fatigue, no subjective hyperthermia, no subjective hypothermia Eyes: no blurry vision, no xerophthalmia ENT: no sore throat, no nodules palpated in throat, no dysphagia/odynophagia, no hoarseness Cardiovascular: no chest pain, no shortness of breath, no palpitations, no leg swelling Respiratory: no cough, no shortness of breath Gastrointestinal: no nausea/vomiting/diarrhea Musculoskeletal: no muscle/joint aches Skin: no rashes, no hyperemia Neurological: no tremors, no numbness, no tingling, no dizziness Psychiatric: no depression, no anxiety   Objective:   Objective     BP 134/80   Pulse 72   Ht '5\' 1"'$  (1.549 m)   Wt 185 lb (83.9 kg)   BMI 34.96 kg/m  Wt Readings from Last 3 Encounters:  05/03/22 185 lb (83.9 kg)  12/18/21 188 lb 6.4 oz (85.5 kg)  10/31/21 191 lb 2.2 oz (86.7 kg)    BP Readings from Last 3 Encounters:  05/03/22 134/80  12/18/21 (!) 142/75  10/31/21 (!) 155/81      Physical Exam- Limited  Constitutional:  Body mass index is 34.96 kg/m. , not in acute distress, normal state of mind Eyes:  EOMI, no exophthalmos Neck: Supple Cardiovascular: RRR, no murmurs, rubs, or gallops, no edema Respiratory: Adequate  breathing efforts, no crackles, rales, rhonchi, or wheezing Musculoskeletal: no gross deformities, strength intact in all four extremities, no gross restriction of joint movements Skin:  no rashes, no hyperemia Neurological: no tremor with outstretched hands   CMP ( most recent) CMP  Component Value Date/Time   NA 141 10/24/2021 1510   K 3.8 10/24/2021 1510   CL 110 10/24/2021 1510   CO2 25 10/24/2021 1510   GLUCOSE 104 (H) 10/24/2021 1510   BUN 24 (H) 10/24/2021 1510   CREATININE 1.58 (H) 10/24/2021 1510   CALCIUM 9.4 10/24/2021 1510   PROT 7.4 10/24/2021 1510   ALBUMIN 4.2 10/24/2021 1510   AST 16 10/24/2021 1510   ALT 14 10/24/2021 1510   ALKPHOS 110 10/24/2021 1510   BILITOT 0.6 10/24/2021 1510   GFRNONAA 34 (L) 10/24/2021 1510   GFRAA 51 (L) 04/13/2020 1222     Diabetic Labs (most recent): No results found for: "HGBA1C", "MICROALBUR"   Lipid Panel ( most recent) Lipid Panel  No results found for: "CHOL", "TRIG", "HDL", "CHOLHDL", "VLDL", "LDLCALC", "LDLDIRECT", "LABVLDL"     Lab Results  Component Value Date   TSH 6.000 (H) 04/30/2022   TSH 10.600 (H) 12/12/2021   TSH 16.700 (H) 09/14/2021   TSH 14.501 (H) 08/17/2021   TSH 14.976 (H) 06/20/2021   TSH 3.534 11/26/2013   FREET4 1.60 04/30/2022   FREET4 1.32 12/12/2021   FREET4 1.13 09/14/2021      Latest Reference Range & Units 06/20/21 15:12 08/17/21 14:55 09/14/21 15:28 12/12/21 13:39 04/30/22 14:15  TSH 0.450 - 4.500 uIU/mL 14.976 (H) 14.501 (H) 16.700 (H) 10.600 (H) 6.000 (H)  Triiodothyronine,Free,Serum 2.0 - 4.4 pg/mL   2.9    T4,Free(Direct) 0.82 - 1.77 ng/dL   1.13 1.32 1.60  Thyroperoxidase Ab SerPl-aCnc 0 - 34 IU/mL   >600 (H)    Thyroglobulin Antibody 0.0 - 0.9 IU/mL   1.2 (H)    (H): Data is abnormally high  Assessment & Plan:   ASSESSMENT / PLAN:  1. Hypothyroidism-r/t Hashimotos thyroiditis  Her previsit thyroid function tests are consistent with appropriate hormone replacement.   Labs look much better now that her bubble packs separate her thyroid hormone from other meds.  She is advised to continue Levothyroxine 25 mcg po daily before breakfast.   - We discussed about correct intake of levothyroxine, at fasting, with water, separated by at least 30 minutes from breakfast, and separated by more than 4 hours from calcium, iron, multivitamins, acid reflux medications (PPIs). -Patient is made aware of the fact that thyroid hormone replacement is needed for life, dose to be adjusted by periodic monitoring of thyroid function tests.     I spent 20 minutes in the care of the patient today including review of labs from Thyroid Function, CMP, and other relevant labs ; imaging/biopsy records (current and previous including abstractions from other facilities); face-to-face time discussing  her lab results and symptoms, medications doses, her options of short and long term treatment based on the latest standards of care / guidelines;   and documenting the encounter.  Prescott Gum Cumba  participated in the discussions, expressed understanding, and voiced agreement with the above plans.  All questions were answered to her satisfaction. she is encouraged to contact clinic should she have any questions or concerns prior to her return visit.   FOLLOW UP PLAN:  Return in about 6 months (around 11/02/2022) for Thyroid follow up, Previsit labs, Virtual visit ok.  Rayetta Pigg, [facility] [address] [place], [place] [address] Phone: [phone] Fax: [phone]  05/03/2022, 11:19 AM

## 2022-05-04 DIAGNOSIS — I480 Paroxysmal atrial fibrillation: Secondary | ICD-10-CM | POA: Diagnosis not present

## 2022-05-04 DIAGNOSIS — N1832 Chronic kidney disease, stage 3b: Secondary | ICD-10-CM | POA: Diagnosis not present

## 2022-05-04 DIAGNOSIS — E782 Mixed hyperlipidemia: Secondary | ICD-10-CM | POA: Diagnosis not present

## 2022-05-04 DIAGNOSIS — I129 Hypertensive chronic kidney disease with stage 1 through stage 4 chronic kidney disease, or unspecified chronic kidney disease: Secondary | ICD-10-CM | POA: Diagnosis not present

## 2022-05-14 DIAGNOSIS — N1832 Chronic kidney disease, stage 3b: Secondary | ICD-10-CM | POA: Diagnosis not present

## 2022-05-14 DIAGNOSIS — I129 Hypertensive chronic kidney disease with stage 1 through stage 4 chronic kidney disease, or unspecified chronic kidney disease: Secondary | ICD-10-CM | POA: Diagnosis not present

## 2022-05-14 DIAGNOSIS — E872 Acidosis, unspecified: Secondary | ICD-10-CM | POA: Diagnosis not present

## 2022-06-04 DIAGNOSIS — I480 Paroxysmal atrial fibrillation: Secondary | ICD-10-CM | POA: Diagnosis not present

## 2022-06-04 DIAGNOSIS — N1832 Chronic kidney disease, stage 3b: Secondary | ICD-10-CM | POA: Diagnosis not present

## 2022-06-04 DIAGNOSIS — I129 Hypertensive chronic kidney disease with stage 1 through stage 4 chronic kidney disease, or unspecified chronic kidney disease: Secondary | ICD-10-CM | POA: Diagnosis not present

## 2022-06-04 DIAGNOSIS — E782 Mixed hyperlipidemia: Secondary | ICD-10-CM | POA: Diagnosis not present

## 2022-07-30 IMAGING — MG DIGITAL SCREENING BILAT W/ TOMO W/ CAD
8 series · 9 of 24 positions shown · non-contrast
Comparison: Previous exam(s).

CLINICAL DATA: Screening.

EXAM:
DIGITAL SCREENING BILATERAL MAMMOGRAM WITH TOMO AND CAD

[L CC synth-2D]
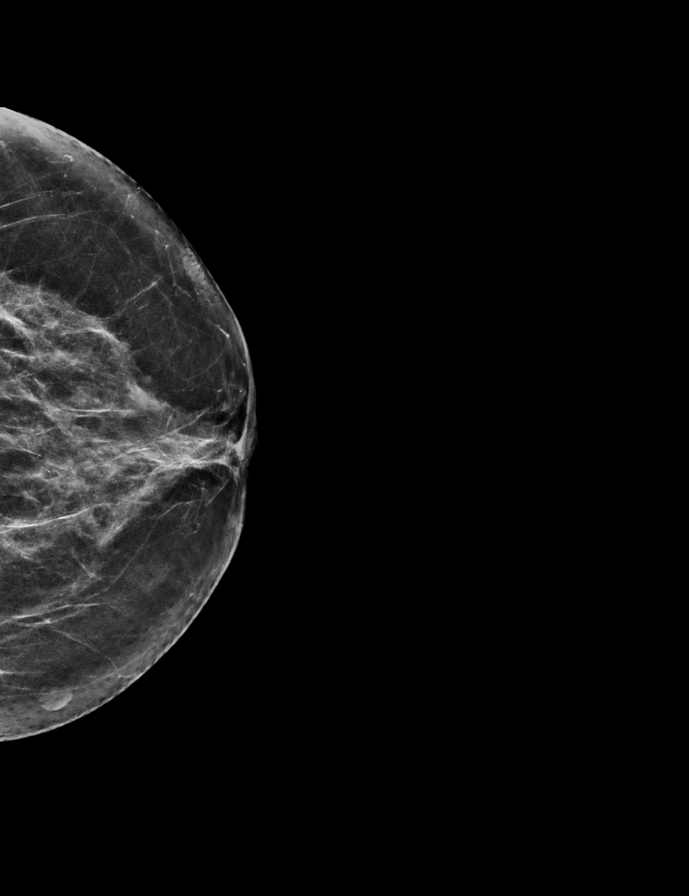

[R MLO synth-2D]
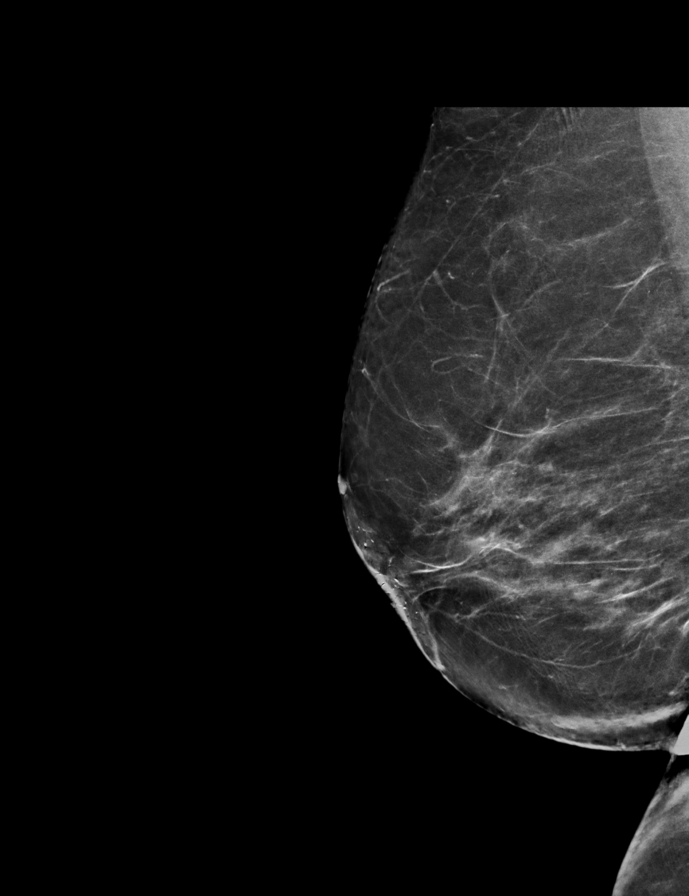

[R CC synth-2D]
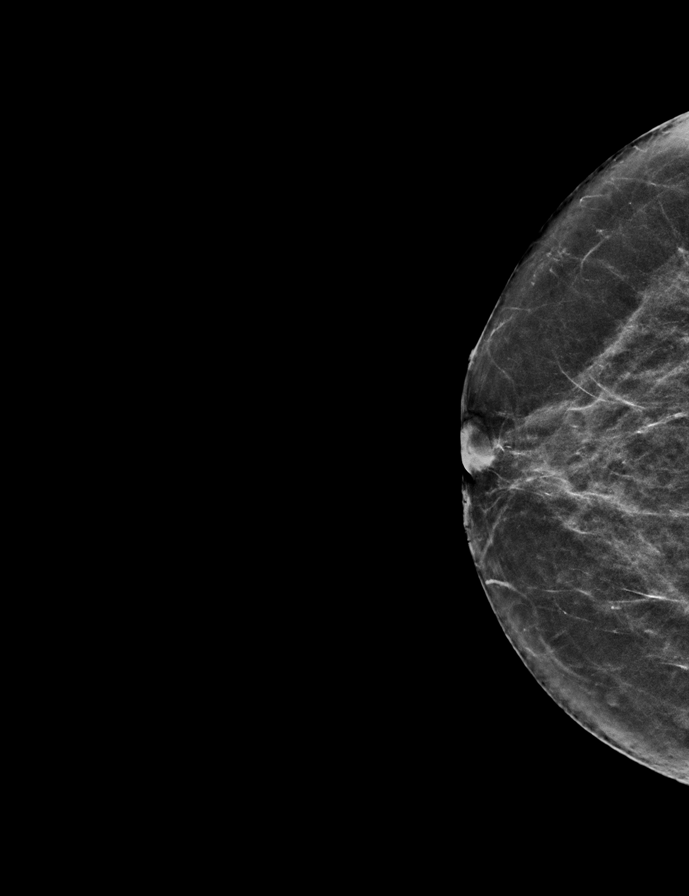

[L MLO synth-2D]
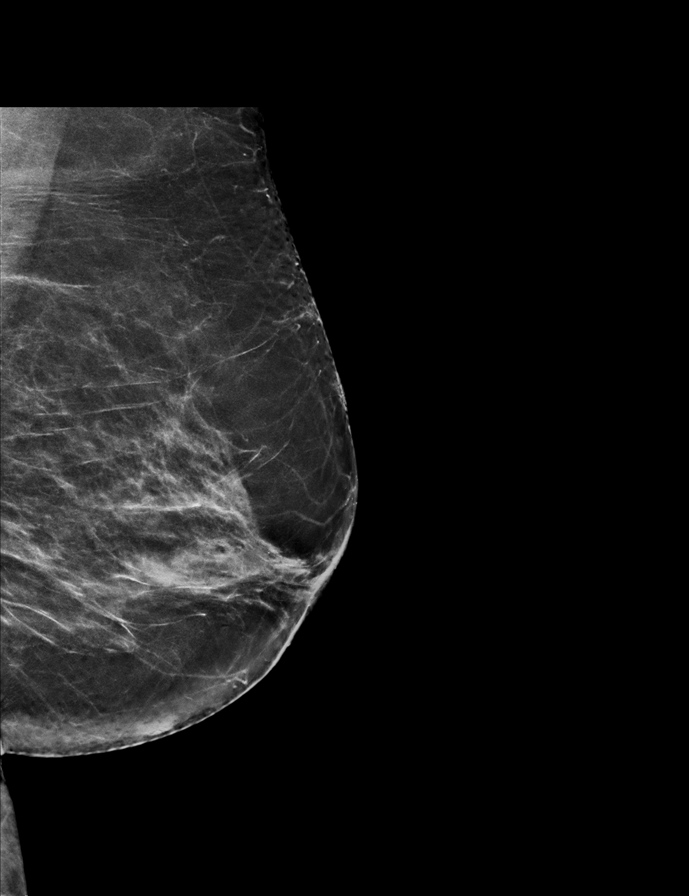

[L MLO tomo · 2 of 67 frames shown]
[frame 22/67]
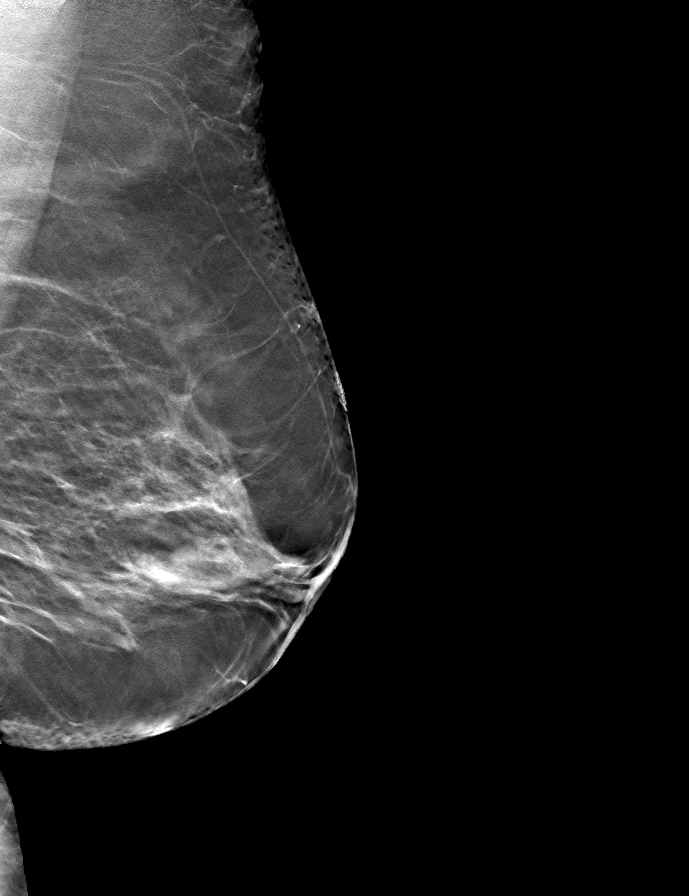
[frame 34/67]
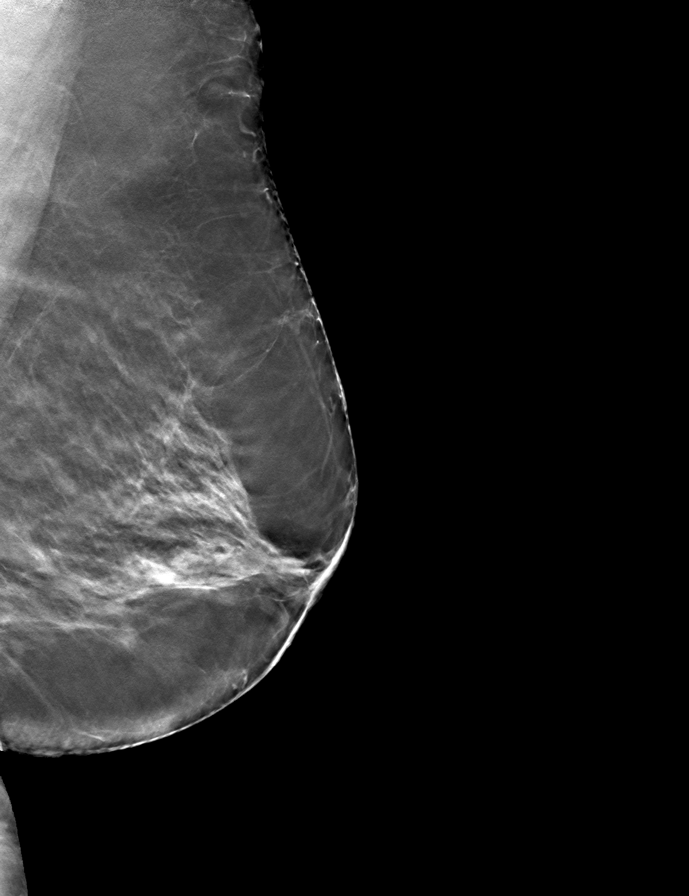

[L CC tomo · tomo slice 31/60.0]
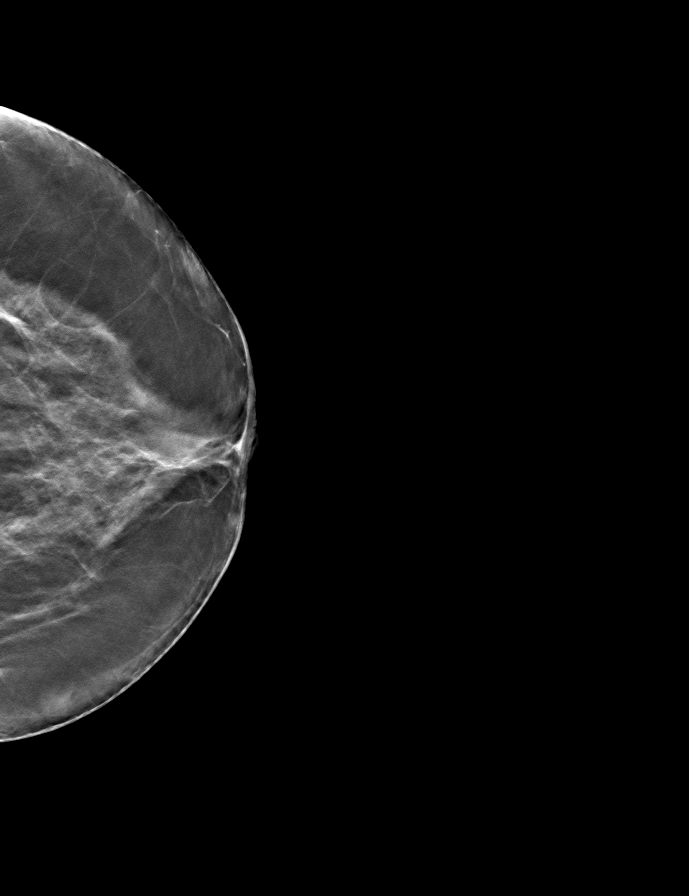

[R CC tomo · tomo slice 28/55.0]
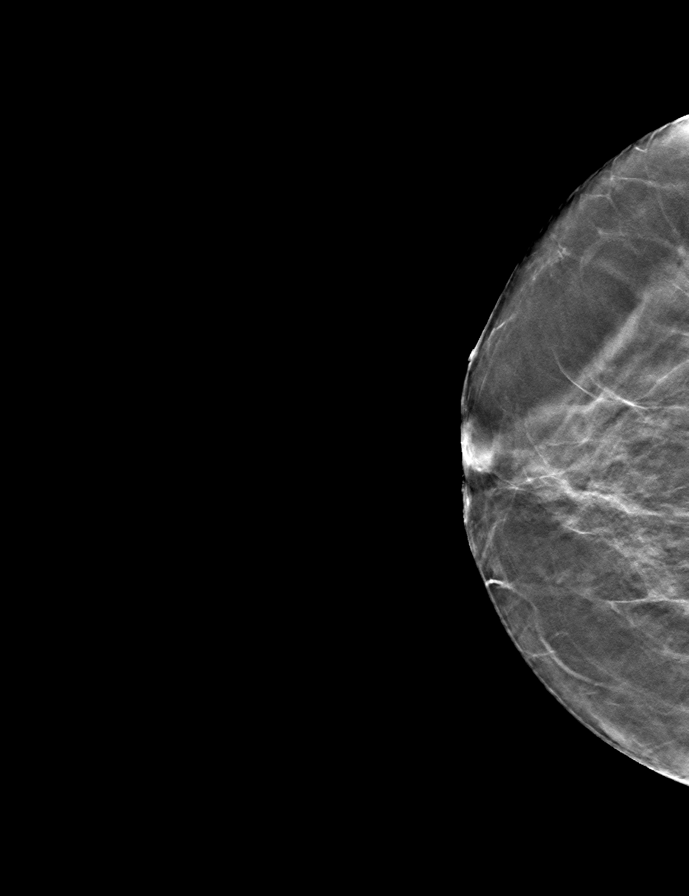

[R MLO tomo · tomo slice 33/66.0]
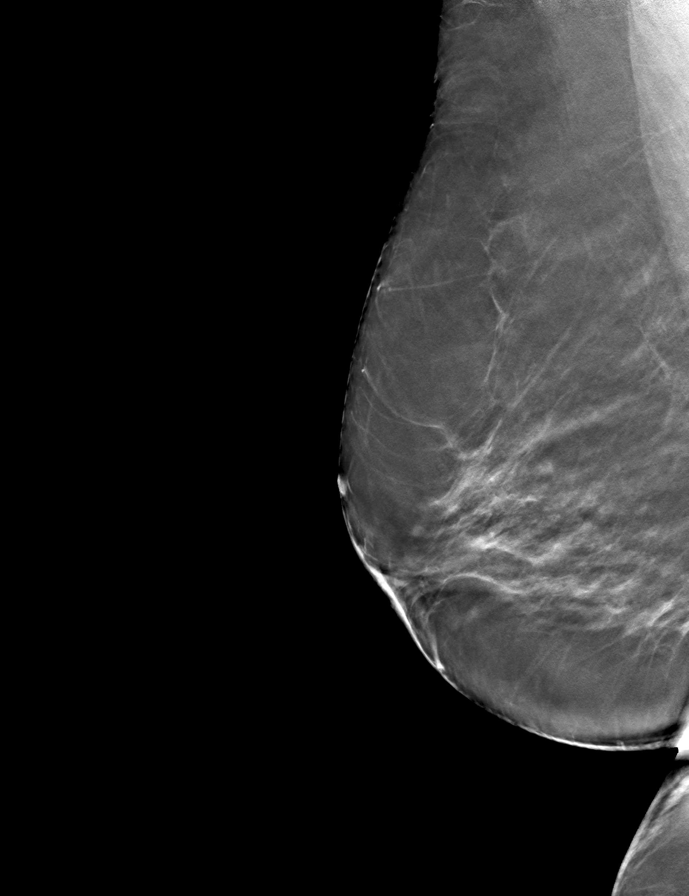

[9 of 24 positions shown; findings below may reference images not displayed]

ACR Breast Density Category c: The breast tissue is heterogeneously
dense, which may obscure small masses.
FINDINGS: There are no findings suspicious for malignancy. Images were
processed with CAD.
IMPRESSION: No mammographic evidence of malignancy. A result letter of this
screening mammogram will be mailed directly to the patient.

RECOMMENDATION:
Screening mammogram in one year. (Code:FT-U-LHB)

BI-RADS CATEGORY  1: Negative.

## 2022-08-08 DIAGNOSIS — Z23 Encounter for immunization: Secondary | ICD-10-CM | POA: Diagnosis not present

## 2022-08-08 DIAGNOSIS — N390 Urinary tract infection, site not specified: Secondary | ICD-10-CM | POA: Diagnosis not present

## 2022-08-13 DIAGNOSIS — H40051 Ocular hypertension, right eye: Secondary | ICD-10-CM | POA: Diagnosis not present

## 2022-08-19 ENCOUNTER — Other Ambulatory Visit: Payer: Self-pay | Admitting: Cardiology

## 2022-09-13 ENCOUNTER — Telehealth: Payer: Self-pay | Admitting: Cardiology

## 2022-09-13 NOTE — Telephone Encounter (Signed)
   Pre-operative Risk Assessment    Patient Name: Vanessa Anderson  DOB: Dec 05, 1945 MRN: [id number]     Request for Surgical Clearance    Procedure:  Dental Extraction - Amount of Teeth to be Pulled:  3  Date of Surgery:  Clearance TBD                                 Surgeon:  Dr. Roger Kill Group or Practice Name:  [facility] Phone number:  [phone] Fax number:  [phone]   Type of Clearance Requested:   - Pharmacy:  Hold Apixaban (Eliquis) Recommendations for Eliquis   Type of Anesthesia:  Not Indicated   Additional requests/questions:    Louretta Shorten   09/13/2022, 1:01 PM

## 2022-09-17 NOTE — Telephone Encounter (Signed)
   Patient Name: Vanessa Anderson  DOB: 11/11/1945 MRN: [id number]  Primary Cardiologist: Rozann Lesches, MD  Chart reviewed as part of pre-operative protocol coverage. Pre-op clearance already addressed by colleagues in earlier phone notes. To summarize recommendations:  -Patient does not require pre-op antibiotics for dental procedure.   Per office protocol, patient can hold Eliquis for 2 days prior to procedure.   Patient will not need bridging with Lovenox (enoxaparin) around procedure.  Medical clearance not requested. If needed, then would need to be seen in the clinic since last appointment was 06/2021.   Will route this bundled recommendation to requesting provider via Epic fax function and remove from pre-op pool. Please call with questions.  Elgie Collard, PA-C 09/17/2022, 11:52 AM

## 2022-09-17 NOTE — Telephone Encounter (Signed)
Patient with diagnosis of atrial fibrillation on Eliquis for anticoagulation.    Procedure:  Dental Extraction - Amount of Teeth to be Pulled:  3   Date of Surgery:  Clearance TBD     CHA2DS2-VASc Score = 4   This indicates a 4.8% annual risk of stroke. The patient's score is based upon: CHF History: 0 HTN History: 1 Diabetes History: 0 Stroke History: 0 Vascular Disease History: 0 Age Score: 2 Gender Score: 1    CrCl 43 Platelet count 243  Patient does not require pre-op antibiotics for dental procedure.  Per office protocol, patient can hold Eliquis for 2 days prior to procedure.   Patient will not need bridging with Lovenox (enoxaparin) around procedure.  **This guidance is not considered finalized until pre-operative APP has relayed final recommendations.**

## 2022-09-19 ENCOUNTER — Telehealth: Payer: Self-pay | Admitting: Cardiology

## 2022-09-19 ENCOUNTER — Other Ambulatory Visit: Payer: Self-pay | Admitting: Cardiology

## 2022-09-19 NOTE — Telephone Encounter (Signed)
*  STAT* If patient is at the pharmacy, call can be transferred to refill team.   1. Which medications need to be refilled? (please list name of each medication and dose if known)   amiodarone (PACERONE) 200 MG tablet   2. Which pharmacy/location (including street and city if local pharmacy) is medication to be sent to?  [facility] - [place], [place] - [place] [address]   3. Do they need a 30 day or 90 day supply?  30 day  Caller stated patient is almost out of this medication.

## 2022-09-19 NOTE — Telephone Encounter (Signed)
Medication sent to [facility]:  amiodarone (PACERONE) 200 MG tablet 60 tablet 1 09/19/2022    Sig: TAKE ONE TABLET BY MOUTH EVERY MORNING   Sent to pharmacy as: amiodarone (PACERONE) 200 MG tablet   E-Prescribing Status: Receipt confirmed by pharmacy (09/19/2022  9:12 AM EST)

## 2022-09-20 HISTORY — PX: TOOTH EXTRACTION: SUR596

## 2022-10-02 NOTE — Progress Notes (Unsigned)
Cardiology Office Note  Date: 10/03/2022   ID: Cicilia, Clinger 10/24/1946, MRN [id number]  PCP:  Celene Squibb, MD  Cardiologist:  Rozann Lesches, MD Electrophysiologist:  None   Chief Complaint  Patient presents with   Cardiac follow-up    History of Present Illness: Vanessa Anderson is a 76 y.o. female last seen in August 2022.  She is here today with her daughter for a follow-up visit.  Overall doing well from a cardiac perspective, no exertional chest pain or sense of palpitations.  She states that she has been compliant with her medications as outlined below.  I personally reviewed her ECG which shows normal sinus rhythm.  I also went over her lab work, TSH and LFTs were normal in June.  She does not report any spontaneous bleeding problems on Eliquis.  She has had cataract surgery and also dental extraction since I last saw her.  Tolerated these well.  Past Medical History:  Diagnosis Date   Chronic kidney disease    Essential hypertension    Fibromyalgia    Hyperlipidemia    Paroxysmal atrial fibrillation [facility])     Past Surgical History:  Procedure Laterality Date   CATARACT EXTRACTION Bilateral 01/2022   CESAREAN SECTION     COLONOSCOPY N/A 05/28/2014   Procedure: COLONOSCOPY;  Surgeon: Danie Binder, MD;  [facility];  Service: Endoscopy;  Laterality: N/A;  8:30 AM   HYSTEROSCOPY WITH D & C N/A 11/07/2016   Procedure: DILATATION AND CURETTAGE /HYSTEROSCOPY;  Surgeon: Florian Buff, MD;  Location: [facility];  Service: Gynecology;  Laterality: N/A;   TOOTH EXTRACTION  09/20/2022   3 wisdom teeth    Current Outpatient Medications  Medication Sig Dispense Refill   amiodarone (PACERONE) 200 MG tablet TAKE ONE TABLET BY MOUTH EVERY MORNING 60 tablet 1   atorvastatin (LIPITOR) 10 MG tablet Take 10 mg by mouth daily at 6 PM.      Calcium 500-2.5 MG-MCG CHEW Chew 1 tablet by mouth every morning.     Calcium Carbonate-Vitamin D (CALCIUM 500/VITAMIN D  PO) Take 600 mg by mouth.     CARTIA XT 120 MG 24 hr capsule TAKE 1 CAPSULE(120 MG) BY MOUTH DAILY 90 capsule 1   cetirizine (ZYRTEC) 10 MG tablet Take 10 mg by mouth daily.     cholecalciferol (VITAMIN D3) 25 MCG (1000 UNIT) tablet Take 1,000 Units by mouth daily.     DILT-XR 120 MG 24 hr capsule Take 120 mg by mouth every morning.     ELIQUIS 5 MG TABS tablet TAKE 1 TABLET(5 MG) BY MOUTH TWICE DAILY 180 tablet 0   levothyroxine (SYNTHROID) 25 MCG tablet Take 1 tablet (25 mcg total) by mouth daily before breakfast. 90 tablet 3   megestrol (MEGACE) 20 MG tablet Take 1 tablet twice a day 60 tablet 11   olmesartan-hydrochlorothiazide (BENICAR HCT) 40-25 MG tablet Take 1 tablet by mouth every morning.     No current facility-administered medications for this visit.   Allergies:  Hydrocodone   ROS: No orthopnea or PND.  No syncope.  Physical Exam: VS:  BP (!) 142/62   Pulse 68   Ht '5\' 2"'$  (1.575 m)   Wt 185 lb (83.9 kg)   SpO2 98%   BMI 33.84 kg/m , BMI Body mass index is 33.84 kg/m.  Wt Readings from Last 3 Encounters:  10/03/22 185 lb (83.9 kg)  05/03/22 185 lb (83.9 kg)  12/18/21 188 lb  6.4 oz (85.5 kg)    General: Patient appears comfortable at rest. HEENT: Conjunctiva and lids normal. Neck: Supple, no elevated JVP or carotid bruits. Lungs: Clear to auscultation, nonlabored breathing at rest. Cardiac: Regular rate and rhythm, no S3 or significant systolic murmur. Extremities: No pitting edema.  ECG:  An ECG dated 10/25/2020 was personally reviewed today and demonstrated:  Sinus rhythm.  Recent Labwork: 10/24/2021: ALT 14; AST 16; BUN 24; Creatinine, Ser 1.58; Hemoglobin 12.7; Platelets 260; Potassium 3.8; Sodium 141 04/30/2022: TSH 6.000  June 2023: Hemoglobin 12.6, platelets 243, BUN 25, creatinine 1.49, potassium 4.9, AST 13, ALT 18  Other Studies Reviewed Today:  Echocardiogram 03/29/2016: Study Conclusions   - Left ventricle: The cavity size was normal. Wall  thickness was   increased in a pattern of mild LVH. Systolic function was normal.   The estimated ejection fraction was in the range of 55% to 60%.   Wall motion was normal; there were no regional wall motion   abnormalities. Left ventricular diastolic function parameters   were normal for the patient&'s age. - Aortic valve: Mildly calcified annulus. Trileaflet. - Mitral valve: There was mild regurgitation. - Left atrium: The atrium was mildly dilated. - Right atrium: Central venous pressure (est): 3 mm Hg. - Tricuspid valve: There was trivial regurgitation. - Pulmonary arteries: Systolic pressure could not be accurately   estimated. - Pericardium, extracardiac: A small pericardial effusion was   identified posterior to the heart.   Impressions:   - Mild LVH with LVEF 55-60% and grossly normal diastolic function.   Mild left atrial enlargement. Mild mitral regurgitation. Mildly   sclerotic aortic annulus. Trivial tricuspid regurgitation. Small   posterior pericardial effusion.  Assessment and Plan:  1.  Persistent atrial fibrillation with CHA2DS2-VASc score 3.  She reports no palpitations and is in sinus rhythm with normal ECG today.  Continue amiodarone, cardia XT, and Eliquis as before.  TSH and LFTs were normal in June.  She does not report any spontaneous bleeding problems.  2.  Essential hypertension, continue Benicar HCT and keep follow-up with Dr. [name] as before.  Medication Adjustments/Labs and Tests Ordered: Current medicines are reviewed at length with the patient today.  Concerns regarding medicines are outlined above.   Tests Ordered: Orders Placed This Encounter  Procedures   EKG 12-Lead    Medication Changes: No orders of the defined types were placed in this encounter.   Disposition:  Follow up  1 year, sooner if needed.  Signed, Satira Sark, MD, [facility] 10/03/2022 2:11 PM    [facility] [address]  [place], [place] [address] Phone: [phone]; Fax: [phone]

## 2022-10-03 ENCOUNTER — Encounter: Payer: Self-pay | Admitting: Cardiology

## 2022-10-03 ENCOUNTER — Ambulatory Visit: Payer: PPO | Attending: Cardiology | Admitting: Cardiology

## 2022-10-03 VITALS — BP 142/62 | HR 68 | Ht 62.0 in | Wt 185.0 lb

## 2022-10-03 DIAGNOSIS — I10 Essential (primary) hypertension: Secondary | ICD-10-CM

## 2022-10-03 DIAGNOSIS — I480 Paroxysmal atrial fibrillation: Secondary | ICD-10-CM

## 2022-10-03 NOTE — Patient Instructions (Signed)
Medication Instructions:  Your physician recommends that you continue on your current medications as directed. Please refer to the Current Medication list given to you today.   Labwork: None today  Testing/Procedures: None today  Follow-Up: 1 year  Any Other Special Instructions Will Be Listed Below (If Applicable).  If you need a refill on your cardiac medications before your next appointment, please call your pharmacy.  

## 2022-10-05 ENCOUNTER — Other Ambulatory Visit (HOSPITAL_COMMUNITY): Payer: Self-pay | Admitting: Internal Medicine

## 2022-10-05 DIAGNOSIS — Z1231 Encounter for screening mammogram for malignant neoplasm of breast: Secondary | ICD-10-CM

## 2022-10-08 DIAGNOSIS — E782 Mixed hyperlipidemia: Secondary | ICD-10-CM | POA: Diagnosis not present

## 2022-10-08 DIAGNOSIS — N1832 Chronic kidney disease, stage 3b: Secondary | ICD-10-CM | POA: Diagnosis not present

## 2022-10-15 DIAGNOSIS — H269 Unspecified cataract: Secondary | ICD-10-CM | POA: Diagnosis not present

## 2022-10-15 DIAGNOSIS — R7303 Prediabetes: Secondary | ICD-10-CM | POA: Diagnosis not present

## 2022-10-15 DIAGNOSIS — I129 Hypertensive chronic kidney disease with stage 1 through stage 4 chronic kidney disease, or unspecified chronic kidney disease: Secondary | ICD-10-CM | POA: Diagnosis not present

## 2022-10-15 DIAGNOSIS — R809 Proteinuria, unspecified: Secondary | ICD-10-CM | POA: Diagnosis not present

## 2022-10-15 DIAGNOSIS — D72829 Elevated white blood cell count, unspecified: Secondary | ICD-10-CM | POA: Diagnosis not present

## 2022-10-15 DIAGNOSIS — N939 Abnormal uterine and vaginal bleeding, unspecified: Secondary | ICD-10-CM | POA: Diagnosis not present

## 2022-10-15 DIAGNOSIS — E669 Obesity, unspecified: Secondary | ICD-10-CM | POA: Diagnosis not present

## 2022-10-15 DIAGNOSIS — R748 Abnormal levels of other serum enzymes: Secondary | ICD-10-CM | POA: Diagnosis not present

## 2022-10-15 DIAGNOSIS — N1832 Chronic kidney disease, stage 3b: Secondary | ICD-10-CM | POA: Diagnosis not present

## 2022-10-15 DIAGNOSIS — I480 Paroxysmal atrial fibrillation: Secondary | ICD-10-CM | POA: Diagnosis not present

## 2022-10-15 DIAGNOSIS — E782 Mixed hyperlipidemia: Secondary | ICD-10-CM | POA: Diagnosis not present

## 2022-10-15 DIAGNOSIS — Z0000 Encounter for general adult medical examination without abnormal findings: Secondary | ICD-10-CM | POA: Diagnosis not present

## 2022-10-22 DIAGNOSIS — D6869 Other thrombophilia: Secondary | ICD-10-CM | POA: Diagnosis not present

## 2022-10-22 DIAGNOSIS — Z7901 Long term (current) use of anticoagulants: Secondary | ICD-10-CM | POA: Diagnosis not present

## 2022-10-22 DIAGNOSIS — J309 Allergic rhinitis, unspecified: Secondary | ICD-10-CM | POA: Diagnosis not present

## 2022-10-22 DIAGNOSIS — E039 Hypothyroidism, unspecified: Secondary | ICD-10-CM | POA: Diagnosis not present

## 2022-10-22 DIAGNOSIS — E785 Hyperlipidemia, unspecified: Secondary | ICD-10-CM | POA: Diagnosis not present

## 2022-10-22 DIAGNOSIS — E669 Obesity, unspecified: Secondary | ICD-10-CM | POA: Diagnosis not present

## 2022-10-22 DIAGNOSIS — I10 Essential (primary) hypertension: Secondary | ICD-10-CM | POA: Diagnosis not present

## 2022-10-22 DIAGNOSIS — I4891 Unspecified atrial fibrillation: Secondary | ICD-10-CM | POA: Diagnosis not present

## 2022-10-22 DIAGNOSIS — R32 Unspecified urinary incontinence: Secondary | ICD-10-CM | POA: Diagnosis not present

## 2022-10-22 DIAGNOSIS — N189 Chronic kidney disease, unspecified: Secondary | ICD-10-CM | POA: Diagnosis not present

## 2022-11-06 ENCOUNTER — Other Ambulatory Visit: Payer: Self-pay | Admitting: *Deleted

## 2022-11-06 ENCOUNTER — Encounter: Payer: Self-pay | Admitting: *Deleted

## 2022-11-06 DIAGNOSIS — D72829 Elevated white blood cell count, unspecified: Secondary | ICD-10-CM

## 2022-11-06 NOTE — Progress Notes (Signed)
Lab requisitions faxed to [facility] on [address] [place] @ [phone].  Telephone number [phone].

## 2022-11-08 ENCOUNTER — Other Ambulatory Visit: Payer: Self-pay | Admitting: *Deleted

## 2022-11-08 DIAGNOSIS — D72829 Elevated white blood cell count, unspecified: Secondary | ICD-10-CM

## 2022-11-08 DIAGNOSIS — N1832 Chronic kidney disease, stage 3b: Secondary | ICD-10-CM | POA: Diagnosis not present

## 2022-11-08 DIAGNOSIS — E063 Autoimmune thyroiditis: Secondary | ICD-10-CM | POA: Diagnosis not present

## 2022-11-08 DIAGNOSIS — E038 Other specified hypothyroidism: Secondary | ICD-10-CM | POA: Diagnosis not present

## 2022-11-09 LAB — TSH: TSH: 9.62 u[IU]/mL — ABNORMAL HIGH (ref 0.450–4.500)

## 2022-11-09 LAB — T4, FREE: Free T4: 1.5 ng/dL (ref 0.82–1.77)

## 2022-11-12 ENCOUNTER — Ambulatory Visit (INDEPENDENT_AMBULATORY_CARE_PROVIDER_SITE_OTHER): Payer: PPO | Admitting: Nurse Practitioner

## 2022-11-12 ENCOUNTER — Encounter: Payer: Self-pay | Admitting: Nurse Practitioner

## 2022-11-12 VITALS — BP 125/69 | HR 66 | Ht 62.0 in | Wt 185.6 lb

## 2022-11-12 DIAGNOSIS — E063 Autoimmune thyroiditis: Secondary | ICD-10-CM | POA: Diagnosis not present

## 2022-11-12 DIAGNOSIS — N1832 Chronic kidney disease, stage 3b: Secondary | ICD-10-CM | POA: Diagnosis not present

## 2022-11-12 DIAGNOSIS — I129 Hypertensive chronic kidney disease with stage 1 through stage 4 chronic kidney disease, or unspecified chronic kidney disease: Secondary | ICD-10-CM | POA: Diagnosis not present

## 2022-11-12 DIAGNOSIS — E872 Acidosis, unspecified: Secondary | ICD-10-CM | POA: Diagnosis not present

## 2022-11-12 DIAGNOSIS — E038 Other specified hypothyroidism: Secondary | ICD-10-CM | POA: Diagnosis not present

## 2022-11-12 MED ORDER — LEVOTHYROXINE SODIUM 25 MCG PO TABS: 25.0000 ug | ORAL_TABLET | Freq: Every day | ORAL | 3 refills | Status: DC

## 2022-11-12 NOTE — Progress Notes (Signed)
Endocrinology Follow Up Note                                         11/12/2022, 1:43 PM  Subjective:   Subjective    Vanessa Anderson is a 77 y.o.-year-old female patient being seen in follow up after being seen in consultation for hypothyroidism referred by Celene Squibb, MD.   Past Medical History:  Diagnosis Date   Chronic kidney disease    Essential hypertension    Fibromyalgia    Hyperlipidemia    Paroxysmal atrial fibrillation [facility])     Past Surgical History:  Procedure Laterality Date   CATARACT EXTRACTION Bilateral 01/2022   CESAREAN SECTION     COLONOSCOPY N/A 05/28/2014   Procedure: COLONOSCOPY;  Surgeon: Danie Binder, MD;  [facility];  Service: Endoscopy;  Laterality: N/A;  8:30 AM   HYSTEROSCOPY WITH D & C N/A 11/07/2016   Procedure: DILATATION AND CURETTAGE /HYSTEROSCOPY;  Surgeon: Florian Buff, MD;  Location: [facility];  Service: Gynecology;  Laterality: N/A;   TOOTH EXTRACTION  09/20/2022   3 wisdom teeth    Social History   Socioeconomic History   Marital status: Married    Spouse name: Not on file   Number of children: 2   Years of education: Not on file   Highest education level: Not on file  Occupational History   Not on file  Tobacco Use   Smoking status: Never   Smokeless tobacco: Never  Vaping Use   Vaping Use: Never used  Substance and Sexual Activity   Alcohol use: No    Alcohol/week: 0.0 standard drinks of alcohol   Drug use: No   Sexual activity: Not Currently  Other Topics Concern   Not on file  Social History Narrative   Not on file   Social Determinants of Health   Financial Resource Strain: Not on file  Food Insecurity: Not on file  Transportation Needs: Not on file  Physical Activity: Not on file  Stress: Not on file  Social Connections: Not on file    Family History  Problem Relation Age of Onset   Heart attack Father     Outpatient  Encounter Medications as of 11/12/2022  Medication Sig   amiodarone (PACERONE) 200 MG tablet TAKE ONE TABLET BY MOUTH EVERY MORNING   atorvastatin (LIPITOR) 10 MG tablet Take 10 mg by mouth daily at 6 PM.    Calcium 500-2.5 MG-MCG CHEW Chew 1 tablet by mouth every morning.   Calcium Carbonate-Vitamin D (CALCIUM 500/VITAMIN D PO) Take 600 mg by mouth.   CARTIA XT 120 MG 24 hr capsule TAKE 1 CAPSULE(120 MG) BY MOUTH DAILY   cetirizine (ZYRTEC) 10 MG tablet Take 10 mg by mouth daily.   cholecalciferol (VITAMIN D3) 25 MCG (1000 UNIT) tablet Take 1,000 Units by mouth daily.   DILT-XR 120 MG 24 hr capsule Take 120 mg by mouth every morning.   ELIQUIS 5 MG TABS tablet TAKE 1 TABLET(5  MG) BY MOUTH TWICE DAILY   megestrol (MEGACE) 20 MG tablet Take 1 tablet twice a day   olmesartan-hydrochlorothiazide (BENICAR HCT) 40-25 MG tablet Take 1 tablet by mouth every morning.   [DISCONTINUED] levothyroxine (SYNTHROID) 25 MCG tablet Take 1 tablet (25 mcg total) by mouth daily before breakfast.   levothyroxine (SYNTHROID) 25 MCG tablet Take 1 tablet (25 mcg total) by mouth daily before breakfast.   No facility-administered encounter medications on file as of 11/12/2022.    ALLERGIES: Allergies  Allergen Reactions   Hydrocodone Itching   VACCINATION STATUS: Immunization History  Administered Date(s) Administered   Influenza-Unspecified 07/06/2018     HPI   Vanessa Anderson is a patient with the above medical history. It was incidentally found during recent blood work that her thyroid function was off.  She denies taking Biotin supplement.  She has never had any imaging of her thyroid in the past.  In review of her medical records, she is on Amiodarone which over time can cause thyroid dysfunction.  She has positive thyroid antibodies meaning she has autoimmune thyroid dysfunction.  I reviewed patient's thyroid tests:  Lab Results  Component Value Date   TSH 9.620 (H) 11/08/2022   TSH 6.000 (H)  04/30/2022   TSH 10.600 (H) 12/12/2021   TSH 16.700 (H) 09/14/2021   TSH 14.501 (H) 08/17/2021   TSH 14.976 (H) 06/20/2021   TSH 3.534 11/26/2013   FREET4 1.50 11/08/2022   FREET4 1.60 04/30/2022   FREET4 1.32 12/12/2021   FREET4 1.13 09/14/2021     Pt does not have any specific symptoms of thyroid dysfunction.  Pt denies feeling nodules in neck, hoarseness, dysphagia/odynophagia, SOB with lying down.  she denies family history of thyroid disorders.  No family history of thyroid cancer.  No history of radiation therapy to head or neck.  No recent use of iodine supplements.  Denies use of Biotin containing supplements.    I reviewed her chart and she also has a history of Afib, CKD, HTN, HLD.   Review of systems  Constitutional: + Minimally fluctuating body weight,  current Body mass index is 33.95 kg/m. , no fatigue, no subjective hyperthermia, no subjective hypothermia Eyes: no blurry vision, no xerophthalmia ENT: no sore throat, no nodules palpated in throat, no dysphagia/odynophagia, no hoarseness Cardiovascular: no chest pain, no shortness of breath, no palpitations, no leg swelling Respiratory: no cough, no shortness of breath Gastrointestinal: no nausea/vomiting/diarrhea Musculoskeletal: no muscle/joint aches Skin: no rashes, no hyperemia Neurological: no tremors, no numbness, no tingling, no dizziness Psychiatric: no depression, no anxiety   Objective:   Objective     BP 125/69 (BP Location: Left Arm, Patient Position: Sitting, Cuff Size: Large)   Pulse 66   Ht '5\' 2"'$  (1.575 m)   Wt 185 lb 9.6 oz (84.2 kg)   BMI 33.95 kg/m  Wt Readings from Last 3 Encounters:  11/12/22 185 lb 9.6 oz (84.2 kg)  10/03/22 185 lb (83.9 kg)  05/03/22 185 lb (83.9 kg)    BP Readings from Last 3 Encounters:  11/12/22 125/69  10/03/22 (!) 142/62  05/03/22 134/80      Physical Exam- Limited  Constitutional:  Body mass index is 33.95 kg/m. , not in acute distress, normal  state of mind Eyes:  EOMI, no exophthalmos Neck: Supple Cardiovascular: RRR, no murmurs, rubs, or gallops, no edema Respiratory: Adequate breathing efforts, no crackles, rales, rhonchi, or wheezing Musculoskeletal: no gross deformities, strength intact in all four extremities, no gross restriction  of joint movements Skin:  no rashes, no hyperemia Neurological: no tremor with outstretched hands   CMP ( most recent) CMP     Component Value Date/Time   NA 141 10/24/2021 1510   K 3.8 10/24/2021 1510   CL 110 10/24/2021 1510   CO2 25 10/24/2021 1510   GLUCOSE 104 (H) 10/24/2021 1510   BUN 24 (H) 10/24/2021 1510   CREATININE 1.58 (H) 10/24/2021 1510   CALCIUM 9.4 10/24/2021 1510   PROT 7.4 10/24/2021 1510   ALBUMIN 4.2 10/24/2021 1510   AST 16 10/24/2021 1510   ALT 14 10/24/2021 1510   ALKPHOS 110 10/24/2021 1510   BILITOT 0.6 10/24/2021 1510   GFRNONAA 34 (L) 10/24/2021 1510   GFRAA 51 (L) 04/13/2020 1222     Diabetic Labs (most recent): No results found for: "HGBA1C", "MICROALBUR"   Lipid Panel ( most recent) Lipid Panel  No results found for: "CHOL", "TRIG", "HDL", "CHOLHDL", "VLDL", "LDLCALC", "LDLDIRECT", "LABVLDL"     Lab Results  Component Value Date   TSH 9.620 (H) 11/08/2022   TSH 6.000 (H) 04/30/2022   TSH 10.600 (H) 12/12/2021   TSH 16.700 (H) 09/14/2021   TSH 14.501 (H) 08/17/2021   TSH 14.976 (H) 06/20/2021   TSH 3.534 11/26/2013   FREET4 1.50 11/08/2022   FREET4 1.60 04/30/2022   FREET4 1.32 12/12/2021   FREET4 1.13 09/14/2021      Latest Reference Range & Units 06/20/21 15:12 08/17/21 14:55 09/14/21 15:28 12/12/21 13:39 04/30/22 14:15 11/08/22 13:50  TSH 0.450 - 4.500 uIU/mL 14.976 (H) 14.501 (H) 16.700 (H) 10.600 (H) 6.000 (H) 9.620 (H)  Triiodothyronine,Free,Serum 2.0 - 4.4 pg/mL   2.9     T4,Free(Direct) 0.82 - 1.77 ng/dL   1.13 1.32 1.60 1.50  Thyroperoxidase Ab SerPl-aCnc 0 - 34 IU/mL   >600 (H)     Thyroglobulin Antibody 0.0 - 0.9 IU/mL    1.2 (H)     (H): Data is abnormally high  Assessment & Plan:   ASSESSMENT / PLAN:  1. Hypothyroidism-r/t Hashimotos thyroiditis  Her previsit thyroid function tests are consistent with appropriate hormone replacement.  TSH still elevated but Free T4 on upper end of normal. She is advised to continue Levothyroxine 25 mcg po daily before breakfast. If labs stable at next visit, will push out visits to annual follow up.  - We discussed about correct intake of levothyroxine, at fasting, with water, separated by at least 30 minutes from breakfast, and separated by more than 4 hours from calcium, iron, multivitamins, acid reflux medications (PPIs). -Patient is made aware of the fact that thyroid hormone replacement is needed for life, dose to be adjusted by periodic monitoring of thyroid function tests.     I spent 24 minutes in the care of the patient today including review of labs from Thyroid Function, CMP, and other relevant labs ; imaging/biopsy records (current and previous including abstractions from other facilities); face-to-face time discussing  her lab results and symptoms, medications doses, her options of short and long term treatment based on the latest standards of care / guidelines;   and documenting the encounter.  Vanessa Anderson  participated in the discussions, expressed understanding, and voiced agreement with the above plans.  All questions were answered to her satisfaction. she is encouraged to contact clinic should she have any questions or concerns prior to her return visit.   FOLLOW UP PLAN:  Return in about 6 months (around 05/13/2023) for Thyroid follow up, Previsit labs.  Vanessa Anderson  Marinus Maw [facility] [address] [place], [place] [address] Phone: [phone] Fax: [phone]  11/12/2022, 1:43 PM

## 2022-11-12 NOTE — Patient Instructions (Signed)

## 2022-11-14 NOTE — Progress Notes (Unsigned)
[facility] [address] [place] [address]   CLINIC:  Medical Oncology/Hematology  PCP:  Celene Squibb, MD [place] [address] [phone]   REASON FOR VISIT:  Follow-up for leukocytosis  PRIOR THERAPY: None  CURRENT THERAPY: Surveillance  INTERVAL HISTORY:   Vanessa Anderson 77 y.o. female returns for routine follow-up of her leukocytosis.  She was last seen by Dr. Delton Coombes on 10/31/2021.  At today's visit, she reports feeling well.  No recent hospitalizations, surgeries, or changes in baseline health status.  She denies any recent infections or antibiotics.  She does not take any steroid medications.  She is a lifelong non-smoker.  She denies any unexplained fever, chills, night sweats, or weight loss.  She has not noticed any new lumps or bumps.  She has 50% energy and 100% appetite. She endorses that she is maintaining a stable weight.   ASSESSMENT & PLAN:  1.  Leukocytosis - Persistent leukocytosis since 2017 - Hematology workup negative for MPN (negative JAK2, CALR, MPL, BCR/ABL FISH) - Lifelong non-smoker.  No history of splenectomy, connective tissue disorders, or autoimmune/inflammatory conditions.  No steroid use.  No family history of malignancies. - No B symptoms or recurrent infections. - Most recent labs (11/08/2022 via [facility]) show WBC 10.6, ANC 7.4.  Otherwise normal CBC.  Normal LDH 199.  CMP shows chronic CKD stable around baseline. - No palpable adenopathy or splenomegaly. - She takes Eliquis for atrial fibrillation - PLAN: Overall stable and without evidence of myeloproliferative neoplasm. - Labs + office visit in 1 year. - If any worsening will consider bone marrow biopsy. - Would consider discharge from clinic if she continues to exhibit long-term stability.  PLAN SUMMARY: >> Labs (CBC/D, LDH, ESR, CRP, ANA, RF) in 1 year via [facility] >> Office visit in 1 year, after labs    REVIEW OF SYSTEMS:   Review of  Systems  Constitutional:  Positive for fatigue. Negative for appetite change, chills, diaphoresis, fever and unexpected weight change.  HENT:   Negative for lump/mass and nosebleeds.   Eyes:  Negative for eye problems.  Respiratory:  Negative for cough, hemoptysis and shortness of breath.   Cardiovascular:  Negative for chest pain, leg swelling and palpitations.  Gastrointestinal:  Negative for abdominal pain, blood in stool, constipation, diarrhea, nausea and vomiting.  Genitourinary:  Negative for hematuria.   Skin: Negative.   Neurological:  Negative for dizziness, headaches and light-headedness.  Hematological:  Does not bruise/bleed easily.     PHYSICAL EXAM:  ECOG PERFORMANCE STATUS: 0 - Asymptomatic  There were no vitals filed for this visit. There were no vitals filed for this visit. Physical Exam Constitutional:      Appearance: Normal appearance.  HENT:     Head: Normocephalic and atraumatic.     Mouth/Throat:     Mouth: Mucous membranes are moist.  Eyes:     Extraocular Movements: Extraocular movements intact.     Pupils: Pupils are equal, round, and reactive to light.  Cardiovascular:     Rate and Rhythm: Normal rate and regular rhythm.     Pulses: Normal pulses.     Heart sounds: Murmur heard.  Pulmonary:     Effort: Pulmonary effort is normal.     Breath sounds: Normal breath sounds.  Abdominal:     General: Bowel sounds are normal.     Palpations: Abdomen is soft.     Tenderness: There is no abdominal tenderness.  Musculoskeletal:  General: No swelling.     Right lower leg: No edema.     Left lower leg: No edema.  Lymphadenopathy:     Cervical: No cervical adenopathy.  Skin:    General: Skin is warm and dry.  Neurological:     General: No focal deficit present.     Mental Status: She is alert and oriented to person, place, and time.  Psychiatric:        Mood and Affect: Mood normal.        Behavior: Behavior normal.     PAST  MEDICAL/SURGICAL HISTORY:  Past Medical History:  Diagnosis Date   Chronic kidney disease    Essential hypertension    Fibromyalgia    Hyperlipidemia    Paroxysmal atrial fibrillation [facility])    Past Surgical History:  Procedure Laterality Date   CATARACT EXTRACTION Bilateral 01/2022   CESAREAN SECTION     COLONOSCOPY N/A 05/28/2014   Procedure: COLONOSCOPY;  Surgeon: Danie Binder, MD;  Location[facility];  Service: Endoscopy;  Laterality: N/A;  8:30 AM   HYSTEROSCOPY WITH D & C N/A 11/07/2016   Procedure: DILATATION AND CURETTAGE /HYSTEROSCOPY;  Surgeon: Florian Buff, MD;  Location: [facility] ORS;  Service: Gynecology;  Laterality: N/A;   TOOTH EXTRACTION  09/20/2022   3 wisdom teeth    SOCIAL HISTORY:  Social History   Socioeconomic History   Marital status: Married    Spouse name: Not on file   Number of children: 2   Years of education: Not on file   Highest education level: Not on file  Occupational History   Not on file  Tobacco Use   Smoking status: Never   Smokeless tobacco: Never  Vaping Use   Vaping Use: Never used  Substance and Sexual Activity   Alcohol use: No    Alcohol/week: 0.0 standard drinks of alcohol   Drug use: No   Sexual activity: Not Currently  Other Topics Concern   Not on file  Social History Narrative   Not on file   Social Determinants of Health   Financial Resource Strain: Not on file  Food Insecurity: Not on file  Transportation Needs: Not on file  Physical Activity: Not on file  Stress: Not on file  Social Connections: Not on file  Intimate Partner Violence: Not on file    FAMILY HISTORY:  Family History  Problem Relation Age of Onset   Heart attack Father     CURRENT MEDICATIONS:  Outpatient Encounter Medications as of 11/15/2022  Medication Sig   amiodarone (PACERONE) 200 MG tablet TAKE ONE TABLET BY MOUTH EVERY MORNING   atorvastatin (LIPITOR) 10 MG tablet Take 10 mg by mouth daily at 6 PM.    Calcium 500-2.5 MG-MCG  CHEW Chew 1 tablet by mouth every morning.   Calcium Carbonate-Vitamin D (CALCIUM 500/VITAMIN D PO) Take 600 mg by mouth.   CARTIA XT 120 MG 24 hr capsule TAKE 1 CAPSULE(120 MG) BY MOUTH DAILY   cetirizine (ZYRTEC) 10 MG tablet Take 10 mg by mouth daily.   cholecalciferol (VITAMIN D3) 25 MCG (1000 UNIT) tablet Take 1,000 Units by mouth daily.   DILT-XR 120 MG 24 hr capsule Take 120 mg by mouth every morning.   ELIQUIS 5 MG TABS tablet TAKE 1 TABLET(5 MG) BY MOUTH TWICE DAILY   levothyroxine (SYNTHROID) 25 MCG tablet Take 1 tablet (25 mcg total) by mouth daily before breakfast.   megestrol (MEGACE) 20 MG tablet Take 1 tablet twice  a day   olmesartan-hydrochlorothiazide (BENICAR HCT) 40-25 MG tablet Take 1 tablet by mouth every morning.   No facility-administered encounter medications on file as of 11/15/2022.    ALLERGIES:  Allergies  Allergen Reactions   Hydrocodone Itching    LABORATORY DATA:  I have reviewed the labs as listed.  CBC    Component Value Date/Time   WBC 12.7 (H) 10/24/2021 1510   RBC 4.31 10/24/2021 1510   HGB 12.7 10/24/2021 1510   HCT 40.8 10/24/2021 1510   PLT 260 10/24/2021 1510   MCV 94.7 10/24/2021 1510   MCH 29.5 10/24/2021 1510   MCHC 31.1 10/24/2021 1510   RDW 14.0 10/24/2021 1510   LYMPHSABS 2.2 10/24/2021 1510   MONOABS 0.9 10/24/2021 1510   EOSABS 0.1 10/24/2021 1510   BASOSABS 0.1 10/24/2021 1510      Latest Ref Rng & Units 10/24/2021    3:10 PM 06/20/2021    3:12 PM 04/13/2020   12:22 PM  CMP  Glucose 70 - 99 mg/dL 104   106   BUN 8 - 23 mg/dL 24   22   Creatinine 0.44 - 1.00 mg/dL 1.58   1.21   Sodium 135 - 145 mmol/L 141   140   Potassium 3.5 - 5.1 mmol/L 3.8   3.5   Chloride 98 - 111 mmol/L 110   105   CO2 22 - 32 mmol/L 25   25   Calcium 8.9 - 10.3 mg/dL 9.4   9.9   Total Protein 6.5 - 8.1 g/dL 7.4  7.6  8.1   Total Bilirubin 0.3 - 1.2 mg/dL 0.6  0.6  0.7   Alkaline Phos 38 - 126 U/L 110  125  113   AST 15 - 41 U/L '16  15  17    '$ ALT 0 - 44 U/L '14  18  19     '$ DIAGNOSTIC IMAGING:  I have independently reviewed the relevant imaging and discussed with the patient.   WRAP UP:  All questions were answered. The patient knows to call the clinic with any problems, questions or concerns.  Medical decision making: Low  Time spent on visit: I spent 15 minutes counseling the patient face to face. The total time spent in the appointment was 22 minutes and more than 50% was on counseling.  Harriett Rush, PA-C  11/15/2022 2:58 PM

## 2022-11-15 ENCOUNTER — Other Ambulatory Visit: Payer: PPO

## 2022-11-15 ENCOUNTER — Inpatient Hospital Stay: Payer: PPO | Attending: Physician Assistant | Admitting: Physician Assistant

## 2022-11-15 ENCOUNTER — Encounter: Payer: Self-pay | Admitting: Physician Assistant

## 2022-11-15 ENCOUNTER — Ambulatory Visit (HOSPITAL_COMMUNITY)
Admission: RE | Admit: 2022-11-15 | Discharge: 2022-11-15 | Disposition: A | Discharge status: Home / Self Care | Payer: PPO | Source: Ambulatory Visit | Attending: Internal Medicine | Admitting: Internal Medicine

## 2022-11-15 VITALS — BP 161/83 | HR 73 | Temp 99.0°F | Resp 18 | Wt 185.6 lb

## 2022-11-15 DIAGNOSIS — Z1231 Encounter for screening mammogram for malignant neoplasm of breast: Secondary | ICD-10-CM | POA: Insufficient documentation

## 2022-11-15 DIAGNOSIS — I480 Paroxysmal atrial fibrillation: Secondary | ICD-10-CM | POA: Diagnosis not present

## 2022-11-15 DIAGNOSIS — Z7901 Long term (current) use of anticoagulants: Secondary | ICD-10-CM | POA: Diagnosis not present

## 2022-11-15 DIAGNOSIS — N189 Chronic kidney disease, unspecified: Secondary | ICD-10-CM | POA: Insufficient documentation

## 2022-11-15 DIAGNOSIS — I129 Hypertensive chronic kidney disease with stage 1 through stage 4 chronic kidney disease, or unspecified chronic kidney disease: Secondary | ICD-10-CM | POA: Diagnosis not present

## 2022-11-15 DIAGNOSIS — Z79899 Other long term (current) drug therapy: Secondary | ICD-10-CM | POA: Insufficient documentation

## 2022-11-15 DIAGNOSIS — D72829 Elevated white blood cell count, unspecified: Secondary | ICD-10-CM | POA: Diagnosis not present

## 2022-11-15 NOTE — Patient Instructions (Signed)
[facility] **   You were seen today by Tarri Abernethy PA-C for your elevated white blood cells.   Your labs do not show any sign of genetic mutation or cancer as the cause of your high white blood cells. Your persistent (mild) elevation in your white blood cells is likely response to chronic disease and inflammation. You do not need any treatment for your high white blood cells, but we will continue to check these labs at least once a year. If you have any other concerns or extremely elevated blood counts before your next visit, please do not hesitate to reach out.  FOLLOW-UP APPOINTMENT: Office visit in 1 year, after labs  ** Thank you for trusting me with your healthcare!  I strive to provide all of my patients with quality care at each visit.  If you receive a survey for this visit, I would be so grateful to you for taking the time to provide feedback.  Thank you in advance!  ~ Amalee Olsen                   Dr. Derek Jack   &   Tarri Abernethy, PA-C   - - - - - - - - - - - - - - - - - -    Thank you for choosing [place] at [facility] to provide your oncology and hematology care.  To afford each patient quality time with our provider, please arrive at least 15 minutes before your scheduled appointment time.   If you have a lab appointment with the [facility] please come in thru the Main Entrance and check in at the main information desk.  You need to re-schedule your appointment should you arrive 10 or more minutes late.  We strive to give you quality time with our providers, and arriving late affects you and other patients whose appointments are after yours.  Also, if you no show three or more times for appointments you may be dismissed from the clinic at the providers discretion.     Again, thank you for choosing [facility]  Our hope is that these requests  will decrease the amount of time that you wait before being seen by our physicians.       _____________________________________________________________  Should you have questions after your visit to [facility], please contact our office at [phone] and follow the prompts.  Our office hours are 8:00 a.m. and 4:30 p.m. Monday - Friday.  Please note that voicemails left after 4:00 p.m. may not be returned until the following business day.  We are closed weekends and major holidays.  You do have access to a nurse 24-7, just call the main number to the clinic [phone] and do not press any options, hold on the line and a nurse will answer the phone.    For prescription refill requests, have your pharmacy contact our office and allow 72 hours.

## 2022-11-21 ENCOUNTER — Other Ambulatory Visit (HOSPITAL_COMMUNITY): Payer: Self-pay | Admitting: Internal Medicine

## 2022-11-21 ENCOUNTER — Encounter (HOSPITAL_COMMUNITY): Payer: Self-pay | Admitting: Internal Medicine

## 2022-11-21 DIAGNOSIS — R928 Other abnormal and inconclusive findings on diagnostic imaging of breast: Secondary | ICD-10-CM

## 2022-12-06 ENCOUNTER — Ambulatory Visit (HOSPITAL_COMMUNITY)
Admission: RE | Admit: 2022-12-06 | Discharge: 2022-12-06 | Disposition: A | Discharge status: Home / Self Care | Payer: PPO | Source: Ambulatory Visit | Attending: Internal Medicine | Admitting: Internal Medicine

## 2022-12-06 DIAGNOSIS — R928 Other abnormal and inconclusive findings on diagnostic imaging of breast: Secondary | ICD-10-CM

## 2022-12-06 DIAGNOSIS — R921 Mammographic calcification found on diagnostic imaging of breast: Secondary | ICD-10-CM | POA: Diagnosis not present

## 2023-01-09 ENCOUNTER — Other Ambulatory Visit (HOSPITAL_COMMUNITY): Payer: Self-pay | Admitting: Family Medicine

## 2023-01-09 ENCOUNTER — Encounter (HOSPITAL_COMMUNITY): Payer: Self-pay | Admitting: Family Medicine

## 2023-01-09 ENCOUNTER — Ambulatory Visit (HOSPITAL_COMMUNITY)
Admission: RE | Admit: 2023-01-09 | Discharge: 2023-01-09 | Disposition: A | Discharge status: Home / Self Care | Payer: PPO | Source: Ambulatory Visit | Attending: Family Medicine | Admitting: Family Medicine

## 2023-01-09 DIAGNOSIS — M7989 Other specified soft tissue disorders: Secondary | ICD-10-CM | POA: Diagnosis not present

## 2023-01-09 DIAGNOSIS — M25471 Effusion, right ankle: Secondary | ICD-10-CM | POA: Insufficient documentation

## 2023-01-16 ENCOUNTER — Other Ambulatory Visit: Payer: Self-pay | Admitting: Cardiology

## 2023-01-24 ENCOUNTER — Ambulatory Visit: Payer: PPO | Admitting: Orthopedic Surgery

## 2023-01-24 ENCOUNTER — Encounter: Payer: Self-pay | Admitting: Orthopedic Surgery

## 2023-01-24 VITALS — BP 194/72 | HR 76 | Ht 62.0 in | Wt 182.0 lb

## 2023-01-24 DIAGNOSIS — S92354A Nondisplaced fracture of fifth metatarsal bone, right foot, initial encounter for closed fracture: Secondary | ICD-10-CM | POA: Diagnosis not present

## 2023-01-24 NOTE — Progress Notes (Signed)
NEW   Chief Complaint  Patient presents with   Ankle Problem    Swelling ankle x 3weeks after injury/ improving now    Foot Pain    Right    Encounter Diagnosis  Name Primary?   Closed nondisplaced fracture of fifth metatarsal bone of right foot, initial encounter Yes   History this is a 77 year old female who stood up had some pain in her foot had x-rays which showed 1/5 metatarsal fracture it is not a Jones fracture it is a avulsion type fracture.  She is now walking unsupported no pain does have some swelling but is functionally normal.  Review of systems no numbness or tingling  Physical exam  Ortho Exam   Right foot  Gait normal  Tenderness and swelling proximal fifth metatarsal  Range of motion ankle and foot normal  Instability none  Muscle tone normal  Skin normal  I reviewed the images the images show a nondisplaced fracture of the proximal aspect of the fifth metatarsal it is not a Jones fracture  There are also ankle images which I reviewed and I do not see a fracture there  Impression  Encounter Diagnosis  Name Primary?   Closed nondisplaced fracture of fifth metatarsal bone of right foot, initial encounter Yes   Can resume normal activities follow-up as needed

## 2023-02-18 DIAGNOSIS — H40051 Ocular hypertension, right eye: Secondary | ICD-10-CM | POA: Diagnosis not present

## 2023-04-09 DIAGNOSIS — H401122 Primary open-angle glaucoma, left eye, moderate stage: Secondary | ICD-10-CM | POA: Diagnosis not present

## 2023-04-09 DIAGNOSIS — H40051 Ocular hypertension, right eye: Secondary | ICD-10-CM | POA: Diagnosis not present

## 2023-04-12 DIAGNOSIS — N1832 Chronic kidney disease, stage 3b: Secondary | ICD-10-CM | POA: Diagnosis not present

## 2023-04-12 DIAGNOSIS — E782 Mixed hyperlipidemia: Secondary | ICD-10-CM | POA: Diagnosis not present

## 2023-04-17 DIAGNOSIS — R7303 Prediabetes: Secondary | ICD-10-CM | POA: Diagnosis not present

## 2023-04-17 DIAGNOSIS — R7989 Other specified abnormal findings of blood chemistry: Secondary | ICD-10-CM | POA: Diagnosis not present

## 2023-04-17 DIAGNOSIS — E669 Obesity, unspecified: Secondary | ICD-10-CM | POA: Diagnosis not present

## 2023-04-17 DIAGNOSIS — I480 Paroxysmal atrial fibrillation: Secondary | ICD-10-CM | POA: Diagnosis not present

## 2023-04-17 DIAGNOSIS — N1832 Chronic kidney disease, stage 3b: Secondary | ICD-10-CM | POA: Diagnosis not present

## 2023-04-17 DIAGNOSIS — E782 Mixed hyperlipidemia: Secondary | ICD-10-CM | POA: Diagnosis not present

## 2023-04-17 DIAGNOSIS — N939 Abnormal uterine and vaginal bleeding, unspecified: Secondary | ICD-10-CM | POA: Diagnosis not present

## 2023-04-17 DIAGNOSIS — H269 Unspecified cataract: Secondary | ICD-10-CM | POA: Diagnosis not present

## 2023-04-17 DIAGNOSIS — D72829 Elevated white blood cell count, unspecified: Secondary | ICD-10-CM | POA: Diagnosis not present

## 2023-04-17 DIAGNOSIS — R809 Proteinuria, unspecified: Secondary | ICD-10-CM | POA: Diagnosis not present

## 2023-04-17 DIAGNOSIS — I129 Hypertensive chronic kidney disease with stage 1 through stage 4 chronic kidney disease, or unspecified chronic kidney disease: Secondary | ICD-10-CM | POA: Diagnosis not present

## 2023-04-17 DIAGNOSIS — Z0000 Encounter for general adult medical examination without abnormal findings: Secondary | ICD-10-CM | POA: Diagnosis not present

## 2023-04-29 NOTE — Patient Instructions (Signed)

## 2023-04-30 DIAGNOSIS — E038 Other specified hypothyroidism: Secondary | ICD-10-CM | POA: Diagnosis not present

## 2023-04-30 DIAGNOSIS — E063 Autoimmune thyroiditis: Secondary | ICD-10-CM | POA: Diagnosis not present

## 2023-05-01 LAB — TSH: TSH: 11.5 u[IU]/mL — ABNORMAL HIGH (ref 0.450–4.500)

## 2023-05-01 LAB — T4, FREE: Free T4: 1.42 ng/dL (ref 0.82–1.77)

## 2023-05-13 ENCOUNTER — Encounter: Payer: Self-pay | Admitting: Nurse Practitioner

## 2023-05-13 ENCOUNTER — Ambulatory Visit: Payer: PPO | Admitting: Nurse Practitioner

## 2023-05-13 VITALS — BP 142/70 | HR 62 | Ht 62.0 in | Wt 186.0 lb

## 2023-05-13 DIAGNOSIS — E063 Autoimmune thyroiditis: Secondary | ICD-10-CM | POA: Diagnosis not present

## 2023-05-13 DIAGNOSIS — E038 Other specified hypothyroidism: Secondary | ICD-10-CM

## 2023-05-13 MED ORDER — LEVOTHYROXINE SODIUM 25 MCG PO TABS: 25.0000 ug | ORAL_TABLET | Freq: Every day | ORAL | 3 refills | Status: DC

## 2023-05-13 NOTE — Progress Notes (Signed)
Endocrinology Follow Up Note                                         05/13/2023, 1:18 PM  Subjective:   Subjective    Vanessa Anderson is a 77 y.o.-year-old female patient being seen in follow up after being seen in consultation for hypothyroidism referred by Benita Stabile, MD.   Past Medical History:  Diagnosis Date   Chronic kidney disease    Essential hypertension    Fibromyalgia    Hyperlipidemia    Paroxysmal atrial fibrillation [facility])     Past Surgical History:  Procedure Laterality Date   CATARACT EXTRACTION Bilateral 01/2022   CESAREAN SECTION     COLONOSCOPY N/A 05/28/2014   Procedure: COLONOSCOPY;  Surgeon: West Bali, MD;  Location[facility] ENDO SU[facility];  Service: Endoscopy;  Laterality: N/A;  8:30 AM   HYSTEROSCOPY WITH D & C N/A 11/07/2016   Procedure: DILATATION AND CURETTAGE /HYSTEROSCOPY;  Surgeon: Lazaro Arms, MD;  Location: [facility] ORS;  Service: Gynecology;  Laterality: N/A;   TOOTH EXTRACTION  09/20/2022   3 wisdom teeth    Social History   Socioeconomic History   Marital status: Married    Spouse name: Not on file   Number of children: 2   Years of education: Not on file   Highest education level: Not on file  Occupational History   Not on file  Tobacco Use   Smoking status: Never   Smokeless tobacco: Never  Vaping Use   Vaping Use: Never used  Substance and Sexual Activity   Alcohol use: No    Alcohol/week: 0.0 standard drinks of alcohol   Drug use: No   Sexual activity: Not Currently  Other Topics Concern   Not on file  Social History Narrative   Not on file   Social Determinants of Health   Financial Resource Strain: Not on file  Food Insecurity: Not on file  Transportation Needs: Not on file  Physical Activity: Not on file  Stress: Not on file  Social Connections: Not on file    Family History  Problem Relation Age of Onset   Heart attack Father     Outpatient  Encounter Medications as of 05/13/2023  Medication Sig   amiodarone (PACERONE) 200 MG tablet TAKE ONE TABLET BY MOUTH EVERY MORNING   atorvastatin (LIPITOR) 10 MG tablet Take 10 mg by mouth daily at 6 PM.    Calcium 500-2.5 MG-MCG CHEW Chew 1 tablet by mouth every morning.   Calcium Carbonate-Vitamin D (CALCIUM 500/VITAMIN D PO) Take 600 mg by mouth.   cetirizine (ZYRTEC) 10 MG tablet Take 10 mg by mouth daily.   cholecalciferol (VITAMIN D3) 25 MCG (1000 UNIT) tablet Take 1,000 Units by mouth daily.   DILT-XR 120 MG 24 hr capsule Take 120 mg by mouth every morning.   ELIQUIS 5 MG TABS tablet TAKE 1 TABLET(5 MG) BY MOUTH TWICE DAILY   megestrol (MEGACE) 20 MG tablet Take 1 tablet twice  a day   olmesartan-hydrochlorothiazide (BENICAR HCT) 40-25 MG tablet Take 1 tablet by mouth every morning.   sodium bicarbonate 650 MG tablet Take 650 mg by mouth 2 (two) times daily.   [DISCONTINUED] levothyroxine (SYNTHROID) 25 MCG tablet Take 1 tablet (25 mcg total) by mouth daily before breakfast.   levothyroxine (SYNTHROID) 25 MCG tablet Take 1 tablet (25 mcg total) by mouth daily before breakfast.   No facility-administered encounter medications on file as of 05/13/2023.    ALLERGIES: Allergies  Allergen Reactions   Hydrocodone Itching   VACCINATION STATUS: Immunization History  Administered Date(s) Administered   Influenza-Unspecified 07/06/2018     HPI   Vanessa Anderson is a patient with the above medical history. It was incidentally found during recent blood work that her thyroid function was off.  She denies taking Biotin supplement.  She has never had any imaging of her thyroid in the past.  In review of her medical records, she is on Amiodarone which over time can cause thyroid dysfunction.  She has positive thyroid antibodies meaning she has autoimmune thyroid dysfunction.  I reviewed patient's thyroid tests:  Lab Results  Component Value Date   TSH 11.500 (H) 04/30/2023   TSH 9.620 (H)  11/08/2022   TSH 6.000 (H) 04/30/2022   TSH 10.600 (H) 12/12/2021   TSH 16.700 (H) 09/14/2021   TSH 14.501 (H) 08/17/2021   TSH 14.976 (H) 06/20/2021   TSH 3.534 11/26/2013   FREET4 1.42 04/30/2023   FREET4 1.50 11/08/2022   FREET4 1.60 04/30/2022   FREET4 1.32 12/12/2021   FREET4 1.13 09/14/2021     Pt does not have any specific symptoms of thyroid dysfunction.  Pt denies feeling nodules in neck, hoarseness, dysphagia/odynophagia, SOB with lying down.  she denies family history of thyroid disorders.  No family history of thyroid cancer.  No history of radiation therapy to head or neck.  No recent use of iodine supplements.  Denies use of Biotin containing supplements.    I reviewed her chart and she also has a history of Afib, CKD, HTN, HLD.   Review of systems  Constitutional: + Minimally fluctuating body weight,  current Body mass index is 34.02 kg/m. , no fatigue, no subjective hyperthermia, no subjective hypothermia Eyes: no blurry vision, no xerophthalmia ENT: no sore throat, no nodules palpated in throat, no dysphagia/odynophagia, no hoarseness Cardiovascular: no chest pain, no shortness of breath, no palpitations, no leg swelling Respiratory: no cough, no shortness of breath Gastrointestinal: no nausea/vomiting/diarrhea Musculoskeletal: no muscle/joint aches Skin: no rashes, no hyperemia Neurological: no tremors, no numbness, no tingling, no dizziness Psychiatric: no depression, no anxiety   Objective:   Objective     BP (!) 142/70 (BP Location: Right Arm, Patient Position: Sitting, Cuff Size: Large) Comment: Retake with manuel cuff  Pulse 62   Ht 5\' 2"  (1.575 m)   Wt 186 lb (84.4 kg)   BMI 34.02 kg/m  Wt Readings from Last 3 Encounters:  05/13/23 186 lb (84.4 kg)  01/24/23 182 lb (82.6 kg)  11/15/22 185 lb 9.6 oz (84.2 kg)    BP Readings from Last 3 Encounters:  05/13/23 (!) 142/70  01/24/23 (!) 194/72  11/15/22 (!) 161/83       Physical Exam-  Limited  Constitutional:  Body mass index is 34.02 kg/m. , not in acute distress, normal state of mind Eyes:  EOMI, no exophthalmos Musculoskeletal: no gross deformities, strength intact in all four extremities, no gross restriction of joint movements Skin:  no rashes, no hyperemia Neurological: no tremor with outstretched hands   CMP ( most recent) CMP     Component Value Date/Time   NA 141 10/24/2021 1510   K 3.8 10/24/2021 1510   CL 110 10/24/2021 1510   CO2 25 10/24/2021 1510   GLUCOSE 104 (H) 10/24/2021 1510   BUN 24 (H) 10/24/2021 1510   CREATININE 1.58 (H) 10/24/2021 1510   CALCIUM 9.4 10/24/2021 1510   PROT 7.4 10/24/2021 1510   ALBUMIN 4.2 10/24/2021 1510   AST 16 10/24/2021 1510   ALT 14 10/24/2021 1510   ALKPHOS 110 10/24/2021 1510   BILITOT 0.6 10/24/2021 1510   GFRNONAA 34 (L) 10/24/2021 1510   GFRAA 51 (L) 04/13/2020 1222     Diabetic Labs (most recent): No results found for: "HGBA1C", "MICROALBUR"   Lipid Panel ( most recent) Lipid Panel  No results found for: "CHOL", "TRIG", "HDL", "CHOLHDL", "VLDL", "LDLCALC", "LDLDIRECT", "LABVLDL"     Lab Results  Component Value Date   TSH 11.500 (H) 04/30/2023   TSH 9.620 (H) 11/08/2022   TSH 6.000 (H) 04/30/2022   TSH 10.600 (H) 12/12/2021   TSH 16.700 (H) 09/14/2021   TSH 14.501 (H) 08/17/2021   TSH 14.976 (H) 06/20/2021   TSH 3.534 11/26/2013   FREET4 1.42 04/30/2023   FREET4 1.50 11/08/2022   FREET4 1.60 04/30/2022   FREET4 1.32 12/12/2021   FREET4 1.13 09/14/2021      Latest Reference Range & Units 09/14/21 15:28 12/12/21 13:39 04/30/22 14:15 11/08/22 13:50 04/30/23 11:00  TSH 0.450 - 4.500 uIU/mL 16.700 (H) 10.600 (H) 6.000 (H) 9.620 (H) 11.500 (H)  Triiodothyronine,Free,Serum 2.0 - 4.4 pg/mL 2.9      T4,Free(Direct) 0.82 - 1.77 ng/dL 1.61 0.96 0.45 4.09 8.11  Thyroperoxidase Ab SerPl-aCnc 0 - 34 IU/mL >600 (H)      Thyroglobulin Antibody 0.0 - 0.9 IU/mL 1.2 (H)      (H): Data is abnormally  high  Assessment & Plan:   ASSESSMENT / PLAN:  1. Hypothyroidism-r/t Hashimotos thyroiditis  Her previsit thyroid function tests are consistent with appropriate hormone replacement.  TSH still elevated but Free T4 on upper end of normal (may be related to Amiodarone). She denies any symptoms of under-replacement.  She is advised to continue Levothyroxine 25 mcg po daily before breakfast.    - We discussed about correct intake of levothyroxine, at fasting, with water, separated by at least 30 minutes from breakfast, and separated by more than 4 hours from calcium, iron, multivitamins, acid reflux medications (PPIs). -Patient is made aware of the fact that thyroid hormone replacement is needed for life, dose to be adjusted by periodic monitoring of thyroid function tests.     I spent  12  minutes in the care of the patient today including review of labs from Thyroid Function, CMP, and other relevant labs ; imaging/biopsy records (current and previous including abstractions from other facilities); face-to-face time discussing  her lab results and symptoms, medications doses, her options of short and long term treatment based on the latest standards of care / guidelines;   and documenting the encounter.  Vanessa Anderson  participated in the discussions, expressed understanding, and voiced agreement with the above plans.  All questions were answered to her satisfaction. she is encouraged to contact clinic should she have any questions or concerns prior to her return visit.   FOLLOW UP PLAN:  Return in about 1 year (around 05/12/2024) for Thyroid follow up, Previsit labs.  Vanessa Anderson  Vanessa Anderson [facility] [address] [place], [place] [address] Phone: [phone] Fax: [phone]  05/13/2023, 1:18 PM

## 2023-05-24 DIAGNOSIS — H401122 Primary open-angle glaucoma, left eye, moderate stage: Secondary | ICD-10-CM | POA: Diagnosis not present

## 2023-05-24 DIAGNOSIS — H40051 Ocular hypertension, right eye: Secondary | ICD-10-CM | POA: Diagnosis not present

## 2023-06-10 DIAGNOSIS — R059 Cough, unspecified: Secondary | ICD-10-CM | POA: Diagnosis not present

## 2023-06-10 DIAGNOSIS — U071 COVID-19: Secondary | ICD-10-CM | POA: Diagnosis not present

## 2023-07-03 ENCOUNTER — Other Ambulatory Visit (HOSPITAL_COMMUNITY): Payer: Self-pay | Admitting: Internal Medicine

## 2023-07-03 DIAGNOSIS — Z13820 Encounter for screening for osteoporosis: Secondary | ICD-10-CM

## 2023-08-01 DIAGNOSIS — Z23 Encounter for immunization: Secondary | ICD-10-CM | POA: Diagnosis not present

## 2023-08-06 ENCOUNTER — Other Ambulatory Visit (HOSPITAL_COMMUNITY): Payer: Self-pay

## 2023-08-06 ENCOUNTER — Other Ambulatory Visit: Payer: Self-pay | Admitting: Nurse Practitioner

## 2023-08-06 ENCOUNTER — Telehealth: Payer: Self-pay | Admitting: Obstetrics & Gynecology

## 2023-08-06 ENCOUNTER — Other Ambulatory Visit: Payer: Self-pay

## 2023-08-06 ENCOUNTER — Other Ambulatory Visit: Payer: Self-pay | Admitting: Cardiology

## 2023-08-06 ENCOUNTER — Other Ambulatory Visit: Payer: Self-pay | Admitting: Obstetrics & Gynecology

## 2023-08-06 MED ORDER — LEVOTHYROXINE SODIUM 25 MCG PO TABS
25.0000 ug | ORAL_TABLET | Freq: Every day | ORAL | 3 refills | Status: DC
  Filled 2023-08-06: qty 90, 90d supply, fill #0
  Filled 2023-08-13: qty 30, 30d supply, fill #0
  Filled 2023-09-25 (×3): qty 30, 30d supply, fill #1
  Filled 2023-10-21 – 2023-10-22 (×2): qty 30, 30d supply, fill #2
  Filled 2023-11-08 – 2023-11-21 (×3): qty 30, 30d supply, fill #3
  Filled 2023-12-09 – 2023-12-17 (×3): qty 30, 30d supply, fill #4
  Filled 2023-12-30 – 2024-01-09 (×3): qty 30, 30d supply, fill #5
  Filled 2024-01-13 – 2024-02-06 (×3): qty 30, 30d supply, fill #6
  Filled 2024-03-24 – 2024-03-25 (×4): qty 30, 30d supply, fill #7
  Filled 2024-04-08 – 2024-04-20 (×5): qty 30, 30d supply, fill #8
  Filled ????-??-??: fill #1

## 2023-08-06 MED ORDER — APIXABAN 5 MG PO TABS
5.0000 mg | ORAL_TABLET | Freq: Two times a day (BID) | ORAL | 1 refills | Status: DC
  Filled 2023-08-06: qty 180, fill #0
  Filled 2023-08-13: qty 60, 30d supply, fill #0
  Filled 2023-09-25 (×2): qty 60, 30d supply, fill #1
  Filled ????-??-??: fill #1

## 2023-08-06 MED ORDER — SODIUM BICARBONATE 650 MG PO TABS
650.0000 mg | ORAL_TABLET | Freq: Two times a day (BID) | ORAL | 12 refills | Status: DC
  Filled 2023-08-06 – 2023-08-13 (×2): qty 60, 30d supply, fill #0
  Filled 2023-09-25 (×2): qty 60, 30d supply, fill #1
  Filled 2023-10-21 – 2023-10-22 (×2): qty 60, 30d supply, fill #2
  Filled 2023-11-08 – 2023-11-19 (×4): qty 60, 30d supply, fill #3
  Filled 2023-12-09 – 2023-12-17 (×3): qty 60, 30d supply, fill #4
  Filled 2023-12-30 – 2024-01-09 (×2): qty 60, 30d supply, fill #5
  Filled 2024-01-13 – 2024-02-05 (×3): qty 60, 30d supply, fill #6
  Filled 2024-03-24 (×3): qty 60, 30d supply, fill #7
  Filled 2024-04-08 – 2024-04-16 (×2): qty 60, 30d supply, fill #8
  Filled 2024-05-18: qty 60, 30d supply, fill #9
  Filled 2024-06-12: qty 60, 30d supply, fill #10
  Filled 2024-07-14: qty 60, 30d supply, fill #11
  Filled 2024-08-13: qty 60, 30d supply, fill #12
  Filled ????-??-??: fill #1

## 2023-08-06 NOTE — Telephone Encounter (Signed)
Prescription refill request for Eliquis received. Indication: PAF Last office visit: 10/03/22  Ival Bible MD Scr: 1.58 on 11/08/22  Epic Age: 77 Weight: 83.9kg  Based on above findings Eliquis 5mg  twice daily is the appropriate dose.  Refill approved.

## 2023-08-06 NOTE — Telephone Encounter (Signed)
Patient's daughter called to see if you could send in a refill for megestrol to [facility]. Please advise.

## 2023-08-07 ENCOUNTER — Other Ambulatory Visit (HOSPITAL_COMMUNITY): Payer: Self-pay

## 2023-08-07 MED ORDER — MEGESTROL ACETATE 20 MG PO TABS
20.0000 mg | ORAL_TABLET | Freq: Two times a day (BID) | ORAL | 5 refills | Status: DC
  Filled 2023-08-07 – 2023-08-19 (×2): qty 60, 30d supply, fill #0
  Filled ????-??-??: fill #1

## 2023-08-07 MED ORDER — ATORVASTATIN CALCIUM 10 MG PO TABS
10.0000 mg | ORAL_TABLET | Freq: Every morning | ORAL | 11 refills | Status: DC
  Filled 2023-08-07 – 2023-08-13 (×2): qty 30, 30d supply, fill #0
  Filled 2023-09-25 (×2): qty 30, 30d supply, fill #1
  Filled 2023-10-21 – 2023-10-22 (×2): qty 30, 30d supply, fill #2
  Filled 2023-11-08 – 2023-11-19 (×4): qty 30, 30d supply, fill #3
  Filled 2023-12-09 – 2023-12-17 (×3): qty 30, 30d supply, fill #4
  Filled 2023-12-30 – 2024-01-09 (×2): qty 30, 30d supply, fill #5
  Filled 2024-01-13 – 2024-02-05 (×3): qty 30, 30d supply, fill #6
  Filled 2024-03-24 (×3): qty 30, 30d supply, fill #7
  Filled 2024-04-08 – 2024-04-16 (×2): qty 30, 30d supply, fill #8
  Filled 2024-05-18: qty 30, 30d supply, fill #9
  Filled 2024-06-12: qty 30, 30d supply, fill #10
  Filled 2024-07-14: qty 30, 30d supply, fill #11
  Filled ????-??-??: fill #1

## 2023-08-07 MED ORDER — OLMESARTAN MEDOXOMIL-HCTZ 40-25 MG PO TABS
1.0000 | ORAL_TABLET | Freq: Every morning | ORAL | 1 refills | Status: DC
  Filled 2023-08-07: qty 90, 90d supply, fill #0
  Filled 2023-08-13: qty 30, 30d supply, fill #0
  Filled 2023-09-04 – 2023-09-25 (×3): qty 30, 30d supply, fill #1
  Filled 2023-10-21 – 2023-10-22 (×2): qty 30, 30d supply, fill #2
  Filled 2023-11-08 – 2023-11-19 (×4): qty 30, 30d supply, fill #3
  Filled 2023-12-09 – 2023-12-17 (×3): qty 30, 30d supply, fill #4
  Filled 2023-12-30 – 2024-01-09 (×2): qty 30, 30d supply, fill #5
  Filled ????-??-??: fill #1

## 2023-08-07 MED ORDER — CALCIUM 500-2.5 MG-MCG PO CHEW
1.0000 | CHEWABLE_TABLET | Freq: Every morning | ORAL | 5 refills | Status: DC
  Filled 2023-08-26 (×2): qty 30, 30d supply, fill #0
  Filled 2023-08-27: qty 60, 60d supply, fill #0

## 2023-08-07 MED ORDER — ELIQUIS 5 MG PO TABS
5.0000 mg | ORAL_TABLET | Freq: Two times a day (BID) | ORAL | 6 refills | Status: DC
  Filled 2023-08-07: qty 30, 15d supply, fill #0
  Filled 2023-10-21 – 2023-10-22 (×2): qty 60, 30d supply, fill #0
  Filled 2023-11-08 – 2023-11-19 (×4): qty 60, 30d supply, fill #1
  Filled 2023-12-09 – 2023-12-17 (×3): qty 60, 30d supply, fill #2

## 2023-08-07 MED ORDER — CETIRIZINE HCL 10 MG PO TABS
10.0000 mg | ORAL_TABLET | Freq: Every morning | ORAL | 5 refills | Status: DC
  Filled 2023-08-07 – 2023-08-13 (×2): qty 30, 30d supply, fill #0
  Filled 2023-09-25 (×2): qty 30, 30d supply, fill #1
  Filled 2023-10-21 – 2023-10-22 (×2): qty 30, 30d supply, fill #2
  Filled 2023-11-08 – 2023-11-19 (×4): qty 30, 30d supply, fill #3
  Filled 2023-12-09 – 2023-12-17 (×3): qty 30, 30d supply, fill #4
  Filled 2023-12-30 – 2024-01-09 (×2): qty 30, 30d supply, fill #5
  Filled ????-??-??: fill #1

## 2023-08-07 MED ORDER — DILTIAZEM HCL ER 120 MG PO CP24
120.0000 mg | ORAL_CAPSULE | Freq: Every day | ORAL | 1 refills | Status: DC
  Filled 2023-08-07: qty 90, 90d supply, fill #0
  Filled 2023-08-13: qty 30, 30d supply, fill #0
  Filled 2023-09-25 (×2): qty 30, 30d supply, fill #1
  Filled 2023-10-21 – 2023-10-22 (×2): qty 30, 30d supply, fill #2
  Filled 2023-11-08 – 2023-11-19 (×4): qty 30, 30d supply, fill #3
  Filled 2023-12-09 – 2023-12-17 (×3): qty 30, 30d supply, fill #4
  Filled 2023-12-30 – 2024-01-09 (×2): qty 30, 30d supply, fill #5
  Filled ????-??-??: fill #1

## 2023-08-07 NOTE — Telephone Encounter (Signed)
Returned call to patient's daughter.  Informed she has not been seen since 2020 so will be considered a new patient. Offered appt for tomorrow but patient is not able to make it. Next appt made for 10/14. States PCP has been filling Megace script.

## 2023-08-08 ENCOUNTER — Other Ambulatory Visit (HOSPITAL_COMMUNITY): Payer: Self-pay

## 2023-08-08 MED ORDER — BRIMONIDINE TARTRATE 0.2 % OP SOLN
1.0000 [drp] | Freq: Two times a day (BID) | OPHTHALMIC | 11 refills | Status: AC
  Filled 2023-08-08: qty 5, 25d supply, fill #0
  Filled 2023-08-13 – 2023-08-27 (×3): qty 5, 50d supply, fill #0

## 2023-08-09 ENCOUNTER — Other Ambulatory Visit: Payer: Self-pay

## 2023-08-09 ENCOUNTER — Other Ambulatory Visit (HOSPITAL_COMMUNITY): Payer: Self-pay

## 2023-08-13 ENCOUNTER — Other Ambulatory Visit (HOSPITAL_COMMUNITY): Payer: Self-pay

## 2023-08-13 ENCOUNTER — Other Ambulatory Visit: Payer: Self-pay

## 2023-08-15 ENCOUNTER — Other Ambulatory Visit: Payer: Self-pay

## 2023-08-15 ENCOUNTER — Other Ambulatory Visit (HOSPITAL_COMMUNITY): Payer: Self-pay

## 2023-08-16 ENCOUNTER — Other Ambulatory Visit (HOSPITAL_COMMUNITY): Payer: Self-pay

## 2023-08-19 ENCOUNTER — Encounter: Payer: Self-pay | Admitting: Obstetrics & Gynecology

## 2023-08-19 ENCOUNTER — Other Ambulatory Visit (HOSPITAL_COMMUNITY): Payer: Self-pay

## 2023-08-19 ENCOUNTER — Other Ambulatory Visit: Payer: Self-pay

## 2023-08-19 ENCOUNTER — Ambulatory Visit: Payer: PPO | Admitting: Obstetrics & Gynecology

## 2023-08-19 VITALS — BP 151/67 | HR 71 | Ht 62.0 in | Wt 194.5 lb

## 2023-08-19 DIAGNOSIS — N8502 Endometrial intraepithelial neoplasia [EIN]: Secondary | ICD-10-CM | POA: Diagnosis not present

## 2023-08-19 MED ORDER — MEGESTROL ACETATE 20 MG PO TABS
20.0000 mg | ORAL_TABLET | Freq: Two times a day (BID) | ORAL | 11 refills | Status: DC
  Filled 2023-08-19 – 2023-09-25 (×2): qty 60, 30d supply, fill #0
  Filled 2023-10-21: qty 60, 30d supply, fill #1
  Filled 2023-11-08 – 2023-11-21 (×3): qty 60, 30d supply, fill #2
  Filled 2023-12-09 – 2023-12-17 (×3): qty 60, 30d supply, fill #3
  Filled 2023-12-30 – 2024-01-09 (×2): qty 60, 30d supply, fill #4
  Filled 2024-01-13 – 2024-02-05 (×3): qty 60, 30d supply, fill #5
  Filled 2024-03-24 (×3): qty 60, 30d supply, fill #6

## 2023-08-19 NOTE — Progress Notes (Signed)
Follow up appointment for results: [facility] 2008  Chief Complaint  Patient presents with   Follow-up    On Megace    Blood pressure (!) 159/76, pulse 74, height 5\' 2"  (1.575 m), weight 194 lb 8 oz (88.2 kg).  No results found.  On megestrol 20 mg BID for management of CAH ^BID when she had bleeding on eliquis4 No bleeding at all on BID No other complaints  MEDS ordered this encounter: Meds ordered this encounter  Medications   megestrol (MEGACE) 20 MG tablet    Sig: Take 1 tablet twice a day    Dispense:  60 tablet    Refill:  11    Orders for this encounter: No orders of the defined types were placed in this encounter.   Impression + Management Plan   ICD-10-CM   1. Complex atypical endometrial hyperplasia: 2008 on megestrol 20 mg BID x 16 years  N85.02    no bleeding      Follow Up: Return in about 1 year (around 08/18/2024) for Follow up, with Dr Despina Hidden.     All questions were answered.  Past Medical History:  Diagnosis Date   Chronic kidney disease    Essential hypertension    Fibromyalgia    Hyperlipidemia    Paroxysmal atrial fibrillation [facility])     Past Surgical History:  Procedure Laterality Date   CATARACT EXTRACTION Bilateral 01/2022   CESAREAN SECTION     COLONOSCOPY N/A 05/28/2014   Procedure: COLONOSCOPY;  Surgeon: [place] Bali, MD;  Location: [facility] ENDO SUITE;  Service: Endoscopy;  Laterality: N/A;  8:30 AM   HYSTEROSCOPY WITH D & C N/A 11/07/2016   Procedure: DILATATION AND CURETTAGE /HYSTEROSCOPY;  Surgeon: Lazaro Arms, MD;  Location: [facility] ORS;  Service: Gynecology;  Laterality: N/A;   TOOTH EXTRACTION  09/20/2022   3 wisdom teeth    OB History     Gravida  2   Para      Term      Preterm      AB      Living         SAB      IAB      Ectopic      Multiple      Live Births              Allergies  Allergen Reactions   Hydrocodone Itching    Social History   Socioeconomic History   Marital status: Widowed     Spouse name: Not on file   Number of children: 2   Years of education: Not on file   Highest education level: Not on file  Occupational History   Not on file  Tobacco Use   Smoking status: Never   Smokeless tobacco: Never  Vaping Use   Vaping status: Never Used  Substance and Sexual Activity   Alcohol use: No    Alcohol/week: 0.0 standard drinks of alcohol   Drug use: No   Sexual activity: Not Currently    Birth control/protection: Post-menopausal  Other Topics Concern   Not on file  Social History Narrative   Not on file   Social Determinants of Health   Financial Resource Strain: Low Risk  (08/19/2023)   Overall Financial Resource Strain (CARDIA)    Difficulty of Paying Living Expenses: Not hard at all  Food Insecurity: No Food Insecurity (08/19/2023)   Hunger Vital Sign    Worried About Running Out of Food in the Last  Year: Never true    Ran Out of Food in the Last Year: Never true  Transportation Needs: No Transportation Needs (08/19/2023)   PRAPARE - Administrator, Civil Service (Medical): No    Lack of Transportation (Non-Medical): No  Physical Activity: Insufficiently Active (08/19/2023)   Exercise Vital Sign    Days of Exercise per Week: 3 days    Minutes of Exercise per Session: 10 min  Stress: No Stress Concern Present (08/19/2023)   [employer] of Occupational Health - Occupational Stress Questionnaire    Feeling of Stress : Not at all  Social Connections: Moderately Integrated (08/19/2023)   Social Connection and Isolation Panel [NHANES]    Frequency of Communication with Friends and Family: More than three times a week    Frequency of Social Gatherings with Friends and Family: More than three times a week    Attends Religious Services: More than 4 times per year    Active Member of [employer] or Organizations: Yes    Attends Banker Meetings: More than 4 times per year    Marital Status: Widowed    Family History  Problem  Relation Age of Onset   Heart attack Father

## 2023-08-22 ENCOUNTER — Other Ambulatory Visit (HOSPITAL_COMMUNITY): Payer: Self-pay

## 2023-08-26 ENCOUNTER — Other Ambulatory Visit (HOSPITAL_COMMUNITY): Payer: Self-pay

## 2023-08-26 ENCOUNTER — Other Ambulatory Visit: Payer: Self-pay | Admitting: Cardiology

## 2023-08-26 ENCOUNTER — Other Ambulatory Visit: Payer: Self-pay

## 2023-08-26 MED ORDER — AMIODARONE HCL 200 MG PO TABS
200.0000 mg | ORAL_TABLET | Freq: Every morning | ORAL | 3 refills | Status: DC
  Filled 2023-08-26 (×2): qty 90, 90d supply, fill #0
  Filled 2023-08-27: qty 20, 20d supply, fill #0
  Filled 2023-09-08: qty 20, 20d supply, fill #1
  Filled 2023-09-10: qty 8, 8d supply, fill #1
  Filled 2023-09-25 (×2): qty 30, 30d supply, fill #2
  Filled 2023-10-21 – 2023-10-22 (×2): qty 30, 30d supply, fill #3
  Filled 2023-10-28: qty 2, 2d supply, fill #4
  Filled 2023-11-08 – 2023-11-14 (×2): qty 2, 2d supply, fill #5
  Filled 2023-11-14 – 2023-11-19 (×2): qty 30, 30d supply, fill #5
  Filled 2023-12-09 – 2023-12-17 (×3): qty 30, 30d supply, fill #6
  Filled 2023-12-30 – 2024-01-09 (×2): qty 30, 30d supply, fill #7
  Filled 2024-01-13 – 2024-02-05 (×3): qty 30, 30d supply, fill #8
  Filled 2024-03-24 (×3): qty 30, 30d supply, fill #9
  Filled 2024-04-08 – 2024-04-16 (×2): qty 30, 30d supply, fill #10
  Filled 2024-05-18: qty 30, 30d supply, fill #11
  Filled 2024-06-12: qty 30, 30d supply, fill #12
  Filled 2024-07-14: qty 30, 30d supply, fill #13
  Filled ????-??-??: fill #1

## 2023-08-27 ENCOUNTER — Other Ambulatory Visit (HOSPITAL_COMMUNITY): Payer: Self-pay

## 2023-08-27 ENCOUNTER — Other Ambulatory Visit: Payer: Self-pay

## 2023-08-28 ENCOUNTER — Other Ambulatory Visit (HOSPITAL_COMMUNITY): Payer: Self-pay

## 2023-08-28 ENCOUNTER — Other Ambulatory Visit: Payer: Self-pay

## 2023-09-04 ENCOUNTER — Other Ambulatory Visit (HOSPITAL_COMMUNITY): Payer: Self-pay

## 2023-09-04 ENCOUNTER — Other Ambulatory Visit: Payer: Self-pay

## 2023-09-09 ENCOUNTER — Other Ambulatory Visit: Payer: Self-pay

## 2023-09-10 ENCOUNTER — Other Ambulatory Visit (HOSPITAL_COMMUNITY): Payer: Self-pay

## 2023-09-10 ENCOUNTER — Other Ambulatory Visit: Payer: Self-pay

## 2023-09-13 ENCOUNTER — Other Ambulatory Visit: Payer: Self-pay

## 2023-09-13 ENCOUNTER — Other Ambulatory Visit (HOSPITAL_COMMUNITY): Payer: Self-pay

## 2023-09-13 DIAGNOSIS — H401122 Primary open-angle glaucoma, left eye, moderate stage: Secondary | ICD-10-CM | POA: Diagnosis not present

## 2023-09-13 MED ORDER — LATANOPROST 0.005 % OP SOLN
1.0000 [drp] | Freq: Every evening | OPHTHALMIC | 11 refills | Status: DC
  Filled 2023-09-13: qty 5, 50d supply, fill #0
  Filled 2023-10-18 – 2023-10-21 (×2): qty 5, 50d supply, fill #1

## 2023-09-19 ENCOUNTER — Other Ambulatory Visit (HOSPITAL_COMMUNITY): Payer: Self-pay | Admitting: Internal Medicine

## 2023-09-19 DIAGNOSIS — Z1231 Encounter for screening mammogram for malignant neoplasm of breast: Secondary | ICD-10-CM

## 2023-09-25 ENCOUNTER — Other Ambulatory Visit: Payer: Self-pay

## 2023-09-25 ENCOUNTER — Other Ambulatory Visit (HOSPITAL_COMMUNITY): Payer: Self-pay

## 2023-09-30 ENCOUNTER — Encounter: Payer: Self-pay | Admitting: Cardiology

## 2023-09-30 ENCOUNTER — Ambulatory Visit: Payer: PPO | Attending: Cardiology | Admitting: Cardiology

## 2023-09-30 VITALS — BP 138/60 | HR 67 | Ht 62.0 in | Wt 192.0 lb

## 2023-09-30 DIAGNOSIS — I10 Essential (primary) hypertension: Secondary | ICD-10-CM

## 2023-09-30 DIAGNOSIS — I4819 Other persistent atrial fibrillation: Secondary | ICD-10-CM

## 2023-09-30 DIAGNOSIS — I480 Paroxysmal atrial fibrillation: Secondary | ICD-10-CM

## 2023-09-30 NOTE — Patient Instructions (Addendum)

## 2023-09-30 NOTE — Progress Notes (Signed)
    Cardiology Office Note  Date: 09/30/2023   ID: Vanessa Anderson, Vanessa Anderson 09/11/1946, MRN [id number]  History of Present Illness: Vanessa Anderson is a 77 y.o. female last seen in November 2023.  She is here with her daughter for follow-up visit.  Reports no palpitations, no increasing dyspnea on exertion or chest pain.  No dizziness or syncope.  I reviewed her medications.  Current cardiac regimen includes Eliquis, amiodarone, Lipitor, diltiazem CD, and Benicar HCT.  She does not report any spontaneous bleeding problems.  She continues to check blood pressure at home, systolics generally between 120 and 130.  I rechecked her blood pressure 138/60 in the left arm today.  I reviewed her ECG today which shows sinus rhythm with nonspecific T wave changes.  Physical Exam: VS:  BP 138/60 (BP Location: Left Arm)   Pulse 67   Ht 5\' 2"  (1.575 m)   Wt 192 lb (87.1 kg)   SpO2 95%   BMI 35.12 kg/m , BMI Body mass index is 35.12 kg/m.  Wt Readings from Last 3 Encounters:  09/30/23 192 lb (87.1 kg)  08/19/23 194 lb 8 oz (88.2 kg)  05/13/23 186 lb (84.4 kg)    General: Patient appears comfortable at rest. HEENT: Conjunctiva and lids normal. Neck: Supple, no elevated JVP or carotid bruits. Lungs: Clear to auscultation, nonlabored breathing at rest. Cardiac: Regular rate and rhythm, no S3 or significant systolic murmur. Extremities: No pitting edema.  ECG:  An ECG dated 10/03/2022 was personally reviewed today and demonstrated:  Sinus rhythm.  Labwork:  June 2024: TSH 11.5, free T4 1.42, hemoglobin 12.2, platelets 228, BUN 27, creatinine 1.78, potassium 4.7, AST 13, ALT 14, cholesterol 113, triglycerides 57, HDL 34, LDL 66, hemoglobin A1c 5.8%  Other Studies Reviewed Today:  No interval cardiac testing for review today.  Assessment and Plan:  1.  Persistent atrial fibrillation with CHA2DS2-VASc score 3.  Asymptomatic with no significant palpitations.  She is in sinus rhythm by ECG  today.  Continue Eliquis for stroke prophylaxis.  Also on amiodarone and Cardizem CD as before.  No changes were made today.  I reviewed her interval lab work.   2.  Primary hypertension.  Largely has whitecoat hypertension based on review of home blood pressure.  Continue current regimen including Benicar HCT.  3.  Hypothyroidism, on Synthroid with follow-up by Dr. Margo Aye.  Disposition:  Follow up  1 year, sooner if needed.  Signed, Jonelle Sidle, M.D., F.A.C.C. [facility]

## 2023-10-01 ENCOUNTER — Other Ambulatory Visit: Payer: Self-pay

## 2023-10-07 ENCOUNTER — Other Ambulatory Visit (HOSPITAL_COMMUNITY): Payer: Self-pay

## 2023-10-07 ENCOUNTER — Other Ambulatory Visit: Payer: Self-pay

## 2023-10-07 DIAGNOSIS — R300 Dysuria: Secondary | ICD-10-CM | POA: Diagnosis not present

## 2023-10-07 MED ORDER — CEPHALEXIN 500 MG PO CAPS: 500.0000 mg | ORAL_CAPSULE | Freq: Two times a day (BID) | ORAL | 0 refills | Status: DC

## 2023-10-16 ENCOUNTER — Other Ambulatory Visit: Payer: Self-pay

## 2023-10-16 DIAGNOSIS — N1832 Chronic kidney disease, stage 3b: Secondary | ICD-10-CM | POA: Diagnosis not present

## 2023-10-16 DIAGNOSIS — E782 Mixed hyperlipidemia: Secondary | ICD-10-CM | POA: Diagnosis not present

## 2023-10-18 ENCOUNTER — Other Ambulatory Visit (HOSPITAL_COMMUNITY): Payer: Self-pay

## 2023-10-21 ENCOUNTER — Other Ambulatory Visit (HOSPITAL_COMMUNITY): Payer: Self-pay

## 2023-10-21 ENCOUNTER — Other Ambulatory Visit: Payer: Self-pay

## 2023-10-22 ENCOUNTER — Other Ambulatory Visit: Payer: Self-pay

## 2023-10-22 ENCOUNTER — Other Ambulatory Visit (HOSPITAL_COMMUNITY): Payer: Self-pay

## 2023-10-24 ENCOUNTER — Other Ambulatory Visit: Payer: Self-pay

## 2023-10-24 ENCOUNTER — Other Ambulatory Visit (HOSPITAL_COMMUNITY): Payer: Self-pay

## 2023-10-24 MED ORDER — CALCIUM 500-2.5 MG-MCG PO CHEW
1.0000 | CHEWABLE_TABLET | Freq: Every morning | ORAL | 5 refills | Status: DC
  Filled 2023-10-24 – 2023-10-25 (×2): qty 30, 30d supply, fill #0
  Filled 2023-11-08 – 2023-11-19 (×4): qty 30, 30d supply, fill #1
  Filled 2023-11-22: qty 30, 30d supply, fill #2
  Filled 2023-12-09 – 2023-12-17 (×3): qty 30, 30d supply, fill #3
  Filled 2023-12-27: qty 30, 30d supply, fill #4
  Filled 2024-01-16: qty 30, 30d supply, fill #5

## 2023-10-25 ENCOUNTER — Other Ambulatory Visit: Payer: Self-pay

## 2023-10-25 ENCOUNTER — Other Ambulatory Visit (HOSPITAL_COMMUNITY): Payer: Self-pay

## 2023-10-28 ENCOUNTER — Other Ambulatory Visit (HOSPITAL_COMMUNITY): Payer: Self-pay

## 2023-10-29 ENCOUNTER — Other Ambulatory Visit (HOSPITAL_COMMUNITY): Payer: Self-pay

## 2023-11-08 ENCOUNTER — Other Ambulatory Visit: Payer: Self-pay

## 2023-11-11 ENCOUNTER — Other Ambulatory Visit: Payer: Self-pay

## 2023-11-12 ENCOUNTER — Other Ambulatory Visit (HOSPITAL_COMMUNITY): Payer: Self-pay

## 2023-11-13 ENCOUNTER — Other Ambulatory Visit: Payer: Self-pay

## 2023-11-14 ENCOUNTER — Other Ambulatory Visit: Payer: Self-pay

## 2023-11-14 ENCOUNTER — Ambulatory Visit: Payer: PPO | Admitting: Oncology

## 2023-11-18 ENCOUNTER — Ambulatory Visit (HOSPITAL_COMMUNITY): Payer: PPO

## 2023-11-18 ENCOUNTER — Other Ambulatory Visit (HOSPITAL_COMMUNITY): Payer: PPO

## 2023-11-19 ENCOUNTER — Other Ambulatory Visit: Payer: Self-pay | Admitting: *Deleted

## 2023-11-19 ENCOUNTER — Other Ambulatory Visit: Payer: Self-pay

## 2023-11-19 DIAGNOSIS — D72829 Elevated white blood cell count, unspecified: Secondary | ICD-10-CM

## 2023-11-21 ENCOUNTER — Other Ambulatory Visit (HOSPITAL_COMMUNITY): Payer: Self-pay

## 2023-11-21 ENCOUNTER — Other Ambulatory Visit: Payer: Self-pay

## 2023-11-22 ENCOUNTER — Other Ambulatory Visit: Payer: Self-pay

## 2023-11-22 ENCOUNTER — Ambulatory Visit (HOSPITAL_COMMUNITY)
Admission: RE | Admit: 2023-11-22 | Discharge: 2023-11-22 | Disposition: A | Discharge status: Home / Self Care | Payer: PPO | Source: Ambulatory Visit | Attending: Internal Medicine | Admitting: Internal Medicine

## 2023-11-22 ENCOUNTER — Encounter (HOSPITAL_COMMUNITY): Payer: Self-pay

## 2023-11-22 ENCOUNTER — Other Ambulatory Visit (HOSPITAL_COMMUNITY): Payer: Self-pay

## 2023-11-22 ENCOUNTER — Inpatient Hospital Stay: Payer: PPO | Admitting: Oncology

## 2023-11-22 DIAGNOSIS — M85851 Other specified disorders of bone density and structure, right thigh: Secondary | ICD-10-CM | POA: Diagnosis not present

## 2023-11-22 DIAGNOSIS — Z13820 Encounter for screening for osteoporosis: Secondary | ICD-10-CM | POA: Diagnosis not present

## 2023-11-22 DIAGNOSIS — Z23 Encounter for immunization: Secondary | ICD-10-CM | POA: Diagnosis not present

## 2023-11-22 DIAGNOSIS — N1832 Chronic kidney disease, stage 3b: Secondary | ICD-10-CM | POA: Diagnosis not present

## 2023-11-22 DIAGNOSIS — Z1231 Encounter for screening mammogram for malignant neoplasm of breast: Secondary | ICD-10-CM | POA: Diagnosis not present

## 2023-11-22 DIAGNOSIS — D72829 Elevated white blood cell count, unspecified: Secondary | ICD-10-CM | POA: Diagnosis not present

## 2023-11-22 DIAGNOSIS — Z780 Asymptomatic menopausal state: Secondary | ICD-10-CM | POA: Insufficient documentation

## 2023-11-25 ENCOUNTER — Other Ambulatory Visit: Payer: Self-pay

## 2023-12-02 ENCOUNTER — Other Ambulatory Visit (HOSPITAL_COMMUNITY): Payer: Self-pay | Admitting: Ophthalmology

## 2023-12-02 ENCOUNTER — Other Ambulatory Visit (HOSPITAL_COMMUNITY): Payer: Self-pay

## 2023-12-04 ENCOUNTER — Other Ambulatory Visit (HOSPITAL_COMMUNITY): Payer: Self-pay

## 2023-12-06 ENCOUNTER — Inpatient Hospital Stay: Payer: PPO | Attending: Oncology | Admitting: Oncology

## 2023-12-06 VITALS — BP 161/61 | HR 62 | Temp 99.1°F | Resp 16 | Wt 194.9 lb

## 2023-12-06 DIAGNOSIS — Z7901 Long term (current) use of anticoagulants: Secondary | ICD-10-CM | POA: Diagnosis not present

## 2023-12-06 DIAGNOSIS — I480 Paroxysmal atrial fibrillation: Secondary | ICD-10-CM | POA: Insufficient documentation

## 2023-12-06 DIAGNOSIS — D72829 Elevated white blood cell count, unspecified: Secondary | ICD-10-CM | POA: Insufficient documentation

## 2023-12-06 NOTE — Progress Notes (Signed)
[facility] [address] [place]   CLINIC:  Medical Oncology/Hematology  PCP:  Vanessa Stabile, MD [address] [place] [address] [phone]   REASON FOR VISIT:  Follow-up for leukocytosis  PRIOR THERAPY: None  CURRENT THERAPY: Surveillance  INTERVAL HISTORY:   Vanessa Anderson 78 y.o. female returns for routine follow-up of her leukocytosis.  She was last seen by Vanessa Brenner, PA on 11/15/2022.  In the interim, she was seen by cardiology on 09/30/2023 for follow-up for atrial fibrillation on Eliquis.  She is also on amiodarone and Cardizem.  She met with GYN on 08/19/2023 for vaginal bleeding and was started on Megace.  This appears to be improving.  At today's visit, she reports feeling well.  No recent hospitalizations, surgeries, or changes in baseline Anderson status.  She denies any recent infections or antibiotics.  She does not take any steroid medications.  She is a lifelong non-smoker.  She denies any unexplained fever, chills, night sweats, or weight loss.  She has not noticed any new lumps or bumps.  She has 100% energy and 100% appetite. She endorses that she is maintaining a stable weight.   ASSESSMENT & PLAN:  1.  Leukocytosis - Persistent leukocytosis since 2017 - Hematology workup negative for MPN (negative JAK2, CALR, MPL, BCR/ABL FISH) - Lifelong non-smoker.  No history of splenectomy, connective tissue disorders, or autoimmune/inflammatory conditions.  No steroid use.  No family history of malignancies. - No B symptoms or recurrent infections. - Most recent labs from 11/22/2023 show white blood cell count of 12.9 (10.6) ANC 10 (7.4) otherwise normal CBC.  LDH is normal.   -Labs from 11/22/2023 from [facility] show white blood cell count of 12.9, ANC of 10.  Otherwise normal CBC.  LDH is normal. - No palpable adenopathy or splenomegaly. - She takes Eliquis for atrial fibrillation -Overall labs are stable. -We discussed  discharge from clinic given stability of her lab work. -Patient may return to clinic at any time within the next 2 years for follow-up if she has any concerns   REVIEW OF SYSTEMS:   Review of Systems  All other systems reviewed and are negative.    PHYSICAL EXAM:  ECOG PERFORMANCE STATUS: 0 - Asymptomatic  There were no vitals filed for this visit. There were no vitals filed for this visit. Physical Exam Constitutional:      Appearance: Normal appearance.  Cardiovascular:     Rate and Rhythm: Normal rate and regular rhythm.  Pulmonary:     Effort: Pulmonary effort is normal.     Breath sounds: Normal breath sounds.  Abdominal:     General: Bowel sounds are normal.     Palpations: Abdomen is soft.  Musculoskeletal:        General: No swelling. Normal range of motion.  Neurological:     Mental Status: She is alert and oriented to person, place, and time. Mental status is at baseline.     PAST MEDICAL/SURGICAL HISTORY:  Past Medical History:  Diagnosis Date   Chronic kidney disease    Essential hypertension    Fibromyalgia    Hyperlipidemia    Paroxysmal atrial fibrillation [place])    Past Surgical History:  Procedure Laterality Date   CATARACT EXTRACTION Bilateral 01/2022   CESAREAN SECTION     COLONOSCOPY N/A 05/28/2014   Procedure: COLONOSCOPY;  Surgeon: [place] Bali, MD;  Location: [facility];  Service: Endoscopy;  Laterality: N/A;  8:30  AM   HYSTEROSCOPY WITH D & C N/A 11/07/2016   Procedure: DILATATION AND CURETTAGE /HYSTEROSCOPY;  Surgeon: [place] Arms, MD;  Location: [facility] ORS;  Service: Gynecology;  Laterality: N/A;   TOOTH EXTRACTION  09/20/2022   3 wisdom teeth    SOCIAL HISTORY:  Social History   Socioeconomic History   Marital status: Widowed    Spouse name: Not on file   Number of children: 2   Years of education: Not on file   Highest education level: Not on file  Occupational History   Not on file  Tobacco Use   Smoking status: Never    Smokeless tobacco: Never  Vaping Use   Vaping status: Never Used  Substance and Sexual Activity   Alcohol use: No    Alcohol/week: 0.0 standard drinks of alcohol   Drug use: No   Sexual activity: Not Currently    Birth control/protection: Post-menopausal  Other Topics Concern   Not on file  Social History Narrative   Not on file   Social Drivers of Anderson   Anderson Resource Strain: Low Risk  (08/19/2023)   Overall Anderson Resource Strain (CARDIA)    Difficulty of Paying Living Expenses: Not hard at all  Food Insecurity: No Food Insecurity (08/19/2023)   Hunger Vital Sign    Worried About Running Out of Food in the Last Year: Never true    Ran Out of Food in the Last Year: Never true  Transportation Needs: No Transportation Needs (08/19/2023)   PRAPARE - Administrator, Civil Service (Medical): No    Lack of Transportation (Non-Medical): No  Physical Activity: Insufficiently Active (08/19/2023)   Exercise Vital Sign    Days of Exercise per Week: 3 days    Minutes of Exercise per Session: 10 min  Stress: No Stress Concern Present (08/19/2023)   [place] Anderson - Occupational Stress Questionnaire    Feeling of Stress : Not at all  Social Connections: Moderately Integrated (08/19/2023)   Social Connection and Isolation Panel [NHANES]    Frequency of Communication with Friends and Family: More than three times a week    Frequency of Social Gatherings with Friends and Family: More than three times a week    Attends Religious Services: More than 4 times per year    Active Member of [place] Anderson or Organizations: Yes    Attends Banker Meetings: More than 4 times per year    Marital Status: Widowed  Intimate Partner Violence: Not At Risk (08/19/2023)   Humiliation, Afraid, Rape, and Kick questionnaire    Fear of Current or Ex-Partner: No    Emotionally Abused: No    Physically Abused: No    Sexually Abused: No    FAMILY HISTORY:   Family History  Problem Relation Age of Onset   Heart attack Father     CURRENT MEDICATIONS:  Outpatient Encounter Medications as of 12/06/2023  Medication Sig   amiodarone (PACERONE) 200 MG tablet Take 1 tablet (200 mg total) by mouth every morning.   apixaban (ELIQUIS) 5 MG TABS tablet Take 1 tablet (5 mg total) by mouth 2 (two) times daily.   atorvastatin (LIPITOR) 10 MG tablet Take 1 tablet (10 mg total) by mouth every morning.   brimonidine (ALPHAGAN) 0.2 % ophthalmic solution Place 1 drop into both eyes 2 (two) times daily.   Calcium 500-2.5 MG-MCG CHEW Chew 1 tablet by mouth every morning.   Calcium 500-2.5 MG-MCG CHEW Chew 1 tablet by  mouth in the morning.   Calcium Carbonate-Vitamin D (CALCIUM 500/VITAMIN D PO) Take 600 mg by mouth.   cephALEXin (KEFLEX) 500 MG capsule Take 1 capsule (500 mg total) by mouth every 12 (twelve) hours.   cetirizine (ALLERGY RELIEF CETIRIZINE) 10 MG tablet Take 1 tablet (10 mg total) by mouth every morning.   cholecalciferol (VITAMIN D3) 25 MCG (1000 UNIT) tablet Take 1,000 Units by mouth daily.   diltiazem (DILT-XR) 120 MG 24 hr capsule Take 1 capsule (120 mg total) by mouth daily.   latanoprost (XALATAN) 0.005 % ophthalmic solution Place 1 drop into both eyes every evening.   levothyroxine (SYNTHROID) 25 MCG tablet Take 1 tablet (25 mcg total) by mouth daily before breakfast.   megestrol (MEGACE) 20 MG tablet Take 1 tablet (20 mg total) by mouth 2 (two) times daily.   olmesartan-hydrochlorothiazide (BENICAR HCT) 40-25 MG tablet Take 1 tablet by mouth every morning.   sodium bicarbonate 650 MG tablet Take 1 tablet (650 mg total) by mouth 2 (two) times daily.   No facility-administered encounter medications on file as of 12/06/2023.    ALLERGIES:  Allergies  Allergen Reactions   Hydrocodone Itching    LABORATORY DATA:  I have reviewed the labs as listed.  CBC    Component Value Date/Time   WBC 12.7 (H) 10/24/2021 1510   RBC 4.31 10/24/2021  1510   HGB 12.7 10/24/2021 1510   HCT 40.8 10/24/2021 1510   PLT 260 10/24/2021 1510   MCV 94.7 10/24/2021 1510   MCH 29.5 10/24/2021 1510   MCHC 31.1 10/24/2021 1510   RDW 14.0 10/24/2021 1510   LYMPHSABS 2.2 10/24/2021 1510   MONOABS 0.9 10/24/2021 1510   EOSABS 0.1 10/24/2021 1510   BASOSABS 0.1 10/24/2021 1510      Latest Ref Rng & Units 10/24/2021    3:10 PM 06/20/2021    3:12 PM 04/13/2020   12:22 PM  CMP  Glucose 70 - 99 mg/dL 409   811   BUN 8 - 23 mg/dL 24   22   Creatinine 9.14 - 1.00 mg/dL 7.82   9.56   Sodium 213 - 145 mmol/L 141   140   Potassium 3.5 - 5.1 mmol/L 3.8   3.5   Chloride 98 - 111 mmol/L 110   105   CO2 22 - 32 mmol/L 25   25   Calcium 8.9 - 10.3 mg/dL 9.4   9.9   Total Protein 6.5 - 8.1 g/dL 7.4  7.6  8.1   Total Bilirubin 0.3 - 1.2 mg/dL 0.6  0.6  0.7   Alkaline Phos 38 - 126 U/L 110  125  113   AST 15 - 41 U/L 16  15  17    ALT 0 - 44 U/L 14  18  19      DIAGNOSTIC IMAGING:  I have independently reviewed the relevant imaging and discussed with the patient.   WRAP UP:  All questions were answered. The patient knows to call the clinic with any problems, questions or concerns.  Medical decision making: Low  Time spent on visit: I spent 15 minutes counseling the patient face to face. The total time spent in the appointment was 22 minutes and more than 50% was on counseling.  Mauro Kaufmann, NP  11/15/2022 2:58 PM

## 2023-12-09 ENCOUNTER — Other Ambulatory Visit: Payer: Self-pay

## 2023-12-12 ENCOUNTER — Other Ambulatory Visit: Payer: Self-pay

## 2023-12-17 ENCOUNTER — Other Ambulatory Visit: Payer: Self-pay

## 2023-12-23 DIAGNOSIS — I129 Hypertensive chronic kidney disease with stage 1 through stage 4 chronic kidney disease, or unspecified chronic kidney disease: Secondary | ICD-10-CM | POA: Diagnosis not present

## 2023-12-23 DIAGNOSIS — E872 Acidosis, unspecified: Secondary | ICD-10-CM | POA: Diagnosis not present

## 2023-12-23 DIAGNOSIS — N1832 Chronic kidney disease, stage 3b: Secondary | ICD-10-CM | POA: Diagnosis not present

## 2023-12-24 ENCOUNTER — Other Ambulatory Visit (HOSPITAL_COMMUNITY): Payer: Self-pay

## 2023-12-24 MED ORDER — LATANOPROST 0.005 % OP SOLN
1.0000 [drp] | Freq: Every evening | OPHTHALMIC | 11 refills | Status: AC
  Filled 2023-12-24: qty 5, 50d supply, fill #0
  Filled 2024-01-02: qty 5, 100d supply, fill #0

## 2023-12-27 ENCOUNTER — Other Ambulatory Visit (HOSPITAL_COMMUNITY): Payer: Self-pay

## 2023-12-27 ENCOUNTER — Other Ambulatory Visit: Payer: Self-pay

## 2023-12-30 ENCOUNTER — Other Ambulatory Visit (HOSPITAL_COMMUNITY): Payer: Self-pay

## 2023-12-30 ENCOUNTER — Other Ambulatory Visit: Payer: Self-pay

## 2023-12-30 MED ORDER — ELIQUIS 5 MG PO TABS
5.0000 mg | ORAL_TABLET | Freq: Two times a day (BID) | ORAL | 3 refills | Status: DC
Start: 2023-12-30 — End: 2024-05-19
  Filled 2023-12-30 – 2024-01-09 (×2): qty 60, 30d supply, fill #0
  Filled 2024-01-13 – 2024-02-05 (×3): qty 60, 30d supply, fill #1
  Filled 2024-03-24 (×3): qty 60, 30d supply, fill #2
  Filled 2024-04-08 – 2024-04-16 (×2): qty 60, 30d supply, fill #3

## 2024-01-02 ENCOUNTER — Other Ambulatory Visit (HOSPITAL_COMMUNITY): Payer: Self-pay

## 2024-01-09 ENCOUNTER — Other Ambulatory Visit: Payer: Self-pay

## 2024-01-10 ENCOUNTER — Other Ambulatory Visit: Payer: Self-pay

## 2024-01-10 DIAGNOSIS — H401122 Primary open-angle glaucoma, left eye, moderate stage: Secondary | ICD-10-CM | POA: Diagnosis not present

## 2024-01-13 ENCOUNTER — Other Ambulatory Visit (HOSPITAL_COMMUNITY): Payer: Self-pay

## 2024-01-13 ENCOUNTER — Other Ambulatory Visit: Payer: Self-pay

## 2024-01-13 MED ORDER — OLMESARTAN MEDOXOMIL-HCTZ 40-25 MG PO TABS
1.0000 | ORAL_TABLET | Freq: Every morning | ORAL | 2 refills | Status: DC
  Filled 2024-02-27 – 2024-02-28 (×2): qty 30, 30d supply, fill #0
  Filled 2024-03-24 (×3): qty 30, 30d supply, fill #1
  Filled 2024-04-08 – 2024-04-16 (×2): qty 30, 30d supply, fill #2
  Filled 2024-05-18: qty 30, 30d supply, fill #3
  Filled 2024-06-12: qty 30, 30d supply, fill #4
  Filled 2024-07-14: qty 30, 30d supply, fill #5
  Filled 2024-08-13 (×2): qty 30, 30d supply, fill #6
  Filled 2024-09-14: qty 30, 30d supply, fill #7
  Filled 2024-10-12: qty 30, 30d supply, fill #8

## 2024-01-13 MED ORDER — DILTIAZEM HCL ER 120 MG PO CP24
120.0000 mg | ORAL_CAPSULE | Freq: Every day | ORAL | 2 refills | Status: DC
  Filled 2024-02-27 – 2024-02-28 (×2): qty 30, 30d supply, fill #0
  Filled 2024-03-24 (×3): qty 30, 30d supply, fill #1
  Filled 2024-04-08 – 2024-04-16 (×2): qty 30, 30d supply, fill #2
  Filled 2024-05-18: qty 30, 30d supply, fill #3
  Filled 2024-06-12: qty 30, 30d supply, fill #4
  Filled 2024-07-14: qty 30, 30d supply, fill #5
  Filled 2024-08-13 (×2): qty 30, 30d supply, fill #6
  Filled 2024-09-14: qty 30, 30d supply, fill #7
  Filled 2024-10-12: qty 30, 30d supply, fill #8

## 2024-01-13 MED ORDER — CETIRIZINE HCL 10 MG PO TABS
10.0000 mg | ORAL_TABLET | Freq: Every morning | ORAL | 5 refills | Status: DC
  Filled 2024-02-27 – 2024-02-28 (×2): qty 30, 30d supply, fill #0
  Filled 2024-03-24 (×3): qty 30, 30d supply, fill #1
  Filled 2024-04-08 – 2024-04-16 (×2): qty 30, 30d supply, fill #2
  Filled 2024-05-18: qty 30, 30d supply, fill #3
  Filled 2024-06-12: qty 30, 30d supply, fill #4
  Filled 2024-07-14: qty 30, 30d supply, fill #5

## 2024-01-14 ENCOUNTER — Other Ambulatory Visit (HOSPITAL_COMMUNITY): Payer: Self-pay

## 2024-01-15 ENCOUNTER — Other Ambulatory Visit (HOSPITAL_COMMUNITY): Payer: Self-pay

## 2024-01-16 ENCOUNTER — Other Ambulatory Visit (HOSPITAL_COMMUNITY): Payer: Self-pay

## 2024-01-16 ENCOUNTER — Other Ambulatory Visit: Payer: Self-pay

## 2024-02-04 ENCOUNTER — Other Ambulatory Visit: Payer: Self-pay

## 2024-02-05 ENCOUNTER — Other Ambulatory Visit: Payer: Self-pay

## 2024-02-05 ENCOUNTER — Other Ambulatory Visit (HOSPITAL_COMMUNITY): Payer: Self-pay

## 2024-02-06 ENCOUNTER — Other Ambulatory Visit: Payer: Self-pay

## 2024-02-20 DIAGNOSIS — N1832 Chronic kidney disease, stage 3b: Secondary | ICD-10-CM | POA: Diagnosis not present

## 2024-02-20 DIAGNOSIS — E782 Mixed hyperlipidemia: Secondary | ICD-10-CM | POA: Diagnosis not present

## 2024-02-21 LAB — LAB REPORT - SCANNED
A1c: 5.8
Albumin, Urine POC: 121.5
Creatinine, POC: 216.3 mg/dL
EGFR: 32
Microalb Creat Ratio: 56

## 2024-02-24 ENCOUNTER — Other Ambulatory Visit: Payer: Self-pay

## 2024-02-24 DIAGNOSIS — R809 Proteinuria, unspecified: Secondary | ICD-10-CM | POA: Diagnosis not present

## 2024-02-24 DIAGNOSIS — R7303 Prediabetes: Secondary | ICD-10-CM | POA: Diagnosis not present

## 2024-02-24 DIAGNOSIS — D72829 Elevated white blood cell count, unspecified: Secondary | ICD-10-CM | POA: Diagnosis not present

## 2024-02-24 DIAGNOSIS — I480 Paroxysmal atrial fibrillation: Secondary | ICD-10-CM | POA: Diagnosis not present

## 2024-02-24 DIAGNOSIS — N1832 Chronic kidney disease, stage 3b: Secondary | ICD-10-CM | POA: Diagnosis not present

## 2024-02-24 DIAGNOSIS — M8580 Other specified disorders of bone density and structure, unspecified site: Secondary | ICD-10-CM | POA: Diagnosis not present

## 2024-02-24 DIAGNOSIS — E782 Mixed hyperlipidemia: Secondary | ICD-10-CM | POA: Diagnosis not present

## 2024-02-24 DIAGNOSIS — R252 Cramp and spasm: Secondary | ICD-10-CM | POA: Diagnosis not present

## 2024-02-24 DIAGNOSIS — R748 Abnormal levels of other serum enzymes: Secondary | ICD-10-CM | POA: Diagnosis not present

## 2024-02-24 DIAGNOSIS — I129 Hypertensive chronic kidney disease with stage 1 through stage 4 chronic kidney disease, or unspecified chronic kidney disease: Secondary | ICD-10-CM | POA: Diagnosis not present

## 2024-02-24 DIAGNOSIS — Z0001 Encounter for general adult medical examination with abnormal findings: Secondary | ICD-10-CM | POA: Diagnosis not present

## 2024-02-25 ENCOUNTER — Other Ambulatory Visit (HOSPITAL_COMMUNITY): Payer: Self-pay

## 2024-02-25 ENCOUNTER — Other Ambulatory Visit: Payer: Self-pay

## 2024-02-25 MED ORDER — CALCIUM 500-2.5 MG-MCG PO CHEW
CHEWABLE_TABLET | ORAL | 5 refills | Status: DC
  Filled 2024-02-25: qty 60, 60d supply, fill #0
  Filled 2024-02-27 – 2024-02-28 (×2): qty 30, 30d supply, fill #0
  Filled 2024-03-24 (×2): qty 30, 30d supply, fill #1
  Filled 2024-04-08 – 2024-04-16 (×2): qty 30, 30d supply, fill #2
  Filled 2024-05-18: qty 30, 30d supply, fill #3
  Filled 2024-06-12: qty 30, 30d supply, fill #4
  Filled 2024-07-14: qty 30, 30d supply, fill #5

## 2024-02-26 ENCOUNTER — Other Ambulatory Visit: Payer: Self-pay

## 2024-02-27 ENCOUNTER — Other Ambulatory Visit (HOSPITAL_COMMUNITY): Payer: Self-pay

## 2024-02-27 ENCOUNTER — Other Ambulatory Visit: Payer: Self-pay

## 2024-02-28 ENCOUNTER — Other Ambulatory Visit (HOSPITAL_COMMUNITY): Payer: Self-pay

## 2024-02-28 ENCOUNTER — Other Ambulatory Visit: Payer: Self-pay

## 2024-02-28 ENCOUNTER — Other Ambulatory Visit (HOSPITAL_BASED_OUTPATIENT_CLINIC_OR_DEPARTMENT_OTHER): Payer: Self-pay

## 2024-03-23 ENCOUNTER — Other Ambulatory Visit (HOSPITAL_COMMUNITY): Payer: Self-pay

## 2024-03-24 ENCOUNTER — Other Ambulatory Visit: Payer: Self-pay

## 2024-03-24 ENCOUNTER — Other Ambulatory Visit (HOSPITAL_COMMUNITY): Payer: Self-pay

## 2024-03-25 ENCOUNTER — Other Ambulatory Visit (HOSPITAL_COMMUNITY): Payer: Self-pay

## 2024-03-25 ENCOUNTER — Other Ambulatory Visit: Payer: Self-pay

## 2024-03-26 ENCOUNTER — Other Ambulatory Visit (HOSPITAL_COMMUNITY): Payer: Self-pay

## 2024-04-08 ENCOUNTER — Other Ambulatory Visit: Payer: Self-pay

## 2024-04-09 ENCOUNTER — Encounter (INDEPENDENT_AMBULATORY_CARE_PROVIDER_SITE_OTHER): Payer: Self-pay | Admitting: *Deleted

## 2024-04-16 ENCOUNTER — Other Ambulatory Visit: Payer: Self-pay

## 2024-04-17 ENCOUNTER — Other Ambulatory Visit: Payer: Self-pay

## 2024-04-20 ENCOUNTER — Other Ambulatory Visit: Payer: Self-pay

## 2024-04-23 ENCOUNTER — Emergency Department (HOSPITAL_COMMUNITY)
Admission: EM | Admit: 2024-04-23 | Discharge: 2024-04-24 | Disposition: A | Discharge status: Home / Self Care | Attending: Emergency Medicine | Admitting: Emergency Medicine

## 2024-04-23 ENCOUNTER — Other Ambulatory Visit: Payer: Self-pay

## 2024-04-23 ENCOUNTER — Encounter (HOSPITAL_COMMUNITY): Payer: Self-pay

## 2024-04-23 DIAGNOSIS — Z7901 Long term (current) use of anticoagulants: Secondary | ICD-10-CM | POA: Insufficient documentation

## 2024-04-23 DIAGNOSIS — M7989 Other specified soft tissue disorders: Secondary | ICD-10-CM | POA: Diagnosis not present

## 2024-04-23 DIAGNOSIS — M79661 Pain in right lower leg: Secondary | ICD-10-CM | POA: Diagnosis not present

## 2024-04-23 DIAGNOSIS — M79604 Pain in right leg: Secondary | ICD-10-CM

## 2024-04-23 MED ORDER — METHYLPREDNISOLONE 4 MG PO TBPK
ORAL_TABLET | ORAL | 0 refills | Status: DC
Start: 2024-04-23 — End: 2024-05-12

## 2024-04-23 MED ORDER — DICLOFENAC SODIUM 1 % EX GEL: 4.0000 g | Freq: Four times a day (QID) | CUTANEOUS | 0 refills | Status: AC

## 2024-04-23 MED ORDER — KETOROLAC TROMETHAMINE 15 MG/ML IJ SOLN
15.0000 mg | Freq: Once | INTRAMUSCULAR | Status: AC
  Administered 2024-04-23: 15 mg via INTRAMUSCULAR
  Filled 2024-04-23: qty 1

## 2024-04-23 NOTE — Discharge Instructions (Signed)
 Come back for an ultrasound.  I think you likely strained your hamstring.  Follow up with your doctor in the office.   Take the steroids as prescribed.  Use the gel as prescribed. Also take tylenol  1000mg (2 extra strength) four times a day.

## 2024-04-23 NOTE — ED Triage Notes (Addendum)
 Pt reports right calf pain when standing since 1pm this afternoon.  Pt feels like there is some swelling to the leg as well.

## 2024-04-24 ENCOUNTER — Ambulatory Visit (HOSPITAL_COMMUNITY)
Admission: RE | Admit: 2024-04-24 | Discharge: 2024-04-24 | Disposition: A | Discharge status: Home / Self Care | Source: Ambulatory Visit | Attending: Emergency Medicine | Admitting: Emergency Medicine

## 2024-04-24 DIAGNOSIS — M79604 Pain in right leg: Secondary | ICD-10-CM | POA: Diagnosis not present

## 2024-04-24 DIAGNOSIS — M7989 Other specified soft tissue disorders: Secondary | ICD-10-CM | POA: Diagnosis not present

## 2024-04-24 NOTE — ED Provider Notes (Signed)
 [place] EMERGENCY DEPARTMENT AT [facility] Provider Note   CSN: [id number] Arrival date & time: 04/23/24  2200     Patient presents with: Leg Pain   Vanessa Anderson is a 78 y.o. female.   78 yo F with a chief complaint of right knee pain.  Pain mostly to the posterior aspect of the knee.  Started earlier today.  Has had some pain with bearing weight and ambulating.  Denies trauma.  Family was concerned that it was a bit swollen and so brought her here with concern for a DVT.   Leg Pain      Prior to Admission medications   Medication Sig Start Date End Date Taking? Authorizing Provider  diclofenac Sodium (VOLTAREN) 1 % GEL Apply 4 g topically 4 (four) times daily. 04/23/24  Yes Vincente Asbridge, DO  methylPREDNISolone (MEDROL DOSEPAK) 4 MG TBPK tablet Day 1: 8mg  before breakfast, 4 mg after lunch, 4 mg after supper, and 8 mg at bedtime Day 2: 4 mg before breakfast, 4 mg after lunch, 4 mg  after supper, and 8 mg  at bedtime Day 3:  4 mg  before breakfast, 4 mg  after lunch, 4 mg after supper, and 4 mg  at bedtime Day 4: 4 mg  before breakfast, 4 mg  after lunch, and 4 mg at bedtime Day 5: 4 mg  before breakfast and 4 mg at bedtime Day 6: 4 mg  before breakfast 04/23/24  Yes Mehkai Gallo, DO  amiodarone  (PACERONE ) 200 MG tablet Take 1 tablet (200 mg total) by mouth every morning. 08/26/23   Gerard Knight, MD  apixaban  (ELIQUIS ) 5 MG TABS tablet Take 1 tablet (5 mg total) by mouth 2 (two) times daily. 12/30/23     atorvastatin  (LIPITOR) 10 MG tablet Take 1 tablet (10 mg total) by mouth every morning. 08/07/23     brimonidine  (ALPHAGAN ) 0.2 % ophthalmic solution Place 1 drop into both eyes 2 (two) times daily. 08/07/23     Calcium  500-2.5 MG-MCG CHEW Chew 1 tablet by mouth every morning. 08/30/21   [provider]  Calcium  500-2.5 MG-MCG CHEW CHEW ONE TABLET BY MOUTH EVERY MORNING 02/24/24     Calcium  Carbonate-Vitamin D (CALCIUM  500/VITAMIN D PO) Take 600 mg by mouth.     [provider]  cephALEXin  (KEFLEX ) 500 MG capsule Take 1 capsule (500 mg total) by mouth every 12 (twelve) hours. 10/07/23     cetirizine  (ZYRTEC ) 10 MG tablet Take 1 tablet (10 mg total) by mouth every morning. 01/13/24     cholecalciferol (VITAMIN D3) 25 MCG (1000 UNIT) tablet Take 1,000 Units by mouth daily.    [provider]  diltiazem  (DILACOR XR ) 120 MG 24 hr capsule Take 1 capsule (120 mg total) by mouth daily. 01/13/24     latanoprost  (XALATAN ) 0.005 % ophthalmic solution Place 1 drop into both eyes every evening. 12/24/23   Grissom, Nicholas B, MD  levothyroxine  (SYNTHROID ) 25 MCG tablet Take 1 tablet (25 mcg total) by mouth daily before breakfast. 08/06/23   Wendel Hals, NP  megestrol  (MEGACE ) 20 MG tablet Take 1 tablet (20 mg total) by mouth 2 (two) times daily. 08/19/23   Wendelyn Halter, MD  olmesartan -hydrochlorothiazide  (BENICAR  HCT) 40-25 MG tablet Take 1 tablet by mouth every morning. 01/13/24     sodium bicarbonate  650 MG tablet Take 1 tablet (650 mg total) by mouth 2 (two) times daily. 08/06/23       Allergies: Hydrocodone   Review of Systems  Updated Vital Signs BP (!) 154/59   Pulse (!) 59   Temp 99.6 F (37.6 C) (Oral)   Resp 10   Wt 89.4 kg   SpO2 96%   BMI 36.03 kg/m   Physical Exam Vitals and nursing note reviewed.  Constitutional:      General: She is not in acute distress.    Appearance: She is well-developed. She is not diaphoretic.  HENT:     Head: Normocephalic and atraumatic.   Eyes:     Pupils: Pupils are equal, round, and reactive to light.    Cardiovascular:     Rate and Rhythm: Normal rate and regular rhythm.     Heart sounds: No murmur heard.    No friction rub. No gallop.  Pulmonary:     Effort: Pulmonary effort is normal.     Breath sounds: No wheezing or rales.  Abdominal:     General: There is no distension.     Palpations: Abdomen is soft.     Tenderness: There is no abdominal tenderness.    Musculoskeletal:        General: No tenderness.     Cervical back: Normal range of motion and neck supple.     Comments: I do not appreciate any obvious edema to the right lower extremity.  Patient has pain at the attachment of the hamstring to the tibia along the medial aspect.  Pulse motor and sensation intact distally.  No significant pain with range of motion of the knee.  No obvious ligamentous laxity.   Skin:    General: Skin is warm and dry.   Neurological:     Mental Status: She is alert and oriented to person, place, and time.   Psychiatric:        Behavior: Behavior normal.     (all labs ordered are listed, but only abnormal results are displayed) Labs Reviewed - No data to display  EKG: None  Radiology: No results found.   Procedures   Medications Ordered in the ED  ketorolac  (TORADOL ) 15 MG/ML injection 15 mg (15 mg Intramuscular Given 04/23/24 2358)                                    Medical Decision Making Risk Prescription drug management.   78 yo F with a chief complaints of right knee pain.  Started earlier today.  Atraumatic.  Family is worried about a DVT.  Daughter provides most of the history.  On my exam she has pain mostly about the attachment of the hamstring to the tibia.  I suspect likely strain.  Unfortunately unable to obtain a DVT ultrasound here.  Will place an order for it to occur tomorrow.  Supportive care at home.  Ace wrap.  12:24 AM:  I have discussed the diagnosis/risks/treatment options with the patient and family.  Evaluation and diagnostic testing in the emergency department does not suggest an emergent condition requiring admission or immediate intervention beyond what has been performed at this time.  They will follow up with PCP. We also discussed returning to the ED immediately if new or worsening sx occur. We discussed the sx which are most concerning (e.g., sudden worsening pain, fever, inability to tolerate by mouth) that  necessitate immediate return. Medications administered to the patient during their visit and any new prescriptions provided to the patient are listed below.  Medications given during  this visit Medications  ketorolac  (TORADOL ) 15 MG/ML injection 15 mg (15 mg Intramuscular Given 04/23/24 2358)     The patient appears reasonably screen and/or stabilized for discharge and I doubt any other medical condition or other [facility] requiring further screening, evaluation, or treatment in the ED at this time prior to discharge.       Final diagnoses:  Right leg pain    ED Discharge Orders          Ordered    US  Venous Img Lower Unilateral Right        04/23/24 2350    methylPREDNISolone (MEDROL DOSEPAK) 4 MG TBPK tablet        04/23/24 2350    diclofenac Sodium (VOLTAREN) 1 % GEL  4 times daily        04/23/24 2350               Albertus Hughs, DO 04/24/24 0024

## 2024-04-27 ENCOUNTER — Other Ambulatory Visit: Payer: Self-pay

## 2024-05-04 ENCOUNTER — Other Ambulatory Visit: Payer: Self-pay

## 2024-05-04 DIAGNOSIS — E063 Autoimmune thyroiditis: Secondary | ICD-10-CM

## 2024-05-05 DIAGNOSIS — I129 Hypertensive chronic kidney disease with stage 1 through stage 4 chronic kidney disease, or unspecified chronic kidney disease: Secondary | ICD-10-CM | POA: Diagnosis not present

## 2024-05-05 DIAGNOSIS — N189 Chronic kidney disease, unspecified: Secondary | ICD-10-CM | POA: Diagnosis not present

## 2024-05-05 DIAGNOSIS — M79604 Pain in right leg: Secondary | ICD-10-CM | POA: Diagnosis not present

## 2024-05-05 DIAGNOSIS — Z7182 Exercise counseling: Secondary | ICD-10-CM | POA: Diagnosis not present

## 2024-05-05 DIAGNOSIS — Z713 Dietary counseling and surveillance: Secondary | ICD-10-CM | POA: Diagnosis not present

## 2024-05-06 DIAGNOSIS — E063 Autoimmune thyroiditis: Secondary | ICD-10-CM | POA: Diagnosis not present

## 2024-05-06 DIAGNOSIS — E038 Other specified hypothyroidism: Secondary | ICD-10-CM | POA: Diagnosis not present

## 2024-05-07 LAB — TSH: TSH: 6.75 u[IU]/mL — ABNORMAL HIGH (ref 0.450–4.500)

## 2024-05-07 LAB — T4, FREE: Free T4: 1.56 ng/dL (ref 0.82–1.77)

## 2024-05-11 NOTE — Patient Instructions (Signed)

## 2024-05-12 ENCOUNTER — Other Ambulatory Visit (HOSPITAL_COMMUNITY): Payer: Self-pay

## 2024-05-12 ENCOUNTER — Ambulatory Visit: Payer: PPO | Admitting: Nurse Practitioner

## 2024-05-12 ENCOUNTER — Encounter: Payer: Self-pay | Admitting: Nurse Practitioner

## 2024-05-12 VITALS — BP 138/60 | HR 72 | Ht 62.0 in | Wt 193.0 lb

## 2024-05-12 DIAGNOSIS — E063 Autoimmune thyroiditis: Secondary | ICD-10-CM

## 2024-05-12 MED ORDER — LEVOTHYROXINE SODIUM 25 MCG PO TABS
25.0000 ug | ORAL_TABLET | Freq: Every day | ORAL | 3 refills | Status: AC
  Filled 2024-05-12 – 2024-05-13 (×2): qty 90, 90d supply, fill #0
  Filled 2024-08-13: qty 30, 30d supply, fill #1
  Filled 2024-08-13: qty 90, 90d supply, fill #1
  Filled 2024-08-14: qty 30, 30d supply, fill #1
  Filled 2024-09-14: qty 30, 30d supply, fill #2
  Filled 2024-10-12 – 2024-10-15 (×2): qty 30, 30d supply, fill #3
  Filled 2024-11-13 – 2024-11-17 (×3): qty 30, 30d supply, fill #4

## 2024-05-12 NOTE — Progress Notes (Signed)
 Endocrinology Follow Up Note                                         05/12/2024, 1:13 PM  Subjective:   Subjective    Vanessa Anderson is a 78 y.o.-year-old female patient being seen in follow up after being seen in consultation for hypothyroidism referred by Vanessa Norleen PEDLAR, MD.   Past Medical History:  Diagnosis Date   Chronic kidney disease    Essential hypertension    Fibromyalgia    Hyperlipidemia    Paroxysmal atrial fibrillation [facility])     Past Surgical History:  Procedure Laterality Date   CATARACT EXTRACTION Bilateral 01/2022   CESAREAN SECTION     COLONOSCOPY N/A 05/28/2014   Procedure: COLONOSCOPY;  Surgeon: Vanessa LITTIE Haddock, MD;  [facility];  Service: Endoscopy;  Laterality: N/A;  8:30 AM   HYSTEROSCOPY WITH D & C N/A 11/07/2016   Procedure: DILATATION AND CURETTAGE /HYSTEROSCOPY;  Surgeon: Vanessa VEAR Inch, MD;  Location: [facility];  Service: Gynecology;  Laterality: N/A;   TOOTH EXTRACTION  09/20/2022   3 wisdom teeth    Social History   Socioeconomic History   Marital status: Widowed    Spouse name: Not on file   Number of children: 2   Years of education: Not on file   Highest education level: Not on file  Occupational History   Not on file  Tobacco Use   Smoking status: Never   Smokeless tobacco: Never  Vaping Use   Vaping status: Never Used  Substance and Sexual Activity   Alcohol use: No    Alcohol/week: 0.0 standard drinks of alcohol   Drug use: No   Sexual activity: Not Currently    Birth control/protection: Post-menopausal  Other Topics Concern   Not on file  Social History Narrative   Not on file   Social Drivers of Health   Financial Resource Strain: Low Risk  (08/19/2023)   Overall Financial Resource Strain (CARDIA)    Difficulty of Paying Living Expenses: Not hard at all  Food Insecurity: No Food Insecurity (08/19/2023)   Hunger Vital Sign    Worried About  Running Out of Food in the Last Year: Never true    Ran Out of Food in the Last Year: Never true  Transportation Needs: No Transportation Needs (08/19/2023)   PRAPARE - Administrator, Civil Service (Medical): No    Lack of Transportation (Non-Medical): No  Physical Activity: Insufficiently Active (08/19/2023)   Exercise Vital Sign    Days of Exercise per Week: 3 days    Minutes of Exercise per Session: 10 min  Stress: No Stress Concern Present (08/19/2023)   Vanessa Anderson of Occupational Health - Occupational Stress Questionnaire    Feeling of Stress : Not at all  Social Connections: Moderately Integrated (08/19/2023)   Social Connection and Isolation Panel    Frequency of Communication with Friends and Family: More than three times a week    Frequency  of Social Gatherings with Friends and Family: More than three times a week    Attends Religious Services: More than 4 times per year    Active Member of [employer] or Organizations: Yes    Attends Banker Meetings: More than 4 times per year    Marital Status: Widowed    Family History  Problem Relation Age of Onset   Heart attack Father     Outpatient Encounter Medications as of 05/12/2024  Medication Sig   amiodarone  (PACERONE ) 200 MG tablet Take 1 tablet (200 mg total) by mouth every morning.   apixaban  (ELIQUIS ) 5 MG TABS tablet Take 1 tablet (5 mg total) by mouth 2 (two) times daily.   atorvastatin  (LIPITOR) 10 MG tablet Take 1 tablet (10 mg total) by mouth every morning.   brimonidine  (ALPHAGAN ) 0.2 % ophthalmic solution Place 1 drop into both eyes 2 (two) times daily.   Calcium  500-2.5 MG-MCG CHEW Chew 1 tablet by mouth every morning.   Calcium  500-2.5 MG-MCG CHEW CHEW ONE TABLET BY MOUTH EVERY MORNING   Calcium  Carbonate-Vitamin D (CALCIUM  500/VITAMIN D PO) Take 600 mg by mouth.   cephALEXin  (KEFLEX ) 500 MG capsule Take 1 capsule (500 mg total) by mouth every 12 (twelve) hours.   cetirizine  (ZYRTEC )  10 MG tablet Take 1 tablet (10 mg total) by mouth every morning.   cholecalciferol (VITAMIN D3) 25 MCG (1000 UNIT) tablet Take 1,000 Units by mouth daily.   diclofenac  Sodium (VOLTAREN ) 1 % GEL Apply 4 g topically 4 (four) times daily.   diltiazem  (DILACOR XR ) 120 MG 24 hr capsule Take 1 capsule (120 mg total) by mouth daily.   latanoprost  (XALATAN ) 0.005 % ophthalmic solution Place 1 drop into both eyes every evening.   megestrol  (MEGACE ) 20 MG tablet Take 1 tablet (20 mg total) by mouth 2 (two) times daily.   olmesartan -hydrochlorothiazide  (BENICAR  HCT) 40-25 MG tablet Take 1 tablet by mouth every morning.   sodium bicarbonate  650 MG tablet Take 1 tablet (650 mg total) by mouth 2 (two) times daily.   [DISCONTINUED] levothyroxine  (SYNTHROID ) 25 MCG tablet Take 1 tablet (25 mcg total) by mouth daily before breakfast.   levothyroxine  (SYNTHROID ) 25 MCG tablet Take 1 tablet (25 mcg total) by mouth daily before breakfast.   [DISCONTINUED] methylPREDNISolone  (MEDROL  DOSEPAK) 4 MG TBPK tablet Day 1: 8mg  before breakfast, 4 mg after lunch, 4 mg after supper, and 8 mg at bedtime Day 2: 4 mg before breakfast, 4 mg after lunch, 4 mg  after supper, and 8 mg  at bedtime Day 3:  4 mg  before breakfast, 4 mg  after lunch, 4 mg after supper, and 4 mg  at bedtime Day 4: 4 mg  before breakfast, 4 mg  after lunch, and 4 mg at bedtime Day 5: 4 mg  before breakfast and 4 mg at bedtime Day 6: 4 mg  before breakfast (Patient not taking: Reported on 05/12/2024)   No facility-administered encounter medications on file as of 05/12/2024.    ALLERGIES: Allergies  Allergen Reactions   Hydrocodone  Itching   VACCINATION STATUS: Immunization History  Administered Date(s) Administered   Influenza-Unspecified 07/06/2018     HPI   Vanessa Anderson is a patient with the above medical history. It was incidentally found during recent blood work that her thyroid  function was off.  She denies taking Biotin supplement.  She has  never had any imaging of her thyroid  in the past.  In review of her medical records,  she is on Amiodarone  which over time can cause thyroid  dysfunction.  She has positive thyroid  antibodies meaning she has autoimmune thyroid  dysfunction.  I reviewed patient's thyroid  tests:  Lab Results  Component Value Date   TSH 6.750 (H) 05/06/2024   TSH 11.500 (H) 04/30/2023   TSH 9.620 (H) 11/08/2022   TSH 6.000 (H) 04/30/2022   TSH 10.600 (H) 12/12/2021   TSH 16.700 (H) 09/14/2021   TSH 14.501 (H) 08/17/2021   TSH 14.976 (H) 06/20/2021   TSH 3.534 11/26/2013   FREET4 1.56 05/06/2024   FREET4 1.42 04/30/2023   FREET4 1.50 11/08/2022   FREET4 1.60 04/30/2022   FREET4 1.32 12/12/2021   FREET4 1.13 09/14/2021     Pt does not have any specific symptoms of thyroid  dysfunction.  Pt denies feeling nodules in neck, hoarseness, dysphagia/odynophagia, SOB with lying down.  she denies family history of thyroid  disorders.  No family history of thyroid  cancer.  No history of radiation therapy to head or neck.  No recent use of iodine supplements.  Denies use of Biotin containing supplements.    I reviewed her chart and she also has a history of Afib (on Amiodarone ), CKD, HTN, HLD.   Review of systems  Constitutional: + Minimally fluctuating body weight,  current Body mass index is 35.3 kg/m. , no fatigue, no subjective hyperthermia, no subjective hypothermia Eyes: no blurry vision, no xerophthalmia ENT: no sore throat, no nodules palpated in throat, no dysphagia/odynophagia, no hoarseness Cardiovascular: no chest pain, no shortness of breath, no palpitations, no leg swelling Respiratory: no cough, no shortness of breath Gastrointestinal: no nausea/vomiting/diarrhea Musculoskeletal: no muscle/joint aches Skin: no rashes, no hyperemia Neurological: no tremors, no numbness, no tingling, no dizziness Psychiatric: no depression, no anxiety   Objective:   Objective     BP 138/60 (BP Location:  Left Arm, Patient Position: Sitting, Cuff Size: Large)   Pulse 72   Ht 5' 2 (1.575 m)   Wt 193 lb (87.5 kg)   BMI 35.30 kg/m  Wt Readings from Last 3 Encounters:  05/12/24 193 lb (87.5 kg)  04/23/24 197 lb (89.4 kg)  12/06/23 194 lb 14.2 oz (88.4 kg)    BP Readings from Last 3 Encounters:  05/12/24 138/60  04/23/24 (!) 154/59  12/06/23 (!) 161/61      Physical Exam- Limited  Constitutional:  Body mass index is 35.3 kg/m. , not in acute distress, normal state of mind Eyes:  EOMI, no exophthalmos Musculoskeletal: no gross deformities, strength intact in all four extremities, no gross restriction of joint movements Skin:  no rashes, no hyperemia Neurological: no tremor with outstretched hands   CMP ( most recent) CMP     Component Value Date/Time   NA 141 10/24/2021 1510   K 3.8 10/24/2021 1510   CL 110 10/24/2021 1510   CO2 25 10/24/2021 1510   GLUCOSE 104 (H) 10/24/2021 1510   BUN 24 (H) 10/24/2021 1510   CREATININE 1.58 (H) 10/24/2021 1510   CALCIUM  9.4 10/24/2021 1510   PROT 7.4 10/24/2021 1510   ALBUMIN 4.2 10/24/2021 1510   AST 16 10/24/2021 1510   ALT 14 10/24/2021 1510   ALKPHOS 110 10/24/2021 1510   BILITOT 0.6 10/24/2021 1510   GFRNONAA 34 (L) 10/24/2021 1510   GFRAA 51 (L) 04/13/2020 1222     Diabetic Labs (most recent): No results found for: HGBA1C, MICROALBUR   Lipid Panel ( most recent) Lipid Panel  No results found for: CHOL, TRIG, HDL, CHOLHDL, VLDL, LDLCALC,  LDLDIRECT, LABVLDL     Lab Results  Component Value Date   TSH 6.750 (H) 05/06/2024   TSH 11.500 (H) 04/30/2023   TSH 9.620 (H) 11/08/2022   TSH 6.000 (H) 04/30/2022   TSH 10.600 (H) 12/12/2021   TSH 16.700 (H) 09/14/2021   TSH 14.501 (H) 08/17/2021   TSH 14.976 (H) 06/20/2021   TSH 3.534 11/26/2013   FREET4 1.56 05/06/2024   FREET4 1.42 04/30/2023   FREET4 1.50 11/08/2022   FREET4 1.60 04/30/2022   FREET4 1.32 12/12/2021   FREET4 1.13 09/14/2021       Latest Reference Range & Units 12/12/21 13:39 04/30/22 14:15 11/08/22 13:50 04/30/23 11:00 05/06/24 10:37  TSH 0.450 - 4.500 uIU/mL 10.600 (H) 6.000 (H) 9.620 (H) 11.500 (H) 6.750 (H)  T4,Free(Direct) 0.82 - 1.77 ng/dL 8.67 8.39 8.49 8.57 8.43  (H): Data is abnormally high  Assessment & Plan:   ASSESSMENT / PLAN:  1. Hypothyroidism-r/t Hashimotos thyroiditis  Her previsit thyroid  function tests are consistent with appropriate hormone replacement.  TSH still elevated but Free T4 on upper end of normal (may be related to Amiodarone ) or steroid effect (recently was on a steroid dose pack). She denies any symptoms of under-replacement.  She is advised to continue Levothyroxine  25 mcg po daily before breakfast.    - We discussed about correct intake of levothyroxine , at fasting, with water , separated by at least 30 minutes from breakfast, and separated by more than 4 hours from calcium , iron, multivitamins, acid reflux medications (PPIs). -Patient is made aware of the fact that thyroid  hormone replacement is needed for life, dose to be adjusted by periodic monitoring of thyroid  function tests.    I spent  15  minutes in the care of the patient today including review of labs from Thyroid  Function, CMP, and other relevant labs ; imaging/biopsy records (current and previous including abstractions from other facilities); face-to-face time discussing  her lab results and symptoms, medications doses, her options of short and long term treatment based on the latest standards of care / guidelines;   and documenting the encounter.  Tonda DEL Osso  participated in the discussions, expressed understanding, and voiced agreement with the above plans.  All questions were answered to her satisfaction. she is encouraged to contact clinic should she have any questions or concerns prior to her return visit.   FOLLOW UP PLAN:  Return in about 1 year (around 05/12/2025) for Thyroid  follow up, Previsit  labs.  Benton Rio, [facility] [address] [place], [place] [address] Phone: [phone] Fax: [phone]  05/12/2024, 1:13 PM

## 2024-05-13 ENCOUNTER — Other Ambulatory Visit: Payer: Self-pay

## 2024-05-18 ENCOUNTER — Other Ambulatory Visit (HOSPITAL_COMMUNITY): Payer: Self-pay

## 2024-05-18 ENCOUNTER — Other Ambulatory Visit: Payer: Self-pay

## 2024-05-19 ENCOUNTER — Other Ambulatory Visit: Payer: Self-pay

## 2024-05-19 ENCOUNTER — Other Ambulatory Visit (HOSPITAL_COMMUNITY): Payer: Self-pay

## 2024-05-19 MED ORDER — ELIQUIS 5 MG PO TABS
5.0000 mg | ORAL_TABLET | Freq: Two times a day (BID) | ORAL | 3 refills | Status: DC
  Filled 2024-05-19 (×2): qty 60, 30d supply, fill #0
  Filled 2024-06-12: qty 60, 30d supply, fill #1
  Filled 2024-07-14: qty 60, 30d supply, fill #2
  Filled 2024-08-13 (×2): qty 60, 30d supply, fill #3

## 2024-05-20 ENCOUNTER — Other Ambulatory Visit: Payer: Self-pay

## 2024-05-28 ENCOUNTER — Other Ambulatory Visit: Payer: Self-pay

## 2024-06-12 ENCOUNTER — Other Ambulatory Visit: Payer: Self-pay

## 2024-06-15 ENCOUNTER — Other Ambulatory Visit: Payer: Self-pay

## 2024-06-16 ENCOUNTER — Other Ambulatory Visit: Payer: Self-pay

## 2024-06-18 ENCOUNTER — Other Ambulatory Visit: Payer: Self-pay

## 2024-07-10 ENCOUNTER — Other Ambulatory Visit: Payer: Self-pay

## 2024-07-10 DIAGNOSIS — N1832 Chronic kidney disease, stage 3b: Secondary | ICD-10-CM | POA: Diagnosis not present

## 2024-07-10 DIAGNOSIS — H401122 Primary open-angle glaucoma, left eye, moderate stage: Secondary | ICD-10-CM | POA: Diagnosis not present

## 2024-07-13 DIAGNOSIS — N184 Chronic kidney disease, stage 4 (severe): Secondary | ICD-10-CM | POA: Diagnosis not present

## 2024-07-13 DIAGNOSIS — I129 Hypertensive chronic kidney disease with stage 1 through stage 4 chronic kidney disease, or unspecified chronic kidney disease: Secondary | ICD-10-CM | POA: Diagnosis not present

## 2024-07-13 DIAGNOSIS — E872 Acidosis, unspecified: Secondary | ICD-10-CM | POA: Diagnosis not present

## 2024-07-14 ENCOUNTER — Other Ambulatory Visit: Payer: Self-pay

## 2024-07-15 ENCOUNTER — Other Ambulatory Visit: Payer: Self-pay

## 2024-07-30 DIAGNOSIS — Z23 Encounter for immunization: Secondary | ICD-10-CM | POA: Diagnosis not present

## 2024-08-05 ENCOUNTER — Other Ambulatory Visit: Payer: Self-pay | Admitting: Cardiology

## 2024-08-05 ENCOUNTER — Other Ambulatory Visit (HOSPITAL_COMMUNITY): Payer: Self-pay

## 2024-08-05 ENCOUNTER — Other Ambulatory Visit: Payer: Self-pay

## 2024-08-05 MED ORDER — CALCIUM 500-2.5 MG-MCG PO CHEW
1.0000 | CHEWABLE_TABLET | Freq: Every morning | ORAL | 5 refills | Status: AC
  Filled 2024-08-05 – 2024-08-12 (×3): qty 30, 30d supply, fill #0
  Filled 2024-11-13 – 2024-11-18 (×4): qty 30, 30d supply, fill #1

## 2024-08-05 MED ORDER — ATORVASTATIN CALCIUM 10 MG PO TABS
10.0000 mg | ORAL_TABLET | Freq: Every morning | ORAL | 11 refills | Status: AC
  Filled 2024-08-05 – 2024-08-13 (×3): qty 30, 30d supply, fill #0
  Filled 2024-09-14: qty 30, 30d supply, fill #1
  Filled 2024-10-12: qty 30, 30d supply, fill #2
  Filled 2024-11-13 – 2024-11-16 (×2): qty 30, 30d supply, fill #3

## 2024-08-06 ENCOUNTER — Other Ambulatory Visit: Payer: Self-pay

## 2024-08-06 ENCOUNTER — Other Ambulatory Visit (HOSPITAL_COMMUNITY): Payer: Self-pay

## 2024-08-06 MED ORDER — AMIODARONE HCL 200 MG PO TABS
200.0000 mg | ORAL_TABLET | Freq: Every morning | ORAL | 3 refills | Status: AC
  Filled 2024-08-13 (×2): qty 30, 30d supply, fill #0
  Filled 2024-09-14: qty 30, 30d supply, fill #1
  Filled 2024-10-12: qty 30, 30d supply, fill #2
  Filled 2024-11-13 – 2024-11-16 (×2): qty 30, 30d supply, fill #3

## 2024-08-06 MED ORDER — CETIRIZINE HCL 10 MG PO TABS
10.0000 mg | ORAL_TABLET | Freq: Every morning | ORAL | 5 refills | Status: AC
  Filled 2024-08-06 – 2024-08-13 (×3): qty 30, 30d supply, fill #0
  Filled 2024-09-14: qty 30, 30d supply, fill #1
  Filled 2024-10-12: qty 30, 30d supply, fill #2
  Filled 2024-11-13 – 2024-11-16 (×2): qty 30, 30d supply, fill #3

## 2024-08-12 ENCOUNTER — Other Ambulatory Visit: Payer: Self-pay

## 2024-08-13 ENCOUNTER — Other Ambulatory Visit (HOSPITAL_COMMUNITY): Payer: Self-pay

## 2024-08-13 ENCOUNTER — Other Ambulatory Visit: Payer: Self-pay

## 2024-08-13 MED ORDER — SODIUM BICARBONATE 650 MG PO TABS
650.0000 mg | ORAL_TABLET | Freq: Two times a day (BID) | ORAL | 12 refills | Status: AC
  Filled 2024-08-13 (×2): qty 60, 30d supply, fill #0
  Filled 2024-09-14: qty 60, 30d supply, fill #1
  Filled 2024-10-12: qty 60, 30d supply, fill #2
  Filled 2024-11-13 – 2024-11-16 (×2): qty 60, 30d supply, fill #3

## 2024-08-14 ENCOUNTER — Other Ambulatory Visit: Payer: Self-pay

## 2024-08-17 ENCOUNTER — Other Ambulatory Visit: Payer: Self-pay

## 2024-08-17 DIAGNOSIS — N1832 Chronic kidney disease, stage 3b: Secondary | ICD-10-CM | POA: Diagnosis not present

## 2024-08-17 DIAGNOSIS — M8580 Other specified disorders of bone density and structure, unspecified site: Secondary | ICD-10-CM | POA: Diagnosis not present

## 2024-08-17 DIAGNOSIS — E039 Hypothyroidism, unspecified: Secondary | ICD-10-CM | POA: Diagnosis not present

## 2024-08-18 LAB — LAB REPORT - SCANNED
A1c: 5.7
Albumin, Urine POC: 118.4
Creatinine, POC: 167.8 mg/dL
EGFR: 31
Free T4: 1.37 ng/dL
Microalb Creat Ratio: 71
TSH: 7.76 — AB (ref 0.41–5.90)

## 2024-08-21 ENCOUNTER — Other Ambulatory Visit: Payer: Self-pay | Admitting: Obstetrics & Gynecology

## 2024-08-24 ENCOUNTER — Other Ambulatory Visit: Payer: Self-pay

## 2024-08-24 ENCOUNTER — Other Ambulatory Visit (HOSPITAL_COMMUNITY): Payer: Self-pay

## 2024-08-24 DIAGNOSIS — I480 Paroxysmal atrial fibrillation: Secondary | ICD-10-CM | POA: Diagnosis not present

## 2024-08-24 DIAGNOSIS — N1832 Chronic kidney disease, stage 3b: Secondary | ICD-10-CM | POA: Diagnosis not present

## 2024-08-24 DIAGNOSIS — R7303 Prediabetes: Secondary | ICD-10-CM | POA: Diagnosis not present

## 2024-08-24 DIAGNOSIS — Z713 Dietary counseling and surveillance: Secondary | ICD-10-CM | POA: Diagnosis not present

## 2024-08-24 DIAGNOSIS — I129 Hypertensive chronic kidney disease with stage 1 through stage 4 chronic kidney disease, or unspecified chronic kidney disease: Secondary | ICD-10-CM | POA: Diagnosis not present

## 2024-08-24 DIAGNOSIS — E782 Mixed hyperlipidemia: Secondary | ICD-10-CM | POA: Diagnosis not present

## 2024-08-24 DIAGNOSIS — R748 Abnormal levels of other serum enzymes: Secondary | ICD-10-CM | POA: Diagnosis not present

## 2024-08-24 DIAGNOSIS — M79604 Pain in right leg: Secondary | ICD-10-CM | POA: Diagnosis not present

## 2024-08-24 DIAGNOSIS — E039 Hypothyroidism, unspecified: Secondary | ICD-10-CM | POA: Diagnosis not present

## 2024-08-24 DIAGNOSIS — Z79890 Hormone replacement therapy (postmenopausal): Secondary | ICD-10-CM | POA: Diagnosis not present

## 2024-08-24 DIAGNOSIS — D72829 Elevated white blood cell count, unspecified: Secondary | ICD-10-CM | POA: Diagnosis not present

## 2024-08-24 MED ORDER — CALCIUM 500-2.5 MG-MCG PO CHEW
CHEWABLE_TABLET | ORAL | 5 refills | Status: AC
  Filled 2024-09-14: qty 30, 30d supply, fill #0
  Filled 2024-10-12 – 2024-10-15 (×2): qty 30, 30d supply, fill #1

## 2024-08-25 ENCOUNTER — Other Ambulatory Visit (HOSPITAL_COMMUNITY): Payer: Self-pay

## 2024-08-26 ENCOUNTER — Other Ambulatory Visit (HOSPITAL_COMMUNITY): Payer: Self-pay

## 2024-08-26 MED ORDER — VITAMIN D (ERGOCALCIFEROL) 50000 UNITS PO CAPS
1.0000 | ORAL_CAPSULE | ORAL | 1 refills | Status: AC
  Filled 2024-08-26: qty 12, 84d supply, fill #0
  Filled 2024-11-11 – 2024-11-12 (×2): qty 12, 84d supply, fill #1

## 2024-08-27 ENCOUNTER — Other Ambulatory Visit: Payer: Self-pay

## 2024-08-28 ENCOUNTER — Encounter: Payer: Self-pay | Admitting: Obstetrics & Gynecology

## 2024-08-28 ENCOUNTER — Ambulatory Visit: Admitting: Obstetrics & Gynecology

## 2024-08-28 ENCOUNTER — Other Ambulatory Visit (HOSPITAL_COMMUNITY): Payer: Self-pay

## 2024-08-28 ENCOUNTER — Other Ambulatory Visit: Payer: Self-pay

## 2024-08-28 VITALS — BP 165/75 | HR 80 | Ht 62.0 in | Wt 193.0 lb

## 2024-08-28 DIAGNOSIS — N8502 Endometrial intraepithelial neoplasia [EIN]: Secondary | ICD-10-CM | POA: Diagnosis not present

## 2024-08-28 MED ORDER — MEGESTROL ACETATE 20 MG PO TABS
20.0000 mg | ORAL_TABLET | Freq: Two times a day (BID) | ORAL | 11 refills | Status: DC
  Filled 2024-08-28: qty 60, 30d supply, fill #0

## 2024-08-28 MED ORDER — MEGESTROL ACETATE 20 MG PO TABS: 20.0000 mg | ORAL_TABLET | Freq: Two times a day (BID) | ORAL | 11 refills | Status: AC

## 2024-08-28 NOTE — Progress Notes (Signed)
 Follow up appointment for results: CAH diagnosed 2008 Amenorrhea on megestrol  therapy  Chief Complaint  Patient presents with   Follow-up    Blood pressure (!) 165/75, pulse 80, height 5' 2 (1.575 m), weight 193 lb (87.5 kg).    On megestrol  20 mg BID for management of CAH ^BID when she had bleeding on eliquis  No bleeding at all on BID No other complaints  MEDS ordered this encounter: Meds ordered this encounter  Medications   DISCONTD: megestrol  (MEGACE ) 20 MG tablet    Sig: Take 1 tablet (20 mg total) by mouth 2 (two) times daily.    Dispense:  60 tablet    Refill:  11   megestrol  (MEGACE ) 20 MG tablet    Sig: Take 1 tablet (20 mg total) by mouth 2 (two) times daily.    Dispense:  60 tablet    Refill:  11    Orders for this encounter: No orders of the defined types were placed in this encounter.   Impression + Management Plan  Complex atypical endometrial hyperplasia: 2008 on megestrol  20 mg BID x 16 years  N85.02       no bleeding    Follow Up: Return in about 1 year (around 08/28/2025).     All questions were answered.  Past Medical History:  Diagnosis Date   Chronic kidney disease    Essential hypertension    Fibromyalgia    Hyperlipidemia    Paroxysmal atrial fibrillation [facility])     Past Surgical History:  Procedure Laterality Date   CATARACT EXTRACTION Bilateral 01/2022   CESAREAN SECTION     COLONOSCOPY N/A 05/28/2014   Procedure: COLONOSCOPY;  Surgeon: [name], MD;  [facility];  Service: Endoscopy;  Laterality: N/A;  8:30 AM   HYSTEROSCOPY WITH D & C N/A 11/07/2016   Procedure: DILATATION AND CURETTAGE /HYSTEROSCOPY;  Surgeon: [name], MD;  Location: [facility];  Service: Gynecology;  Laterality: N/A;   TOOTH EXTRACTION  09/20/2022   3 wisdom teeth    OB History     Gravida  2   Para      Term      Preterm      AB      Living         SAB      IAB      Ectopic      Multiple      Live Births               Allergies  Allergen Reactions   Hydrocodone  Itching    Social History   Socioeconomic History   Marital status: Widowed    Spouse name: Not on file   Number of children: 2   Years of education: Not on file   Highest education level: Not on file  Occupational History   Not on file  Tobacco Use   Smoking status: Never   Smokeless tobacco: Never  Vaping Use   Vaping status: Never Used  Substance and Sexual Activity   Alcohol use: No    Alcohol/week: 0.0 standard drinks of alcohol   Drug use: No   Sexual activity: Not Currently    Birth control/protection: Post-menopausal  Other Topics Concern   Not on file  Social History Narrative   Not on file   Social Drivers of Health   Financial Resource Strain: Low Risk  (08/19/2023)   Overall Financial Resource Strain (CARDIA)    Difficulty of Paying Living  Expenses: Not hard at all  Food Insecurity: No Food Insecurity (08/19/2023)   Hunger Vital Sign    Worried About Running Out of Food in the Last Year: Never true    Ran Out of Food in the Last Year: Never true  Transportation Needs: No Transportation Needs (08/19/2023)   PRAPARE - Administrator, Civil Service (Medical): No    Lack of Transportation (Non-Medical): No  Physical Activity: Insufficiently Active (08/19/2023)   Exercise Vital Sign    Days of Exercise per Week: 3 days    Minutes of Exercise per Session: 10 min  Stress: No Stress Concern Present (08/19/2023)   [employer] of Occupational Health - Occupational Stress Questionnaire    Feeling of Stress : Not at all  Social Connections: Moderately Integrated (08/19/2023)   Social Connection and Isolation Panel    Frequency of Communication with Friends and Family: More than three times a week    Frequency of Social Gatherings with Friends and Family: More than three times a week    Attends Religious Services: More than 4 times per year    Active Member of [employer] or Organizations:  Yes    Attends Banker Meetings: More than 4 times per year    Marital Status: Widowed    Family History  Problem Relation Age of Onset   Heart attack Father

## 2024-09-14 ENCOUNTER — Other Ambulatory Visit: Payer: Self-pay

## 2024-09-14 ENCOUNTER — Other Ambulatory Visit (HOSPITAL_COMMUNITY): Payer: Self-pay

## 2024-09-14 MED ORDER — ELIQUIS 5 MG PO TABS
5.0000 mg | ORAL_TABLET | Freq: Two times a day (BID) | ORAL | 3 refills | Status: AC
  Filled 2024-09-14: qty 60, 30d supply, fill #0
  Filled 2024-10-12: qty 60, 30d supply, fill #1
  Filled 2024-11-13 – 2024-11-16 (×2): qty 60, 30d supply, fill #2

## 2024-09-15 ENCOUNTER — Other Ambulatory Visit: Payer: Self-pay

## 2024-09-16 ENCOUNTER — Other Ambulatory Visit: Payer: Self-pay

## 2024-09-17 ENCOUNTER — Other Ambulatory Visit: Payer: Self-pay

## 2024-09-18 ENCOUNTER — Other Ambulatory Visit: Payer: Self-pay

## 2024-09-21 ENCOUNTER — Other Ambulatory Visit: Payer: Self-pay

## 2024-09-24 ENCOUNTER — Other Ambulatory Visit: Payer: Self-pay

## 2024-09-29 ENCOUNTER — Other Ambulatory Visit: Payer: Self-pay

## 2024-09-30 ENCOUNTER — Other Ambulatory Visit: Payer: Self-pay

## 2024-10-08 ENCOUNTER — Other Ambulatory Visit (HOSPITAL_COMMUNITY): Payer: Self-pay | Admitting: Internal Medicine

## 2024-10-08 DIAGNOSIS — Z1231 Encounter for screening mammogram for malignant neoplasm of breast: Secondary | ICD-10-CM

## 2024-10-12 ENCOUNTER — Other Ambulatory Visit (HOSPITAL_COMMUNITY): Payer: Self-pay

## 2024-10-12 ENCOUNTER — Other Ambulatory Visit: Payer: Self-pay

## 2024-10-13 ENCOUNTER — Other Ambulatory Visit: Payer: Self-pay

## 2024-10-14 ENCOUNTER — Other Ambulatory Visit (HOSPITAL_COMMUNITY): Payer: Self-pay

## 2024-10-15 ENCOUNTER — Other Ambulatory Visit: Payer: Self-pay

## 2024-11-02 ENCOUNTER — Ambulatory Visit: Attending: Cardiology | Admitting: Cardiology

## 2024-11-02 ENCOUNTER — Encounter: Payer: Self-pay | Admitting: Cardiology

## 2024-11-02 VITALS — BP 138/62 | HR 68 | Ht 62.0 in | Wt 194.6 lb

## 2024-11-02 DIAGNOSIS — I4819 Other persistent atrial fibrillation: Secondary | ICD-10-CM | POA: Diagnosis not present

## 2024-11-02 DIAGNOSIS — I480 Paroxysmal atrial fibrillation: Secondary | ICD-10-CM | POA: Diagnosis not present

## 2024-11-02 DIAGNOSIS — I10 Essential (primary) hypertension: Secondary | ICD-10-CM | POA: Diagnosis not present

## 2024-11-02 NOTE — Progress Notes (Signed)
"  ° ° °  Cardiology Office Note  Date: 11/02/2024   ID: Vanessa Anderson 06/30/1946, MRN [id number]  History of Present Illness: Vanessa Anderson is a 78 y.o. female last seen in November 2024.  She is here today with her daughter for a follow-up visit.  Reports no major change in health, has not been bothered by any palpitations to suggest recurring atrial fibrillation.  She continues to follow regularly with Dr. Shona for primary care, also sees endocrinology and nephrology.  I reviewed her medications and interval lab work.  She does not report any spontaneous bleeding problems on Eliquis .  I rechecked her blood pressure today at 138/62.  She continues to track this at home and reports systolics generally around 130.  I reviewed her ECG today which shows normal sinus rhythm with nonspecific ST changes.  Physical Exam: VS:  BP 138/62 (BP Location: Left Arm)   Pulse 68   Ht 5' 2 (1.575 m)   Wt 194 lb 9.6 oz (88.3 kg)   SpO2 93%   BMI 35.59 kg/m , BMI Body mass index is 35.59 kg/m.  Wt Readings from Last 3 Encounters:  11/02/24 194 lb 9.6 oz (88.3 kg)  08/28/24 193 lb (87.5 kg)  05/12/24 193 lb (87.5 kg)    General: Patient appears comfortable at rest. HEENT: Conjunctiva and lids normal. Neck: Supple, no elevated JVP or carotid bruits. Lungs: Clear to auscultation, nonlabored breathing at rest. Cardiac: Regular rate and rhythm, no S3 or significant systolic murmur. Extremities: No pitting edema.  ECG:  An ECG dated 09/30/2023 was personally reviewed today and demonstrated:  Sinus rhythm with nonspecific T wave changes.  Labwork: 08/18/2024: TSH 7.76  October 2025: Hemoglobin 12.6, platelets 262, BUN 27, creatinine 1.66, potassium 4.7, AST 19, ALT 18, triglycerides 66, HDL 37, LDL 56  Other Studies Reviewed Today:  No interval cardiac testing for review today.  Assessment and Plan:  1.  Persistent atrial fibrillation with CHA2DS2-VASc score 3.  Doing quite well with no  obvious recurring atrial arrhythmias, ECG shows sinus rhythm today.  She remains on Eliquis  5 mg twice daily for stroke prophylaxis, no reported spontaneous bleeding problems.  For now continue diltiazem  CD100 and 20 mg daily and amiodarone  200 mg daily.  I reviewed her interval lab work.   2.  Primary hypertension.  Largely has whitecoat hypertension based on review of home blood pressure.  No changes made today, she continues on Benicar  HCTZ 40/25 mg daily with follow-up by Dr. Shona.  3.  Mixed hyperlipidemia, continues on Crestor 10 mg daily.  LDL 56 in October.  4.  CKD stage IIIb, creatinine 1.66 with GFR 31.  She follows with nephrology.   Disposition:  Follow up 1 year.  Signed, Jayson JUDITHANN Sierras, M.D., F.A.C.C. [facility]

## 2024-11-02 NOTE — Patient Instructions (Signed)

## 2024-11-11 ENCOUNTER — Other Ambulatory Visit (HOSPITAL_COMMUNITY): Payer: Self-pay

## 2024-11-11 ENCOUNTER — Other Ambulatory Visit: Payer: Self-pay

## 2024-11-12 ENCOUNTER — Other Ambulatory Visit (HOSPITAL_COMMUNITY): Payer: Self-pay

## 2024-11-13 ENCOUNTER — Other Ambulatory Visit: Payer: Self-pay

## 2024-11-13 ENCOUNTER — Other Ambulatory Visit (HOSPITAL_COMMUNITY): Payer: Self-pay

## 2024-11-16 ENCOUNTER — Other Ambulatory Visit (HOSPITAL_COMMUNITY): Payer: Self-pay

## 2024-11-16 ENCOUNTER — Other Ambulatory Visit: Payer: Self-pay

## 2024-11-16 MED ORDER — DILTIAZEM HCL ER 120 MG PO CP24
120.0000 mg | ORAL_CAPSULE | Freq: Every day | ORAL | 2 refills | Status: AC
  Filled 2024-11-16: qty 90, 90d supply, fill #0

## 2024-11-16 MED ORDER — OLMESARTAN MEDOXOMIL-HCTZ 40-25 MG PO TABS
1.0000 | ORAL_TABLET | Freq: Every morning | ORAL | 2 refills | Status: AC
  Filled 2024-11-16 (×2): qty 30, 30d supply, fill #0
  Filled 2024-11-16: qty 90, 90d supply, fill #0

## 2024-11-16 MED ORDER — DILTIAZEM HCL ER 120 MG PO CP24
120.0000 mg | ORAL_CAPSULE | Freq: Every day | ORAL | 2 refills | Status: AC
  Filled 2024-11-16: qty 30, 30d supply, fill #0
  Filled 2024-11-16: qty 90, 90d supply, fill #0
  Filled 2024-11-16: qty 30, 30d supply, fill #0

## 2024-11-16 MED ORDER — OLMESARTAN MEDOXOMIL-HCTZ 40-25 MG PO TABS
1.0000 | ORAL_TABLET | Freq: Every morning | ORAL | 2 refills | Status: AC
  Filled 2024-11-16: qty 90, 90d supply, fill #0

## 2024-11-17 ENCOUNTER — Other Ambulatory Visit: Payer: Self-pay

## 2024-11-17 ENCOUNTER — Other Ambulatory Visit (HOSPITAL_COMMUNITY): Payer: Self-pay

## 2024-11-18 ENCOUNTER — Other Ambulatory Visit (HOSPITAL_COMMUNITY): Payer: Self-pay

## 2024-11-18 ENCOUNTER — Other Ambulatory Visit: Payer: Self-pay

## 2024-11-19 ENCOUNTER — Other Ambulatory Visit (HOSPITAL_COMMUNITY): Payer: Self-pay

## 2024-11-23 ENCOUNTER — Ambulatory Visit (HOSPITAL_COMMUNITY)

## 2024-11-27 ENCOUNTER — Ambulatory Visit (HOSPITAL_COMMUNITY)

## 2024-12-03 ENCOUNTER — Encounter (HOSPITAL_COMMUNITY): Payer: Self-pay

## 2024-12-03 ENCOUNTER — Emergency Department (HOSPITAL_COMMUNITY)

## 2024-12-03 ENCOUNTER — Inpatient Hospital Stay (HOSPITAL_COMMUNITY)
Admission: EM | Admit: 2024-12-03 | Discharge: 2024-12-07 | DRG: 563 | Disposition: A | Attending: Internal Medicine | Admitting: Internal Medicine

## 2024-12-03 ENCOUNTER — Other Ambulatory Visit: Payer: Self-pay

## 2024-12-03 DIAGNOSIS — R7303 Prediabetes: Secondary | ICD-10-CM | POA: Diagnosis present

## 2024-12-03 DIAGNOSIS — E039 Hypothyroidism, unspecified: Secondary | ICD-10-CM | POA: Diagnosis present

## 2024-12-03 DIAGNOSIS — M25561 Pain in right knee: Secondary | ICD-10-CM

## 2024-12-03 DIAGNOSIS — Z9181 History of falling: Secondary | ICD-10-CM

## 2024-12-03 DIAGNOSIS — Z7901 Long term (current) use of anticoagulants: Secondary | ICD-10-CM

## 2024-12-03 DIAGNOSIS — W19XXXA Unspecified fall, initial encounter: Secondary | ICD-10-CM

## 2024-12-03 DIAGNOSIS — R54 Age-related physical debility: Secondary | ICD-10-CM | POA: Diagnosis present

## 2024-12-03 DIAGNOSIS — I4819 Other persistent atrial fibrillation: Secondary | ICD-10-CM | POA: Diagnosis present

## 2024-12-03 DIAGNOSIS — Z79890 Hormone replacement therapy (postmenopausal): Secondary | ICD-10-CM

## 2024-12-03 DIAGNOSIS — Z8249 Family history of ischemic heart disease and other diseases of the circulatory system: Secondary | ICD-10-CM

## 2024-12-03 DIAGNOSIS — E66811 Obesity, class 1: Secondary | ICD-10-CM | POA: Diagnosis present

## 2024-12-03 DIAGNOSIS — I129 Hypertensive chronic kidney disease with stage 1 through stage 4 chronic kidney disease, or unspecified chronic kidney disease: Secondary | ICD-10-CM | POA: Diagnosis present

## 2024-12-03 DIAGNOSIS — R55 Syncope and collapse: Secondary | ICD-10-CM

## 2024-12-03 DIAGNOSIS — Z9842 Cataract extraction status, left eye: Secondary | ICD-10-CM

## 2024-12-03 DIAGNOSIS — Z9841 Cataract extraction status, right eye: Secondary | ICD-10-CM

## 2024-12-03 DIAGNOSIS — E785 Hyperlipidemia, unspecified: Secondary | ICD-10-CM | POA: Diagnosis present

## 2024-12-03 DIAGNOSIS — N390 Urinary tract infection, site not specified: Secondary | ICD-10-CM | POA: Diagnosis present

## 2024-12-03 DIAGNOSIS — I10 Essential (primary) hypertension: Secondary | ICD-10-CM | POA: Diagnosis present

## 2024-12-03 DIAGNOSIS — Z885 Allergy status to narcotic agent status: Secondary | ICD-10-CM

## 2024-12-03 DIAGNOSIS — Z6834 Body mass index (BMI) 34.0-34.9, adult: Secondary | ICD-10-CM

## 2024-12-03 DIAGNOSIS — W000XXA Fall on same level due to ice and snow, initial encounter: Secondary | ICD-10-CM | POA: Diagnosis present

## 2024-12-03 DIAGNOSIS — S42291A Other displaced fracture of upper end of right humerus, initial encounter for closed fracture: Principal | ICD-10-CM | POA: Diagnosis present

## 2024-12-03 DIAGNOSIS — I480 Paroxysmal atrial fibrillation: Secondary | ICD-10-CM | POA: Diagnosis present

## 2024-12-03 DIAGNOSIS — B9620 Unspecified Escherichia coli [E. coli] as the cause of diseases classified elsewhere: Secondary | ICD-10-CM | POA: Diagnosis present

## 2024-12-03 DIAGNOSIS — M797 Fibromyalgia: Secondary | ICD-10-CM | POA: Diagnosis present

## 2024-12-03 DIAGNOSIS — N189 Chronic kidney disease, unspecified: Secondary | ICD-10-CM | POA: Diagnosis present

## 2024-12-03 DIAGNOSIS — S42214A Unspecified nondisplaced fracture of surgical neck of right humerus, initial encounter for closed fracture: Principal | ICD-10-CM | POA: Diagnosis present

## 2024-12-03 LAB — CBC WITH DIFFERENTIAL/PLATELET
Abs Immature Granulocytes: 0.18 10*3/uL — ABNORMAL HIGH (ref 0.00–0.07)
Basophils Absolute: 0.1 10*3/uL (ref 0.0–0.1)
Basophils Relative: 0 %
Eosinophils Absolute: 0 10*3/uL (ref 0.0–0.5)
Eosinophils Relative: 0 %
HCT: 38.8 % (ref 36.0–46.0)
Hemoglobin: 12.1 g/dL (ref 12.0–15.0)
Immature Granulocytes: 1 %
Lymphocytes Relative: 5 %
Lymphs Abs: 1.2 10*3/uL (ref 0.7–4.0)
MCH: 29.9 pg (ref 26.0–34.0)
MCHC: 31.2 g/dL (ref 30.0–36.0)
MCV: 95.8 fL (ref 80.0–100.0)
Monocytes Absolute: 1.4 10*3/uL — ABNORMAL HIGH (ref 0.1–1.0)
Monocytes Relative: 6 %
Neutro Abs: 19.6 10*3/uL — ABNORMAL HIGH (ref 1.7–7.7)
Neutrophils Relative %: 88 %
Platelets: 267 10*3/uL (ref 150–400)
RBC: 4.05 MIL/uL (ref 3.87–5.11)
RDW: 14.3 % (ref 11.5–15.5)
WBC: 22.4 10*3/uL — ABNORMAL HIGH (ref 4.0–10.5)
nRBC: 0.1 % (ref 0.0–0.2)

## 2024-12-03 LAB — BASIC METABOLIC PANEL WITH GFR
Anion gap: 15 (ref 5–15)
BUN: 27 mg/dL — ABNORMAL HIGH (ref 8–23)
CO2: 17 mmol/L — ABNORMAL LOW (ref 22–32)
Calcium: 9.3 mg/dL (ref 8.9–10.3)
Chloride: 107 mmol/L (ref 98–111)
Creatinine, Ser: 1.71 mg/dL — ABNORMAL HIGH (ref 0.44–1.00)
GFR, Estimated: 30 mL/min — ABNORMAL LOW
Glucose, Bld: 160 mg/dL — ABNORMAL HIGH (ref 70–99)
Potassium: 4.8 mmol/L (ref 3.5–5.1)
Sodium: 139 mmol/L (ref 135–145)

## 2024-12-03 LAB — CK: Total CK: 340 U/L — ABNORMAL HIGH (ref 38–234)

## 2024-12-03 LAB — TROPONIN T, HIGH SENSITIVITY: Troponin T High Sensitivity: 12 ng/L (ref 0–19)

## 2024-12-03 MED ORDER — ONDANSETRON HCL 4 MG/2ML IJ SOLN
4.0000 mg | Freq: Once | INTRAMUSCULAR | Status: AC
  Administered 2024-12-03: 4 mg via INTRAVENOUS
  Filled 2024-12-03: qty 2

## 2024-12-03 MED ORDER — OXYCODONE-ACETAMINOPHEN 5-325 MG PO TABS
1.0000 | ORAL_TABLET | Freq: Four times a day (QID) | ORAL | 0 refills | Status: AC | PRN
  Filled 2024-12-03: qty 6, 2d supply, fill #0

## 2024-12-03 MED ORDER — OXYCODONE-ACETAMINOPHEN 5-325 MG PO TABS: 1.0000 | ORAL_TABLET | Freq: Four times a day (QID) | ORAL | 0 refills | Status: DC | PRN

## 2024-12-03 MED ORDER — LACTATED RINGERS IV BOLUS
500.0000 mL | Freq: Once | INTRAVENOUS | Status: AC
  Administered 2024-12-03: 500 mL via INTRAVENOUS

## 2024-12-03 NOTE — ED Triage Notes (Signed)
 Pt BIB ems from a fall outside. Patient was outside about 30 minutes before family found her. Pt denies hitting head or LOC. Pt complaining of rt shoulder pain (per EMS no deformities.) VS stable.

## 2024-12-03 NOTE — ED Provider Notes (Signed)
 " [place] [facility]ERG[facility] DEPARTMENT AT [facility] Provider Note   CSN: [id number] Arrival date & time: 12/03/24  1925     Patient presents with: Vanessa Anderson is a 79 y.o. female.   79 year old female presents for evaluation of fall.  Went outside and slipped on ice fell landing on Vanessa Anderson right side.  Complaining of right knee pain and right shoulder pain.  States Vanessa Anderson is otherwise feeling well and Vanessa Anderson has no other complaints or concerns at this time.  States Vanessa Anderson did not hit Vanessa Anderson head or lose consciousness.  This happened around 6 PM tonight.   Fall Pertinent negatives include no chest pain, no abdominal pain and no shortness of breath.       Prior to Admission medications  Medication Sig Start Date End Date Taking? Authorizing Provider  amiodarone  (PACERONE ) 200 MG tablet Take 1 tablet (200 mg total) by mouth every morning. 08/06/24   [name], MD  apixaban  (ELIQUIS ) 5 MG TABS tablet Take 1 tablet (5 mg total) by mouth 2 (two) times daily. 09/14/24     atorvastatin  (LIPITOR) 10 MG tablet Take 1 tablet (10 mg total) by mouth in the morning. 08/05/24     brimonidine  (ALPHAGAN ) 0.2 % ophthalmic solution Place 1 drop into both eyes 2 (two) times daily. 08/07/23     Calcium  500-2.5 MG-MCG CHEW Chew 1 tablet by mouth every morning. 08/30/21   [provider]  Calcium  500-2.5 MG-MCG CHEW Chew 1 tablet by mouth in the morning. 08/05/24     Calcium  500-2.5 MG-MCG CHEW Chew 1 tablet by mouth every morning 08/24/24     Calcium  Carbonate-Vitamin D  (CALCIUM  500/VITAMIN D  PO) Take 600 mg by mouth.    [provider]  cephALEXin  (KEFLEX ) 500 MG capsule Take 1 capsule (500 mg total) by mouth every 12 (twelve) hours. 10/07/23     cetirizine  (ZYRTEC ) 10 MG tablet Take 1 tablet (10 mg total) by mouth every morning. 08/05/24     cholecalciferol (VITAMIN D3) 25 MCG (1000 UNIT) tablet Take 1,000 Units by mouth daily.    [provider]  diclofenac  Sodium  (VOLTAREN ) 1 % GEL Apply 4 g topically 4 (four) times daily. 04/23/24   [name], DO  diltiazem  (DILACOR XR ) 120 MG 24 hr capsule Take 1 capsule (120 mg total) by mouth daily. 11/16/24     diltiazem  (DILACOR XR ) 120 MG 24 hr capsule Take 1 capsule (120 mg total) by mouth daily. 11/16/24     latanoprost  (XALATAN ) 0.005 % ophthalmic solution Place 1 drop into both eyes every evening. 12/24/23   Grissom, Nicholas B, MD  levothyroxine  (SYNTHROID ) 25 MCG tablet Take 1 tablet (25 mcg total) by mouth daily before breakfast. 05/12/24   [name], NP  megestrol  (MEGACE ) 20 MG tablet Take 1 tablet (20 mg total) by mouth 2 (two) times daily. 08/28/24   Jayne Vonn VEAR, MD  olmesartan -hydrochlorothiazide  (BENICAR  HCT) 40-25 MG tablet Take 1 tablet by mouth every morning. 11/16/24     olmesartan -hydrochlorothiazide  (BENICAR  HCT) 40-25 MG tablet TAKE ONE TABLET BY MOUTH EVERY MORNING 11/16/24     sodium bicarbonate  650 MG tablet Take 1 tablet (650 mg total) by mouth in the morning and at bedtime. 08/13/24     Vitamin D , Ergocalciferol , 50000 units CAPS Take 1 capsule by mouth every 7 (seven) days. 08/26/24       Allergies: Hydrocodone     Review of Systems  Constitutional:  Negative for chills  and fever.  HENT:  Negative for ear pain and sore throat.   Eyes:  Negative for pain and visual disturbance.  Respiratory:  Negative for cough and shortness of breath.   Cardiovascular:  Negative for chest pain and palpitations.  Gastrointestinal:  Negative for abdominal pain and vomiting.  Genitourinary:  Negative for dysuria and hematuria.  Musculoskeletal:  Negative for arthralgias and back pain.       Admits right knee and shoulder pain   Skin:  Negative for color change and rash.  Neurological:  Negative for seizures and syncope.  All other systems reviewed and are negative.   Updated Vital Signs BP 136/78   Pulse 67   Temp 98.3 F (36.8 C) (Oral)   Resp 18   Ht 5' 2 (1.575 m)   Wt 86.2 kg   SpO2  96%   BMI 34.75 kg/m   Physical Exam Vitals and nursing note reviewed.  Constitutional:      General: Vanessa Anderson is not in acute distress.    Appearance: Normal appearance. Vanessa Anderson is well-developed. Vanessa Anderson is not ill-appearing.  HENT:     Head: Normocephalic and atraumatic.  Eyes:     Conjunctiva/sclera: Conjunctivae normal.  Cardiovascular:     Rate and Rhythm: Normal rate and regular rhythm.     Heart sounds: No murmur heard. Pulmonary:     Effort: Pulmonary effort is normal. No respiratory distress.     Breath sounds: Normal breath sounds.  Abdominal:     Palpations: Abdomen is soft.     Tenderness: There is no abdominal tenderness.  Musculoskeletal:        General: Tenderness present. No swelling.     Cervical back: Neck supple.     Comments: Right humerus tenderness to palpation, right knee tenderness to palpation, full ROM of knee   Skin:    General: Skin is warm and dry.     Capillary Refill: Capillary refill takes less than 2 seconds.  Neurological:     General: No focal deficit present.     Mental Status: Vanessa Anderson is alert.  Psychiatric:        Mood and Affect: Mood normal.     (all labs ordered are listed, but only abnormal results are displayed) Labs Reviewed  BASIC METABOLIC PANEL WITH GFR  CBC WITH DIFFERENTIAL/PLATELET  URINALYSIS, ROUTINE W REFLEX MICROSCOPIC  CK  TROPONIN T, HIGH SENSITIVITY    EKG: EKG Interpretation Date/Time:  Thursday December 03 2024 21:52:21 EST Ventricular Rate:  66 PR Interval:  140 QRS Duration:  99 QT Interval:  410 QTC Calculation: 430 R Axis:   89  Text Interpretation: Sinus rhythm Borderline right axis deviation Nonspecific lateral ST changes Compared with prior EKG from 09/30/2023 Confirmed by Gennaro Bouchard ([id number]) on 12/03/2024 10:10:08 PM  Radiology: [facility] Shoulder Right Result Date: 12/03/2024 EXAM: 1 VIEW(S) XRAY OF THE RIGHT SHOULDER 12/03/2024 09:30:56 PM COMPARISON: None available. CLINICAL HISTORY: Fall. FINDINGS: BONES AND  JOINTS: Acute comminuted nondisplaced right humeral head and neck fracture. The [facility] joint is unremarkable. SOFT TISSUES: No abnormal calcifications. Visualized lung is unremarkable. IMPRESSION: 1. Acute comminuted nondisplaced right humeral head and neck fracture. Electronically signed by: Morgane Naveau MD 12/03/2024 09:36 PM EST RP Workstation: [id number]   DG Chest Port 1 View Result Date: 12/03/2024 EXAM: 1 VIEW(S) XRAY OF THE CHEST 12/03/2024 09:11:00 PM COMPARISON: 11/26/2013 CLINICAL HISTORY: Fall. FINDINGS: LUNGS AND PLEURA: Elevated right hemidiaphragm. No focal pulmonary opacity. No pleural effusion. No pneumothorax. HEART AND MEDIASTINUM: No acute  abnormality of the cardiac and mediastinal silhouettes. BONES AND SOFT TISSUES: Thoracic degenerative changes. Degenerative changes of the shoulders. No acute osseous abnormality. IMPRESSION: 1. No acute cardiopulmonary abnormality. 2. Elevated right hemidiaphragm. Electronically signed by: Morgane Naveau MD 12/03/2024 09:14 PM EST RP Workstation: [id number]   DG Knee Complete 4 Views Right Result Date: 12/03/2024 EXAM: 4 VIEW(S) XRAY OF THE RIGHT KNEE 12/03/2024 09:09:00 PM COMPARISON: None available. CLINICAL HISTORY: 809823 Fall 809823 Fall. Right knee pain after a fall. FINDINGS: BONES AND JOINTS: No evidence of acute fracture or dislocation in the right knee. No focal bone lesion or bone destruction. Degenerative changes in the right knee with medial compartment narrowing and osteophyte formation. No significant effusion. SOFT TISSUES: Unremarkable. IMPRESSION: 1. No acute fracture or dislocation. 2. Moderate degenerative changes in the right knee with medial compartment narrowing and osteophyte formation. Electronically signed by: Elsie Gravely MD 12/03/2024 09:12 PM EST RP Workstation: [id number]     Procedures   Medications Ordered in the ED  lactated ringers  bolus 500 mL (has no administration in time range)  ondansetron  (ZOFRAN )  injection 4 mg (has no administration in time range)                                    Medical Decision Making Social determinants of health: Lives alone  Cardiac monitor interpretation: Sinus rhythm, no ectopy  Patient here for shoulder pain after a fall.  Vanessa Anderson is found have a humerus fracture.  Was placed in a sling.  Was given a dose of pain meds in the ER and was feeling much better.  Vanessa Anderson went to get out of the bed to be discharged home and got very lightheaded and near syncopal.  Had an episode of vomiting and blood pressure was soft in the 90s.  Will give IV fluids and obtain lab work and monitor the patient at this time.  Patient signed out to oncoming provider at 11 PM pending remainder of workup and ultimate disposition.  Problems Addressed: Acute pain of right knee: acute illness or injury Closed nondisplaced fracture of surgical neck of right humerus, unspecified fracture morphology, initial encounter: acute illness or injury Fall, initial encounter: acute illness or injury Near syncope: acute illness or injury  Amount and/or Complexity of Data Reviewed Independent Historian: caregiver    Details: Daughter at bedside helps write history-states patient went outside, lives alone and slipped on the ice and fell around 6 PM and daughter got the call around 8 PM  External Data Reviewed: notes.    Details: 6 19-25-ER records reviewed and patient seen for leg pain Labs: ordered. Decision-making details documented in ED Course.    Details: Ordered and pending Radiology: ordered and independent interpretation performed. Decision-making details documented in ED Course.    Details: Ordered and interpreted by me independently of radiology Chest x-ray: Shows no acute abnormality Right knee x-ray: Shows no acute abnormality Right shoulder x-ray: Shows humerus fracture ECG/medicine tests: ordered and independent interpretation performed. Decision-making details documented in ED Course.     Details: Ordered and interpreted by me in the absence of cardiology and shows sinus rhythm, no STEMI, or significant change when compared to prior EKG  Risk OTC drugs. Prescription drug management. Drug therapy requiring intensive monitoring for toxicity. Diagnosis or treatment significantly limited by social determinants of health.     Final diagnoses:  Closed nondisplaced fracture of surgical neck of right humerus, unspecified  fracture morphology, initial encounter  Acute pain of right knee  Fall, initial encounter  Near syncope    ED Discharge Orders     None          Gennaro Duwaine CROME, DO 12/03/24 2305  "

## 2024-12-04 ENCOUNTER — Emergency Department (HOSPITAL_COMMUNITY)

## 2024-12-04 ENCOUNTER — Other Ambulatory Visit (HOSPITAL_COMMUNITY): Payer: Self-pay

## 2024-12-04 DIAGNOSIS — S42214A Unspecified nondisplaced fracture of surgical neck of right humerus, initial encounter for closed fracture: Secondary | ICD-10-CM | POA: Diagnosis not present

## 2024-12-04 LAB — URINALYSIS, ROUTINE W REFLEX MICROSCOPIC
Bilirubin Urine: NEGATIVE
Glucose, UA: NEGATIVE mg/dL
Ketones, ur: NEGATIVE mg/dL
Nitrite: NEGATIVE
Protein, ur: 100 mg/dL — AB
Specific Gravity, Urine: 1.024 (ref 1.005–1.030)
pH: 5 (ref 5.0–8.0)

## 2024-12-04 LAB — BASIC METABOLIC PANEL WITH GFR
Anion gap: 15 (ref 5–15)
BUN: 31 mg/dL — ABNORMAL HIGH (ref 8–23)
CO2: 18 mmol/L — ABNORMAL LOW (ref 22–32)
Calcium: 9.4 mg/dL (ref 8.9–10.3)
Chloride: 109 mmol/L (ref 98–111)
Creatinine, Ser: 1.89 mg/dL — ABNORMAL HIGH (ref 0.44–1.00)
GFR, Estimated: 27 mL/min — ABNORMAL LOW
Glucose, Bld: 136 mg/dL — ABNORMAL HIGH (ref 70–99)
Potassium: 5.1 mmol/L (ref 3.5–5.1)
Sodium: 141 mmol/L (ref 135–145)

## 2024-12-04 LAB — CBC
HCT: 37 % (ref 36.0–46.0)
Hemoglobin: 11.7 g/dL — ABNORMAL LOW (ref 12.0–15.0)
MCH: 29.7 pg (ref 26.0–34.0)
MCHC: 31.6 g/dL (ref 30.0–36.0)
MCV: 93.9 fL (ref 80.0–100.0)
Platelets: 300 10*3/uL (ref 150–400)
RBC: 3.94 MIL/uL (ref 3.87–5.11)
RDW: 14.3 % (ref 11.5–15.5)
WBC: 18.9 10*3/uL — ABNORMAL HIGH (ref 4.0–10.5)
nRBC: 0 % (ref 0.0–0.2)

## 2024-12-04 LAB — PHOSPHORUS: Phosphorus: 2.9 mg/dL (ref 2.5–4.6)

## 2024-12-04 LAB — TROPONIN T, HIGH SENSITIVITY: Troponin T High Sensitivity: 13 ng/L (ref 0–19)

## 2024-12-04 LAB — MAGNESIUM: Magnesium: 2.5 mg/dL — ABNORMAL HIGH (ref 1.7–2.4)

## 2024-12-04 MED ORDER — SODIUM CHLORIDE 0.9 % IV SOLN: INTRAVENOUS | Status: AC

## 2024-12-04 MED ORDER — ATORVASTATIN CALCIUM 10 MG PO TABS
10.0000 mg | ORAL_TABLET | Freq: Every morning | ORAL | Status: DC
  Administered 2024-12-04 – 2024-12-07 (×4): 10 mg via ORAL
  Filled 2024-12-04 (×4): qty 1

## 2024-12-04 MED ORDER — OXYCODONE HCL 5 MG PO TABS
5.0000 mg | ORAL_TABLET | ORAL | Status: AC | PRN
  Administered 2024-12-04 – 2024-12-05 (×5): 5 mg via ORAL
  Administered 2024-12-05: 10 mg via ORAL
  Filled 2024-12-04 (×6): qty 1

## 2024-12-04 MED ORDER — SODIUM CHLORIDE 0.9 % IV SOLN
1.0000 g | INTRAVENOUS | Status: DC
  Administered 2024-12-04 – 2024-12-06 (×3): 1 g via INTRAVENOUS
  Filled 2024-12-04 (×3): qty 10

## 2024-12-04 MED ORDER — ACETAMINOPHEN 325 MG PO TABS: 650.0000 mg | ORAL_TABLET | Freq: Four times a day (QID) | ORAL | Status: DC | PRN

## 2024-12-04 MED ORDER — PROCHLORPERAZINE EDISYLATE 10 MG/2ML IJ SOLN: 5.0000 mg | Freq: Four times a day (QID) | INTRAMUSCULAR | Status: DC | PRN

## 2024-12-04 MED ORDER — LEVOTHYROXINE SODIUM 25 MCG PO TABS
25.0000 ug | ORAL_TABLET | Freq: Every day | ORAL | Status: DC
  Administered 2024-12-04 – 2024-12-07 (×4): 25 ug via ORAL
  Filled 2024-12-04 (×4): qty 1

## 2024-12-04 MED ORDER — MELATONIN 3 MG PO TABS: 6.0000 mg | ORAL_TABLET | Freq: Every evening | ORAL | Status: DC | PRN

## 2024-12-04 MED ORDER — SODIUM CHLORIDE 0.9 % IV SOLN
1.0000 g | Freq: Once | INTRAVENOUS | Status: AC
  Administered 2024-12-04: 1 g via INTRAVENOUS
  Filled 2024-12-04: qty 10

## 2024-12-04 MED ORDER — HEPARIN SODIUM (PORCINE) 5000 UNIT/ML IJ SOLN
5000.0000 [IU] | Freq: Three times a day (TID) | INTRAMUSCULAR | Status: DC
  Administered 2024-12-04 – 2024-12-06 (×5): 5000 [IU] via SUBCUTANEOUS
  Filled 2024-12-04 (×5): qty 1

## 2024-12-04 MED ORDER — MORPHINE SULFATE (PF) 2 MG/ML IV SOLN: 2.0000 mg | INTRAVENOUS | Status: DC | PRN

## 2024-12-04 NOTE — Plan of Care (Signed)
" °  Problem: Acute Rehab PT Goals(only PT should resolve) Goal: Pt Will Go Supine/Side To Sit Outcome: Progressing Flowsheets (Taken 12/04/2024 1622) Pt will go Supine/Side to Sit: with supervision Goal: Patient Will Transfer Sit To/From Stand Outcome: Progressing Flowsheets (Taken 12/04/2024 1622) Patient will transfer sit to/from stand: with supervision Goal: Pt Will Transfer Bed To Chair/Chair To Bed Outcome: Progressing Flowsheets (Taken 12/04/2024 1622) Pt will Transfer Bed to Chair/Chair to Bed: with supervision Goal: Pt Will Ambulate Outcome: Progressing Flowsheets (Taken 12/04/2024 1622) Pt will Ambulate:  100 feet  with least restrictive assistive device  with supervision   4:23 PM, 12/04/24 Rosaria Settler, PT, DPT [place] with [facility]  "

## 2024-12-04 NOTE — Progress Notes (Signed)
 Sat in chair for several hours this afternoon.  Pain in right shoulder is around 3 when not moving arm increases to 9 or ten with movement.  Arm and hand warm with no numbness or tingling.  Has received oxycodone  5mg  twice.  Son and daughter-in-law at bedside and they stated that patient will go home with them

## 2024-12-04 NOTE — H&P (Signed)
 " History and Physical  Vanessa Anderson FMW:[id number] DOB: 1946-01-02 DOA: 12/03/2024  Referring physician: Dr. Haze, EDP  PCP: [name], MD  Outpatient Specialists: Cardiology, gynecology. Patient coming from: Home, lives alone.  Chief Complaint: Fall on ice.  HPI: Vanessa Anderson is a 79 y.o. female with medical history significant for paroxysmal atrial fibrillation on Eliquis , hyperlipidemia, hypertension, hypothyroidism, history of complex atypical endometrial hyperplasia followed by gynecology, presents to the ER after a fall on black ice this evening.  The patient was outside feeding the birds.  At the moment she walked away to return to the house, she slipped on ice.  She lives alone and was outside about 30 minutes before family found her.  She was in her usual state of health prior to this.  Denies loss of consciousness or hitting her head.  Complaining of right shoulder pain.  EMS was activated.  Brought to the ER for further evaluation.  In the ER, vital signs are stable.  Right shoulder x-ray revealed acute comminuted nondisplaced right humeral head and neck fracture.  Sling was placed in the ER.  Will need to follow-up with orthopedic surgery outpatient.  While in the ER, the patient had a syncopal episode, then a presyncopal episode afterward.  No evidence of acute ischemia on twelve-lead EKG.  High-sensitivity troponin 12, 13.  UA returned positive for pyuria.  Due to concern for possible UTI the patient was started on Rocephin  empirically.  Frail-appearing with high risk of falls on blood thinners.  Family did not feel comfortable taking her home tonight.  Admitted by [facility], hospitalist service.  ED Course: Temperature 99.3.  Blood pressure 141/59, pulse 60, respiratory 18, O2 saturation 98% on room air.  Review of Systems: Review of systems as noted in the HPI. All other systems reviewed and are negative.   Past Medical History:  Diagnosis Date   Chronic kidney disease     Essential hypertension    Fibromyalgia    Hyperlipidemia    Paroxysmal atrial fibrillation [place] [facility])    Past Surgical History:  Procedure Laterality Date   CATARACT EXTRACTION Bilateral 01/2022   CESAREAN SECTION     COLONOSCOPY N/A 05/28/2014   Procedure: COLONOSCOPY;  Surgeon: [name], MD;  Location: [facility] ENDO SUITE;  Service: Endoscopy;  Laterality: N/A;  8:30 AM   HYSTEROSCOPY WITH D & C N/A 11/07/2016   Procedure: DILATATION AND CURETTAGE /HYSTEROSCOPY;  Surgeon: [name], MD;  Location: [facility] ORS;  Service: Gynecology;  Laterality: N/A;   TOOTH EXTRACTION  09/20/2022   3 wisdom teeth    Social History:  reports that she has never smoked. She has never used smokeless tobacco. She reports that she does not drink alcohol and does not use drugs.   Allergies[1]  Family History  Problem Relation Age of Onset   Heart attack Father       Prior to Admission medications  Medication Sig Start Date End Date Taking? Authorizing Provider  oxyCODONE -acetaminophen  (PERCOCET/ROXICET) 5-325 MG tablet Take 1 tablet by mouth every 6 (six) hours as needed for severe pain (pain score 7-10). 12/03/24  Yes Kammerer, Megan L, DO  oxyCODONE -acetaminophen  (PERCOCET/ROXICET) 5-325 MG tablet Take 1 tablet by mouth every 6 (six) hours as needed for up to 4 days for severe pain (pain score 7-10). 12/03/24 12/07/24 Yes Kammerer, Megan L, DO  amiodarone  (PACERONE ) 200 MG tablet Take 1 tablet (200 mg total) by mouth every morning. 08/06/24   [name], MD  apixaban  (ELIQUIS ) 5 MG TABS tablet Take 1 tablet (5 mg total) by mouth 2 (two) times daily. 09/14/24     atorvastatin  (LIPITOR) 10 MG tablet Take 1 tablet (10 mg total) by mouth in the morning. 08/05/24     brimonidine  (ALPHAGAN ) 0.2 % ophthalmic solution Place 1 drop into both eyes 2 (two) times daily. 08/07/23     Calcium  500-2.5 MG-MCG CHEW Chew 1 tablet by mouth every morning. 08/30/21   [provider]  Calcium  500-2.5 MG-MCG CHEW  Chew 1 tablet by mouth in the morning. 08/05/24     Calcium  500-2.5 MG-MCG CHEW Chew 1 tablet by mouth every morning 08/24/24     Calcium  Carbonate-Vitamin D  (CALCIUM  500/VITAMIN D  PO) Take 600 mg by mouth.    [provider]  cephALEXin  (KEFLEX ) 500 MG capsule Take 1 capsule (500 mg total) by mouth every 12 (twelve) hours. 10/07/23     cetirizine  (ZYRTEC ) 10 MG tablet Take 1 tablet (10 mg total) by mouth every morning. 08/05/24     cholecalciferol  (VITAMIN D3) 25 MCG (1000 UNIT) tablet Take 1,000 Units by mouth daily.    [provider]  diclofenac  Sodium (VOLTAREN ) 1 % GEL Apply 4 g topically 4 (four) times daily. Patient not taking: Reported on 12/04/2024 04/23/24   [name], DO  diltiazem  (DILACOR XR ) 120 MG 24 hr capsule Take 1 capsule (120 mg total) by mouth daily. 11/16/24     diltiazem  (DILACOR XR ) 120 MG 24 hr capsule Take 1 capsule (120 mg total) by mouth daily. 11/16/24     latanoprost  (XALATAN ) 0.005 % ophthalmic solution Place 1 drop into both eyes every evening. 12/24/23   Grissom, Nicholas B, MD  levothyroxine  (SYNTHROID ) 25 MCG tablet Take 1 tablet (25 mcg total) by mouth daily before breakfast. 05/12/24   [name], NP  megestrol  (MEGACE ) 20 MG tablet Take 1 tablet (20 mg total) by mouth 2 (two) times daily. 08/28/24   [name], MD  olmesartan -hydrochlorothiazide  (BENICAR  HCT) 40-25 MG tablet Take 1 tablet by mouth every morning. 11/16/24     olmesartan -hydrochlorothiazide  (BENICAR  HCT) 40-25 MG tablet TAKE ONE TABLET BY MOUTH EVERY MORNING 11/16/24     sodium bicarbonate  650 MG tablet Take 1 tablet (650 mg total) by mouth in the morning and at bedtime. 08/13/24     Vitamin D , Ergocalciferol , 50000 units CAPS Take 1 capsule by mouth every 7 (seven) days. 08/26/24       Physical Exam: BP (!) 148/59   Pulse 66   Temp 99.3 F (37.4 C) (Oral)   Resp 18   Ht 5' 2 (1.575 m)   Wt 86.2 kg   SpO2 98%   BMI 34.75 kg/m   General: 79 y.o. year-old female  well developed well nourished in no acute distress.  Alert and oriented x3. Cardiovascular: Regular rate and rhythm with no rubs or gallops.  No thyromegaly or JVD noted.  No lower extremity edema. 2/4 pulses in all 4 extremities. Respiratory: Clear to auscultation with no wheezes or rales. Good inspiratory effort. Abdomen: Soft nontender nondistended with normal bowel sounds x4 quadrants. Muskuloskeletal: No cyanosis, clubbing or edema noted bilaterally.  Right arm in sling. Neuro: CN II-XII intact, strength, sensation, reflexes Skin: No ulcerative lesions noted or rashes Psychiatry: Judgement and insight appear normal. Mood is appropriate for condition and setting          Labs on Admission:  Basic Metabolic Panel: Recent Labs  Lab 12/03/24 2305 12/04/24 0207  NA 139  --   K 4.8  --   CL 107  --   CO2 17*  --   GLUCOSE 160*  --   BUN 27*  --   CREATININE 1.71*  --   CALCIUM  9.3  --   MG  --  2.5*  PHOS  --  2.9   Liver Function Tests: No results for input(s): AST, ALT, ALKPHOS, BILITOT, PROT, ALBUMIN in the last 168 hours. No results for input(s): LIPASE, AMYLASE in the last 168 hours. No results for input(s): AMMONIA in the last 168 hours. CBC: Recent Labs  Lab 12/03/24 2305  WBC 22.4*  NEUTROABS 19.6*  HGB 12.1  HCT 38.8  MCV 95.8  PLT 267   Cardiac Enzymes: Recent Labs  Lab 12/03/24 2305  CKTOTAL 340*    BNP (last 3 results) No results for input(s): BNP in the last 8760 hours.  ProBNP (last 3 results) No results for input(s): PROBNP in the last 8760 hours.  CBG: No results for input(s): GLUCAP in the last 168 hours.  Radiological Exams on Admission: CT HEAD WO CONTRAST ( ) Result Date: 12/04/2024 EXAM: CT HEAD WITHOUT CONTRAST 12/04/2024 01:18:37 AM TECHNIQUE: CT of the head was performed without the administration of intravenous contrast. Automated exposure control, iterative reconstruction, and/or weight based adjustment of  the mA/kV was utilized to reduce the radiation dose to as low as reasonably achievable. COMPARISON: None available. CLINICAL HISTORY: Mental status change, unknown cause; Syncope/presyncope, cerebrovascular cause suspected FINDINGS: BRAIN AND VENTRICLES: No acute hemorrhage. No evidence of acute infarct. No extra-axial collection. No mass effect or midline shift. ORBITS: No acute abnormality. SINUSES: No acute abnormality. SOFT TISSUES AND SKULL: No acute soft tissue abnormality. No skull fracture. IMPRESSION: 1. No acute intracranial abnormality. Electronically signed by: Oneil Devonshire MD 12/04/2024 01:21 AM EST RP Workstation: [id number]   DG Shoulder Right Result Date: 12/03/2024 EXAM: 1 VIEW(S) XRAY OF THE RIGHT SHOULDER 12/03/2024 09:30:56 PM COMPARISON: None available. CLINICAL HISTORY: Fall. FINDINGS: BONES AND JOINTS: Acute comminuted nondisplaced right humeral head and neck fracture. The [facility] joint is unremarkable. SOFT TISSUES: No abnormal calcifications. Visualized lung is unremarkable. IMPRESSION: 1. Acute comminuted nondisplaced right humeral head and neck fracture. Electronically signed by: Morgane Naveau MD 12/03/2024 09:36 PM EST RP Workstation: [id number]   DG Chest Port 1 View Result Date: 12/03/2024 EXAM: 1 VIEW(S) XRAY OF THE CHEST 12/03/2024 09:11:00 PM COMPARISON: 11/26/2013 CLINICAL HISTORY: Fall. FINDINGS: LUNGS AND PLEURA: Elevated right hemidiaphragm. No focal pulmonary opacity. No pleural effusion. No pneumothorax. HEART AND MEDIASTINUM: No acute abnormality of the cardiac and mediastinal silhouettes. BONES AND SOFT TISSUES: Thoracic degenerative changes. Degenerative changes of the shoulders. No acute osseous abnormality. IMPRESSION: 1. No acute cardiopulmonary abnormality. 2. Elevated right hemidiaphragm. Electronically signed by: Morgane Naveau MD 12/03/2024 09:14 PM EST RP Workstation: [id number]   DG Knee Complete 4 Views Right Result Date: 12/03/2024 EXAM: 4 VIEW(S) XRAY OF THE  RIGHT KNEE 12/03/2024 09:09:00 PM COMPARISON: None available. CLINICAL HISTORY: 809823 Fall 809823 Fall. Right knee pain after a fall. FINDINGS: BONES AND JOINTS: No evidence of acute fracture or dislocation in the right knee. No focal bone lesion or bone destruction. Degenerative changes in the right knee with medial compartment narrowing and osteophyte formation. No significant effusion. SOFT TISSUES: Unremarkable. IMPRESSION: 1. No acute fracture or dislocation. 2. Moderate degenerative changes in the right knee with medial compartment narrowing and osteophyte formation. Electronically signed by: Elsie Gravely MD 12/03/2024 09:12 PM EST RP Workstation: [id number]  EKG: I independently viewed the EKG done and my findings are as followed: Sinus rhythm rate of 66.  QTc 430.  Assessment/Plan Present on Admission:  Closed nondisplaced fracture of surgical neck of right humerus, unspecified fracture morphology, initial encounter  Principal Problem:   Closed nondisplaced fracture of surgical neck of right humerus, unspecified fracture morphology, initial encounter  Closed nondisplaced fracture of surgical neck of right humerus, post fall on black ice, POA Continue pain control Continue sling Follow-up with orthopedic surgery outpatient.  Syncope/presyncope Noncontrast head CT was nonacute. Orthostatic vital signs x 1 Gentle IV fluid hydration NS at 50 cc/h x 1 day PT OT assessment Fall precautions  Persistent atrial fibrillation on Eliquis  Home Eliquis  currently on hold due to risk of bleeding post right upper extremity trauma. Resume home Eliquis  when appropriate. Monitor on telemetry.  Hyperlipidemia Resume home statin  Prediabetes Diet controlled Last hemoglobin A1c 5.7 on 08/18/2024  Hypothyroidism Resume home levothyroxine   Generalized weakness PT OT evaluation Fall precautions  History of complex atypical endometrial hyperplasia followed by gynecology Resume home  regimen once home meds are reconciled Recommend follow-up with gynecology outpatient   Time: 75 minutes.   DVT prophylaxis: SCDs.  Code Status: Full code.  Family Communication: None at bedside.  Disposition Plan: Admitted to telemetry unit.  Consults called: None.  Admission status: Inpatient status.   Status is: Inpatient The patient requires at least 2 midnights for further evaluation and treatment of present condition.   Terry LOISE Hurst MD [facility] Pager [phone]  If 7PM-7AM, please contact night-coverage [email or web address] Password [email or web address]  12/04/2024, 5:29 AM      [1]  Allergies Allergen Reactions   Hydrocodone  Itching   "

## 2024-12-04 NOTE — ED Provider Notes (Signed)
 Signed out to me by Dr. Gennaro.  Patient with slip and fall on the ice earlier tonight, suffered arm injury.  Patient had a syncopal episode here in the ED and has continued to have waxing and waning presyncope episodes despite been given IV fluids.  Additional head CT ordered, no abnormality.  Urinalysis obtained, does have signs of infection.  Culture has been sent, administer Rocephin .  Family requesting admission, will contact hospitalist.   Haze Lonni PARAS, MD 12/04/24 0157

## 2024-12-04 NOTE — Evaluation (Signed)
 Physical Therapy Evaluation Patient Details Name: Vanessa Anderson MRN: [id number] DOB: 09/12/1946 Today's Date: 12/04/2024  History of Present Illness  Vanessa Anderson is a 79 y.o. female with medical history significant for paroxysmal atrial fibrillation on Eliquis , hyperlipidemia, hypertension, hypothyroidism, history of complex atypical endometrial hyperplasia followed by gynecology, presents to the ER after a fall on black ice this evening.  The patient was outside feeding the birds.  At the moment she walked away to return to the house, she slipped on ice.  She lives alone and was outside about 30 minutes before family found her.  She was in her usual state of health prior to this.  Denies loss of consciousness or hitting her head.  Complaining of right shoulder pain.  EMS was activated.  Brought to the ER for further evaluation.   Clinical Impression  Patient was agreeable to PT evaluation. Patient reports at her baseline, she is independent with ambulation and ADL/iADLs. This date, patient requires min assist with bed mobility and gait without AD. With [facility], patient requires CGA to ambulate household distances. Patient demonstrates some general weakness throughout BLE and LUE. She has RUE in sling upon arrival and reports moderate pain at rest. Patient reports she plans to go stay with her son and daughter in law upon discharge and believes they would be able to provide the necessary support needed. Patient tolerates sitting in chair at EOS, alarm set, and all needs met. Patient will benefit from continued skilled physical therapy acutely and PT or OT services in recommended venue in order to address current RUE deficits.        If plan is discharge home, recommend the following: A little help with walking and/or transfers;A lot of help with bathing/dressing/bathroom;Assistance with cooking/housework;Assist for transportation;Help with stairs or ramp for entrance   Can travel by private  vehicle        Equipment Recommendations None recommended by PT  Recommendations for Other Services       Functional Status Assessment Patient has had a recent decline in their functional status and demonstrates the ability to make significant improvements in function in a reasonable and predictable amount of time.     Precautions / Restrictions Precautions Precautions: Fall Recall of Precautions/Restrictions: Intact Restrictions Weight Bearing Restrictions Per Provider Order: No      Mobility  Bed Mobility Overal bed mobility: Needs Assistance Bed Mobility: Supine to Sit     Supine to sit: Min assist     General bed mobility comments: pt able to initiate transfer with verbal cues, can get BLE to EOB but requires assist to elevate trunk via pull to sit with LUE, labored movement overall    Transfers Overall transfer level: Needs assistance Equipment used: 1 person hand held assist, Straight cane Transfers: Sit to/from Stand, Bed to chair/wheelchair/BSC Sit to Stand: Contact guard assist   Step pivot transfers: Contact guard assist, Min assist       General transfer comment: STS from bed without AD, pt able to use LUE to push up from surface, pt grabs for PT hand once standing, utilizes 1 person hand held assist for step pivot, slow movement but no overt LOB    Ambulation/Gait Ambulation/Gait assistance: Contact guard assist Gait Distance (Feet): 50 Feet Assistive device: Straight cane Gait Pattern/deviations: Step-through pattern, Decreased step length - right, Decreased step length - left, Decreased stride length Gait velocity: Dec     General Gait Details: pt ambulates within room with [facility] and CGA for  safety, slow velocity and movement throughout but no overt LOB or unsteadiness, pt reports no dizziness or lightheadedness  Stairs            Wheelchair Mobility     Tilt Bed    Modified Rankin (Stroke Patients Only)       Balance Overall  balance assessment: Needs assistance Sitting-balance support: No upper extremity supported, Feet supported Sitting balance-Leahy Scale: Good Sitting balance - Comments: Seated EOB   Standing balance support: No upper extremity supported, Single extremity supported, During functional activity Standing balance-Leahy Scale: Fair Standing balance comment: w/ SPC         Pertinent Vitals/Pain Pain Assessment Pain Assessment: 0-10 Pain Score: 6  Pain Location: R shoulder Pain Descriptors / Indicators: Sharp Pain Intervention(s): Limited activity within patient's tolerance, Monitored during session, Repositioned    Home Living Family/patient expects to be discharged to:: Private residence Living Arrangements: Alone Available Help at Discharge: Family;Available PRN/intermittently Type of Home: House Home Access: Level entry       Home Layout: One level Home Equipment: Cane - single point Additional Comments: The above is her home history but plans are to go stay with son and daughter in law at discharge, reports DIL [facility] and will be available to assist 24/7 if needed    Prior Function Prior Level of Function : Independent/Modified Independent;Driving             Mobility Comments: Independent community ambulator, does not drive ADLs Comments: Independent     Extremity/Trunk Assessment   Upper Extremity Assessment Upper Extremity Assessment: RUE deficits/detail;LUE deficits/detail RUE Deficits / Details: RUE immobilized in sling upon arrival RUE: Unable to fully assess due to pain;Shoulder pain at rest RUE Coordination: decreased gross motor LUE Deficits / Details: L shoulder ROM limited ~140 degrees but WFL, MMT 4-/5 at best LUE Coordination: decreased gross motor    Lower Extremity Assessment Lower Extremity Assessment: Generalized weakness (ankle DF MMT 4/5, hip flexion 3+/5, all bilaterally)    Cervical / Trunk Assessment Cervical / Trunk Assessment: Kyphotic   Communication   Communication Communication: No apparent difficulties    Cognition Arousal: Alert Behavior During Therapy: WFL for tasks assessed/performed         Following commands: Intact       Cueing Cueing Techniques: Verbal cues, Visual cues     General Comments      Exercises     Assessment/Plan    PT Assessment All further PT needs can be met in the next venue of care;Patient needs continued PT services  PT Problem List Decreased strength;Decreased range of motion;Decreased activity tolerance;Decreased mobility;Decreased balance;Pain       PT Treatment Interventions DME instruction;Balance training;Gait training;Functional mobility training;Therapeutic activities;Therapeutic exercise;Patient/family education    PT Goals (Current goals can be found in the Care Plan section)  Acute Rehab PT Goals Patient Stated Goal: Return to son's house with [facility] services PT Goal Formulation: With patient Time For Goal Achievement: 12/11/24 Potential to Achieve Goals: Good    Frequency Min 3X/week     Co-evaluation               AM-PAC PT 6 Clicks Mobility  Outcome Measure Help needed turning from your back to your side while in a flat bed without using bedrails?: A Little Help needed moving from lying on your back to sitting on the side of a flat bed without using bedrails?: A Little Help needed moving to and from a bed to a chair (including  a wheelchair)?: A Little Help needed standing up from a chair using your arms (e.g., wheelchair or bedside chair)?: A Little Help needed to walk in hospital room?: A Little Help needed climbing 3-5 steps with a railing? : A Lot 6 Click Score: 17    End of Session Equipment Utilized During Treatment: Gait belt Activity Tolerance: Patient tolerated treatment well Patient left: in chair;with call bell/phone within reach;with chair alarm set   PT Visit Diagnosis: Unsteadiness on feet (R26.81);Muscle weakness (generalized)  (M62.81);History of falling (Z91.81);Other abnormalities of gait and mobility (R26.89);Pain Pain - Right/Left: Right Pain - part of body: Shoulder    Time: 1438-1500 PT Time Calculation (min) (ACUTE ONLY): 22 min   Charges:   PT Evaluation $PT Eval Low Complexity: 1 Low   PT General Charges $$ ACUTE PT VISIT: 1 Visit         4:20 PM, 12/04/24 Athaliah Baumbach Powell-Butler, PT, DPT [place] with [facility]

## 2024-12-04 NOTE — Plan of Care (Signed)
 Courtesy visit- No billing  Patient is seen and examined today morning.  She is admitted for evaluation of fall, syncope, right humerus neck fracture.  Patient does have pain over right shoulder with movement.  Did not get out of bed.  Plan- Given presentation of syncope, will get echocardiogram, orthostatic vitals.  Continue telemetry for history of A-fib.  PT OT evaluation for discharge planning.  Patient lives alone and will need help at home.  Further management pending clinical course.

## 2024-12-04 NOTE — Plan of Care (Signed)

## 2024-12-05 ENCOUNTER — Inpatient Hospital Stay (HOSPITAL_COMMUNITY)

## 2024-12-05 ENCOUNTER — Other Ambulatory Visit (HOSPITAL_COMMUNITY): Payer: Self-pay | Admitting: *Deleted

## 2024-12-05 DIAGNOSIS — R55 Syncope and collapse: Secondary | ICD-10-CM

## 2024-12-05 LAB — ECHOCARDIOGRAM COMPLETE
AR max vel: 1.53 cm2
AV Area VTI: 1.39 cm2
AV Area mean vel: 1.5 cm2
AV Mean grad: 14 mmHg
AV Peak grad: 29.2 mmHg
Ao pk vel: 2.7 m/s
Area-P 1/2: 3.21 cm2
Height: 62 in
S' Lateral: 2.3 cm
Weight: 3040 [oz_av]

## 2024-12-05 LAB — BASIC METABOLIC PANEL WITH GFR
Anion gap: 9 (ref 5–15)
BUN: 40 mg/dL — ABNORMAL HIGH (ref 8–23)
CO2: 22 mmol/L (ref 22–32)
Calcium: 8.7 mg/dL — ABNORMAL LOW (ref 8.9–10.3)
Chloride: 108 mmol/L (ref 98–111)
Creatinine, Ser: 2.62 mg/dL — ABNORMAL HIGH (ref 0.44–1.00)
GFR, Estimated: 18 mL/min — ABNORMAL LOW
Glucose, Bld: 96 mg/dL (ref 70–99)
Potassium: 5.2 mmol/L — ABNORMAL HIGH (ref 3.5–5.1)
Sodium: 139 mmol/L (ref 135–145)

## 2024-12-05 LAB — CBC
HCT: 30 % — ABNORMAL LOW (ref 36.0–46.0)
Hemoglobin: 9.5 g/dL — ABNORMAL LOW (ref 12.0–15.0)
MCH: 30.3 pg (ref 26.0–34.0)
MCHC: 31.7 g/dL (ref 30.0–36.0)
MCV: 95.5 fL (ref 80.0–100.0)
Platelets: 212 10*3/uL (ref 150–400)
RBC: 3.14 MIL/uL — ABNORMAL LOW (ref 3.87–5.11)
RDW: 14.6 % (ref 11.5–15.5)
WBC: 14.6 10*3/uL — ABNORMAL HIGH (ref 4.0–10.5)
nRBC: 0 % (ref 0.0–0.2)

## 2024-12-05 NOTE — Progress Notes (Signed)
 Assisted to bedside commode and then walked in hallway about 50 feet round trip with one assist.  Denied any dizziness.

## 2024-12-05 NOTE — Progress Notes (Signed)
*  PRELIMINARY RESULTS* Echocardiogram 2D Echocardiogram has been performed.  Teresa Aida PARAS 12/05/2024, 11:25 AM

## 2024-12-05 NOTE — Progress Notes (Signed)
 Patient's case was discussed with the hospitalist. 79 year old female s/p fall with a displaced right proximal humerus fracture. Skin is intact and the patient is reported to be neurovascularly intact median, radial, ulnar nerve distributions with 2+ radial pulse.  She may be treated non operatively in a sling, non weight bearing and may follow up in my clinic in 1-2 weeks for repeat x-rays.  Rankin Pizza, MD

## 2024-12-06 LAB — URINE CULTURE: Culture: >=100000 — AB

## 2024-12-06 MED ORDER — APIXABAN 5 MG PO TABS
5.0000 mg | ORAL_TABLET | Freq: Two times a day (BID) | ORAL | Status: DC
  Administered 2024-12-06 – 2024-12-07 (×3): 5 mg via ORAL
  Filled 2024-12-06 (×2): qty 1
  Filled 2024-12-06: qty 2

## 2024-12-06 MED ORDER — AMIODARONE HCL 200 MG PO TABS
200.0000 mg | ORAL_TABLET | Freq: Every morning | ORAL | Status: DC
  Administered 2024-12-06 – 2024-12-07 (×2): 200 mg via ORAL
  Filled 2024-12-06 (×2): qty 1

## 2024-12-06 MED ORDER — LATANOPROST 0.005 % OP SOLN
1.0000 [drp] | Freq: Every evening | OPHTHALMIC | Status: DC
  Administered 2024-12-06: 1 [drp] via OPHTHALMIC
  Filled 2024-12-06: qty 2.5

## 2024-12-06 MED ORDER — OYSTER SHELL CALCIUM/D3 500-5 MG-MCG PO TABS
1.0000 | ORAL_TABLET | Freq: Every day | ORAL | Status: DC
  Administered 2024-12-07: 1 via ORAL
  Filled 2024-12-06: qty 1

## 2024-12-06 MED ORDER — SODIUM BICARBONATE 650 MG PO TABS
650.0000 mg | ORAL_TABLET | Freq: Every day | ORAL | Status: DC
  Administered 2024-12-06 – 2024-12-07 (×2): 650 mg via ORAL
  Filled 2024-12-06 (×2): qty 1

## 2024-12-06 MED ORDER — OLMESARTAN MEDOXOMIL-HCTZ 40-25 MG PO TABS: 1.0000 | ORAL_TABLET | Freq: Every morning | ORAL | Status: DC

## 2024-12-06 MED ORDER — DILTIAZEM HCL ER 120 MG PO CP24
120.0000 mg | ORAL_CAPSULE | Freq: Every day | ORAL | Status: DC
  Administered 2024-12-06 – 2024-12-07 (×2): 120 mg via ORAL
  Filled 2024-12-06 (×5): qty 1

## 2024-12-06 MED ORDER — OXYCODONE HCL 5 MG PO TABS
5.0000 mg | ORAL_TABLET | ORAL | Status: DC | PRN
  Administered 2024-12-07 (×2): 5 mg via ORAL
  Filled 2024-12-06 (×2): qty 1

## 2024-12-06 NOTE — Progress Notes (Addendum)
 " Progress Note   Patient: Vanessa Anderson FMW:[id number] DOB: 22-Jul-1946 DOA: 12/03/2024     2 DOS: the patient was seen and examined on 12/06/2024   Brief hospital course: CADENCE MINTON is a 79 y.o. female with medical history significant for paroxysmal atrial fibrillation on Eliquis , hyperlipidemia, hypertension, hypothyroidism, history of complex atypical endometrial hyperplasia followed by gynecology, presents to the ER after a fall on black ice this evening.  Patient sustained closed nondisplaced fracture of surgical neck of right humerus.  Right arm sling placed.  Patient is admitted to [facility] service for further management.  Assessment and Plan: Closed nondisplaced fracture of right humerus neck Status post fall on ice- Continue pain control. I discussed with Dr. Teresa over phone 12/05/24, advised nonsurgical intervention, nonweightbearing, continue sling, follow-up in 1 to 2 weeks with orthopedic clinic.  Syncope/presyncope: Head CT unremarkable. Orthostatic vitals negative. Echocardiogram-EF 60-65%, mild aortic stenosis. PT OT evaluation advised home health PT. Continue fall precautions.  E.coli UTI- Continue rocephin  therapy. Sensitivities reviewed.  Persistent A-fib- Eliquis  therapy resumed. Continue telemetry monitoring.  Prediabetes: A1c 5.7. Diet controlled.  Hypothyroidism resumed home Synthroid .  Generalized weakness- PT OT advised home health. Patient wishes to go to his son's place.  History of complex atypical endometrial hyperplasia followed by gynecology.  Obesity class I: BMI 34.75. Diet, exercise and weight reduction advised.      Out of bed to chair. Incentive spirometry. Nursing supportive care. Fall, aspiration precautions. Diet:  Diet Orders (From admission, onward)     Start     Ordered   12/04/24 0219  Diet Heart Room service appropriate? Yes; Fluid consistency: Thin  Diet effective now       Question Answer Comment  Room service  appropriate? Yes   Fluid consistency: Thin      12/04/24 0218           DVT prophylaxis: heparin  injection 5,000 Units Start: 12/04/24 2200  Level of care: Telemetry   Code Status: Full Code  Subjective: Patient is seen and examined today morning. Feels better, does have right shoulder pain.  Eating fair.  Physical Exam: Vitals:   12/05/24 0545 12/05/24 1401 12/05/24 1955 12/06/24 0524  BP:  (!) 148/61 (!) 134/54 (!) 138/59  Pulse:   60 63  Resp:   14 14  Temp: 99.2 F (37.3 C) 98.5 F (36.9 C) 98.3 F (36.8 C) 98.5 F (36.9 C)  TempSrc: Oral Oral    SpO2: 97% 98% 93% 90%  Weight:      Height:        General - Elderly obese African-American female, no apparent distress HEENT - PERRLA, EOMI, atraumatic head, non tender sinuses. Lung - Clear, basal rales, no rhonchi, wheezes. Heart - S1, S2 heard, no murmurs, rubs, trace pedal edema. Abdomen - Soft, non tender obese, bowel sounds good Neuro - Alert, awake and oriented x 3, non focal exam. Skin - Warm and dry.  Right arm sling noted  Data Reviewed:      Latest Ref Rng & Units 12/05/2024    8:41 AM 12/04/2024    7:53 AM 12/03/2024   11:05 PM  CBC  WBC 4.0 - 10.5 K/uL 14.6  18.9  22.4   Hemoglobin 12.0 - 15.0 g/dL 9.5  88.2  87.8   Hematocrit 36.0 - 46.0 % 30.0  37.0  38.8   Platelets 150 - 400 K/uL 212  300  267       Latest Ref Rng & Units 12/05/2024  8:41 AM 12/04/2024    7:53 AM 12/03/2024   11:05 PM  BMP  Glucose 70 - 99 mg/dL 96  863  839   BUN 8 - 23 mg/dL 40  31  27   Creatinine 0.44 - 1.00 mg/dL 7.37  8.10  8.28   Sodium 135 - 145 mmol/L 139  141  139   Potassium 3.5 - 5.1 mmol/L 5.2  5.1  4.8   Chloride 98 - 111 mmol/L 108  109  107   CO2 22 - 32 mmol/L 22  18  17    Calcium  8.9 - 10.3 mg/dL 8.7  9.4  9.3    ECHOCARDIOGRAM COMPLETE Result Date: 12/05/2024    ECHOCARDIOGRAM REPORT   Patient Name:   Vanessa Anderson Date of Exam: 12/05/2024 Medical Rec #:  [id number]       Height:       62.0 in  Accession #:    [id number]      Weight:       190.0 lb Date of Birth:  [birth year]      BSA:          1.870 m Patient Age:    12 years        BP:           151/61 mmHg Patient Gender: F               HR:           65 bpm. Exam Location:  [facility] Procedure: 2D Echo, Cardiac Doppler, Color Doppler and Strain Analysis (Both            Spectral and Color Flow Doppler were utilized during procedure). Indications:    Syncope R55  History:        Patient has prior history of Echocardiogram examinations, most                 recent 03/29/2016. Arrythmias:Atrial Fibrillation; Risk                 Factors:Hypertension.  Sonographer:    Aida Pizza RCS Referring Phys: [id number] CONCEPCION RISER  Sonographer Comments: Global longitudinal strain was attempted. IMPRESSIONS  1. Left ventricular ejection fraction, by estimation, is 60 to 65%. The left ventricle has normal function. The left ventricle has no regional wall motion abnormalities. Left ventricular diastolic parameters were normal.  2. Right ventricular systolic function is normal. The right ventricular size is normal. Tricuspid regurgitation signal is inadequate for assessing PA pressure.  3. Left atrial size was moderately dilated.  4. A small pericardial effusion is present. The pericardial effusion is posterior to the left ventricle.  5. The mitral valve is grossly normal. Trivial mitral valve regurgitation.  6. The aortic valve is tricuspid. Aortic valve regurgitation is not visualized. Mild aortic valve stenosis. Aortic valve mean gradient measures 14.0 mmHg. Dimenionless index 0.49.  7. The inferior vena cava is normal in size with <50% respiratory variability, suggesting right atrial pressure of 8 mmHg. Comparison(s): Prior images unable to be directly viewed. FINDINGS  Left Ventricle: Left ventricular ejection fraction, by estimation, is 60 to 65%. The left ventricle has normal function. The left ventricle has no regional wall motion abnormalities. The  left ventricular internal cavity size was normal in size. There is  no left ventricular hypertrophy. Left ventricular diastolic parameters were normal. Right Ventricle: The right ventricular size is normal. No increase in right ventricular wall thickness. Right ventricular systolic function is normal. Tricuspid regurgitation  signal is inadequate for assessing PA pressure. Left Atrium: Left atrial size was moderately dilated. Right Atrium: Right atrial size was normal in size. Pericardium: A small pericardial effusion is present. The pericardial effusion is posterior to the left ventricle. Mitral Valve: The mitral valve is grossly normal. Trivial mitral valve regurgitation. Tricuspid Valve: The tricuspid valve is grossly normal. Tricuspid valve regurgitation is trivial. Aortic Valve: The aortic valve is tricuspid. There is mild aortic valve annular calcification. Aortic valve regurgitation is not visualized. Mild aortic stenosis is present. Aortic valve mean gradient measures 14.0 mmHg. Aortic valve peak gradient measures 29.2 mmHg. Aortic valve area, by VTI measures 1.39 cm. Pulmonic Valve: The pulmonic valve was grossly normal. Pulmonic valve regurgitation is mild. Aorta: The aortic root is normal in size and structure. Venous: The inferior vena cava is normal in size with less than 50% respiratory variability, suggesting right atrial pressure of 8 mmHg. IAS/Shunts: No atrial level shunt detected by color flow Doppler. Additional Comments: 3D was performed not requiring image post processing on an independent workstation and was indeterminate.  LEFT VENTRICLE PLAX 2D LVIDd:         4.70 cm   Diastology LVIDs:         2.30 cm   LV e' medial:    10.10 cm/s LV PW:         1.10 cm   LV E/e' medial:  10.0 LV IVS:        0.90 cm   LV e' lateral:   10.70 cm/s LVOT diam:     1.90 cm   LV E/e' lateral: 9.4 LV SV:         76 LV SV Index:   41 LVOT Area:     2.84 cm  LEFT ATRIUM             Index        RIGHT ATRIUM            Index LA diam:        4.40 cm 2.35 cm/m   RA Area:     13.30 cm LA Vol (A2C):   39.8 ml 21.28 ml/m  RA Volume:   31.00 ml  16.57 ml/m LA Vol (A4C):   77.5 ml 41.43 ml/m LA Biplane Vol: 61.7 ml 32.99 ml/m  AORTIC VALVE AV Area (Vmax):    1.53 cm AV Area (Vmean):   1.50 cm AV Area (VTI):     1.39 cm AV Vmax:           270.00 cm/s AV Vmean:          173.000 cm/s AV VTI:            0.547 m AV Peak Grad:      29.2 mmHg AV Mean Grad:      14.0 mmHg LVOT Vmax:         146.00 cm/s LVOT Vmean:        91.700 cm/s LVOT VTI:          0.268 m LVOT/AV VTI ratio: 0.49  AORTA Ao Root diam: 2.50 cm MITRAL VALVE MV Area (PHT): 3.21 cm     SHUNTS MV Decel Time: 236 msec     Systemic VTI:  0.27 m MV E velocity: 101.00 cm/s  Systemic Diam: 1.90 cm MV A velocity: 93.20 cm/s MV E/A ratio:  1.08 Jayson Sierras MD Electronically signed by Jayson Sierras MD Signature Date/Time: 12/05/2024/12:44:13 PM    Final  Family Communication: Discussed with patient, understand and agree. All questions answered.  Disposition: Status is: Inpatient Remains inpatient appropriate because: Pain control, PT OT, home health  Planned Discharge Destination: Home with Home Health     Time spent: 43 minutes  Author: Concepcion Riser, MD 12/06/2024 1:02 PM Secure chat 7am to 7pm For on call review [email or web address].    "

## 2024-12-06 NOTE — Progress Notes (Signed)
 Walked in hallway with 1 assist about 50 feet round trip.  Now sitting in chair.  Sling adjusted. No numbness or tingling in right arm/hand, warm with stong radial pulse.  Did not feel dizzy when walking just complained of feeling tired.

## 2024-12-07 LAB — CBC
HCT: 27.5 % — ABNORMAL LOW (ref 36.0–46.0)
Hemoglobin: 8.6 g/dL — ABNORMAL LOW (ref 12.0–15.0)
MCH: 29.5 pg (ref 26.0–34.0)
MCHC: 31.3 g/dL (ref 30.0–36.0)
MCV: 94.2 fL (ref 80.0–100.0)
Platelets: 208 10*3/uL (ref 150–400)
RBC: 2.92 MIL/uL — ABNORMAL LOW (ref 3.87–5.11)
RDW: 14.3 % (ref 11.5–15.5)
WBC: 12.6 10*3/uL — ABNORMAL HIGH (ref 4.0–10.5)
nRBC: 0 % (ref 0.0–0.2)

## 2024-12-07 LAB — BASIC METABOLIC PANEL WITH GFR
Anion gap: 10 (ref 5–15)
BUN: 40 mg/dL — ABNORMAL HIGH (ref 8–23)
CO2: 22 mmol/L (ref 22–32)
Calcium: 8.6 mg/dL — ABNORMAL LOW (ref 8.9–10.3)
Chloride: 109 mmol/L (ref 98–111)
Creatinine, Ser: 2.09 mg/dL — ABNORMAL HIGH (ref 0.44–1.00)
GFR, Estimated: 24 mL/min — ABNORMAL LOW
Glucose, Bld: 101 mg/dL — ABNORMAL HIGH (ref 70–99)
Potassium: 4.9 mmol/L (ref 3.5–5.1)
Sodium: 141 mmol/L (ref 135–145)

## 2024-12-07 MED ORDER — OXYCODONE-ACETAMINOPHEN 5-325 MG PO TABS: 1.0000 | ORAL_TABLET | Freq: Four times a day (QID) | ORAL | 0 refills | Status: AC | PRN

## 2024-12-07 NOTE — Progress Notes (Signed)
 Pt slept well throughout the night, pt had no complaints of pain, however pt is very unsteady on her feet and noted to be leaning backwards while using bedside commode and leaning backwards while standing, recommending a PT consult before pt discharges to assess ambulation and balance.

## 2024-12-07 NOTE — Plan of Care (Signed)
  Problem: Education: Goal: Knowledge of General Education information will improve Description: Including pain rating scale, medication(s)/side effects and non-pharmacologic comfort measures Outcome: Progressing   Problem: Clinical Measurements: Goal: Ability to maintain clinical measurements within normal limits will improve Outcome: Progressing Goal: Will remain free from infection Outcome: Progressing Goal: Diagnostic test results will improve Outcome: Progressing Goal: Respiratory complications will improve Outcome: Progressing Goal: Cardiovascular complication will be avoided Outcome: Progressing   Problem: Nutrition: Goal: Adequate nutrition will be maintained Outcome: Progressing   Problem: Coping: Goal: Level of anxiety will decrease Outcome: Progressing   Problem: Safety: Goal: Ability to remain free from injury will improve Outcome: Progressing   Problem: Skin Integrity: Goal: Risk for impaired skin integrity will decrease Outcome: Progressing

## 2024-12-08 DIAGNOSIS — E66811 Obesity, class 1: Secondary | ICD-10-CM | POA: Insufficient documentation

## 2024-12-08 DIAGNOSIS — B9620 Unspecified Escherichia coli [E. coli] as the cause of diseases classified elsewhere: Secondary | ICD-10-CM | POA: Insufficient documentation

## 2024-12-09 ENCOUNTER — Telehealth: Payer: Self-pay

## 2024-12-09 NOTE — Transitions of Care (Post Inpatient/ED Visit) (Signed)
" ° °  12/09/2024  Name: MALAYZIA LAFORTE MRN: [id number] DOB: 11/29/1945  Today's TOC FU Call Status: Today's TOC FU Call Status:: Unsuccessful Call (1st Attempt) Unsuccessful Call (1st Attempt) Date: 12/09/24  Attempted to reach the patient regarding the most recent Inpatient/ED visit.  Follow Up Plan: Additional outreach attempts will be made to reach the patient to complete the Transitions of Care (Post Inpatient/ED visit) call.   Karys Meckley J. Tammie Yanda RN, MSN [facility], [facility] RN Care Manager Direct Dial: [phone]  Fax: [phone] Website: [email or web address]   "

## 2024-12-16 ENCOUNTER — Encounter: Admitting: Orthopedic Surgery

## 2024-12-25 ENCOUNTER — Ambulatory Visit (HOSPITAL_COMMUNITY)

## 2025-05-10 ENCOUNTER — Ambulatory Visit: Admitting: Nurse Practitioner
